# Patient Record
Sex: Female | Born: 1945 | Race: Black or African American | Hispanic: No | Smoking: Never smoker
Health system: Southern US, Community
[De-identification: ages and names within clinical notes are randomized; demographics above are authoritative.]

## PROBLEM LIST (undated history)

## (undated) DIAGNOSIS — T7840XA Allergy, unspecified, initial encounter: Secondary | ICD-10-CM

## (undated) DIAGNOSIS — M1990 Unspecified osteoarthritis, unspecified site: Secondary | ICD-10-CM

## (undated) DIAGNOSIS — Z87442 Personal history of urinary calculi: Secondary | ICD-10-CM

## (undated) DIAGNOSIS — M549 Dorsalgia, unspecified: Secondary | ICD-10-CM

## (undated) DIAGNOSIS — M069 Rheumatoid arthritis, unspecified: Secondary | ICD-10-CM

## (undated) DIAGNOSIS — G8929 Other chronic pain: Secondary | ICD-10-CM

## (undated) DIAGNOSIS — M810 Age-related osteoporosis without current pathological fracture: Secondary | ICD-10-CM

## (undated) HISTORY — PX: OTHER SURGICAL HISTORY: SHX169

## (undated) HISTORY — DX: Age-related osteoporosis without current pathological fracture: M81.0

## (undated) HISTORY — PX: NECK SURGERY: SHX720

## (undated) HISTORY — DX: Rheumatoid arthritis, unspecified: M06.9

## (undated) HISTORY — PX: EPIDURAL BLOCK INJECTION: SHX1516

## (undated) HISTORY — PX: ROTATOR CUFF REPAIR: SHX139

## (undated) HISTORY — DX: Other chronic pain: G89.29

## (undated) HISTORY — DX: Allergy, unspecified, initial encounter: T78.40XA

---
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
## 2006-03-31 ENCOUNTER — Ambulatory Visit: Payer: Self-pay | Admitting: Unknown Physician Specialty

## 2008-06-17 ENCOUNTER — Ambulatory Visit: Payer: Self-pay | Admitting: Unknown Physician Specialty

## 2008-06-24 ENCOUNTER — Ambulatory Visit: Payer: Self-pay | Admitting: Unknown Physician Specialty

## 2010-02-20 ENCOUNTER — Ambulatory Visit: Payer: Self-pay | Admitting: Rheumatology

## 2010-08-03 ENCOUNTER — Ambulatory Visit: Payer: Self-pay | Admitting: Rheumatology

## 2014-07-29 DIAGNOSIS — M810 Age-related osteoporosis without current pathological fracture: Secondary | ICD-10-CM | POA: Insufficient documentation

## 2014-07-29 DIAGNOSIS — M069 Rheumatoid arthritis, unspecified: Secondary | ICD-10-CM | POA: Insufficient documentation

## 2015-02-12 ENCOUNTER — Ambulatory Visit: Payer: Self-pay | Admitting: Podiatry

## 2016-01-07 ENCOUNTER — Ambulatory Visit: Payer: Medicare HMO | Attending: Rheumatology | Admitting: Occupational Therapy

## 2016-01-07 DIAGNOSIS — M25631 Stiffness of right wrist, not elsewhere classified: Secondary | ICD-10-CM | POA: Diagnosis present

## 2016-01-07 DIAGNOSIS — M79641 Pain in right hand: Secondary | ICD-10-CM | POA: Insufficient documentation

## 2016-01-07 DIAGNOSIS — M6281 Muscle weakness (generalized): Secondary | ICD-10-CM | POA: Insufficient documentation

## 2016-01-07 DIAGNOSIS — M25641 Stiffness of right hand, not elsewhere classified: Secondary | ICD-10-CM | POA: Insufficient documentation

## 2016-01-07 NOTE — Patient Instructions (Signed)
Pt was ed on using heat Tendon glides, THumb PA and RA Opposition picking up 1 cm foam block alternate digits RD of digits AROM of all - 2 x day - 8 reps   Pt ed on joint protection principles and AE - info and hand out provided

## 2016-01-07 NOTE — Therapy (Signed)
[facility] [address] [place], [address] Phone: [phone]   Fax:  [phone]  Occupational Therapy Treatment  Patient Details  Name: Casey [name] MRN: [id number] Date of Birth: [birth year] Referring Provider: Gavin Potters  Encounter Date: 01/07/2016      OT End of Session - 01/07/16 1759    Visit Number 1   Number of Visits 4   Date for OT Re-Evaluation 02/04/16   OT Start Time 1046   OT Stop Time 1144   OT Time Calculation (min) 58 min   Activity Tolerance Patient tolerated treatment well   Behavior During Therapy [facility] for tasks assessed/performed      No past medical history on file.  No past surgical history on file.  There were no vitals filed for this visit.  Visit Diagnosis:  Pain of right hand - Plan: Ot plan of care cert/re-cert  Stiffness of hand joint, right - Plan: Ot plan of care cert/re-cert  Muscle weakness - Plan: Ot plan of care cert/re-cert  Stiffness of right wrist joint - Plan: Ot plan of care cert/re-cert      Subjective Assessment - 01/07/16 1417    Subjective  I maybe had it more than 4 months - my husband just past away last month - pain in R wrist and knuckles and pins and needles in fingers tips - hard time opening my fingers, make fist   Patient Stated Goals Decrease pain , increase my motion in R hand             [facility] OT Assessment - 01/07/16 0001    Assessment   Diagnosis RA with R hand/wrist pain , CTS   Referring Provider Gavin Potters   Onset Date 08/29/15   Assessment Pt refer by DR Gavin Potters for OT - pt had R wrist and hand pain - CTS i nR -    Home  Environment   Lives With Alone   Prior Function   Level of Independence Independent   Leisure Pt retired, R hand dominant - husband was in rest home but past away about month ago - pt likes to  play games on phone, some yard work, house work, cooking, read and go to gym 3 x wk    Strength   Right Hand Grip  (lbs) 5   Right Hand Lateral Pinch 9 lbs   Right Hand 3 Point Pinch 6 lbs   Left Hand Grip (lbs) 25   Left Hand Lateral Pinch 12 lbs   Left Hand 3 Point Pinch 11 lbs   Right Hand AROM   R Thumb MCP 0-60 60 Degrees   R Thumb IP 0-80 50 Degrees   R Index  MCP 0-90 80 Degrees  -55   R Index PIP 0-100 80 Degrees   R Long  MCP 0-90 75 Degrees  -45   R Long PIP 0-100 82 Degrees   R Ring  MCP 0-90 70 Degrees  -45   R Ring PIP 0-100 85 Degrees   R Ring DIP 0-70 --   R Little  MCP 0-90 85 Degrees  -45   R Little PIP 0-100 80 Degrees   Left Hand AROM   L Thumb MCP 0-60 60 Degrees   L Thumb IP 0-80 75 Degrees   L Index  MCP 0-90 85 Degrees   L Index PIP 0-100 95 Degrees   L Long  MCP 0-90 85 Degrees   L Long PIP  0-100 95 Degrees   L Ring  MCP 0-90 88 Degrees   L Ring PIP 0-100 90 Degrees   L Little  MCP 0-90 85 Degrees   L Little PIP 0-100 95 Degrees          parafin use on R hand prior to review of HEP - decrease pain and pt show increase digits flexion and extention afterwards   ed on joint protection and AE                   OT Education - 01/07/16 1759    Education provided Yes   Education Details HEP and modifications of tasks   Person(s) Educated Patient   Methods Explanation;Demonstration;Tactile cues;Verbal cues;Handout   Comprehension Verbal cues required;Returned demonstration;Verbalized understanding          OT Short Term Goals - 01/07/16 1804    OT SHORT TERM GOAL #1   Title Pt pain on PRWHE improve with at least 15 points    Baseline Pain on PRWHE 40/50   Time 3   Period Weeks   Status New   OT SHORT TERM GOAL #2   Title Digits AROM in flexion and extention in R hand improve with 5-10 degrees to increase grip and able to put hand in pocket   Baseline See flowsheet   Time 3   Period Weeks   Status New           OT Long Term Goals - 01/07/16 1805    OT LONG TERM GOAL #1   Title Pt to be ind in HEP to increase ROM in R hand , wear  splint if needed to prevent worsening of subluxation of tendons over MC's no   Baseline no  knowledge    Time 4   Period Weeks   Status New   OT LONG TERM GOAL #2   Title Pt verbalize 3 joint protectoin principles and AE for pt to use to decrease pain and iincrease use of hands at home    Baseline very little knowledge    Time 4   Period Weeks   Status New   OT LONG TERM GOAL #3   Title Grip strengtht improve with 3-5 lbs  to increase use to cut food, write, use mouse on computer    Baseline Grip 5 lbs on R - and PRWHE at eval for funciton 19/50   Time 4   Period Weeks   Status New               Plan - 01/07/16 1800    Clinical Impression Statement Pt present with history of RA - R hand and wrist worse than L - pt present with increase pain in R hand more than L ,  decrease wrist ROM , decrease digits flexion and extention - subluxation of tendons over MC's on R and 2nd on L - decrease grip strength - and limited use of hand in ADL's and IADL's - pt do have  CTS on the R  and has brace to wear but not very complaint with it    Pt will benefit from skilled therapeutic intervention in order to improve on the following deficits (Retired) Decreased range of motion;Impaired flexibility;Impaired sensation;Decreased knowledge of use of DME;Impaired UE functional use;Pain;Decreased strength   Rehab Potential Fair   OT Frequency 1x / week   OT Duration 4 weeks   OT Treatment/Interventions Self-care/ADL training;Moist Heat;Parrafin;Therapeutic exercise;Patient/family education;Splinting;Manual Therapy;DME and/or AE instruction;Passive range of motion  Plan  assess if did modifications of task -AE and joint protection - and reviewed HEP   OT Home Exercise Plan see pt instruction    Consulted and Agree with Plan of Care Patient          G-Codes - Jan 21, 2016 1811    Functional Assessment Tool Used PRWHE , ROM , grip strenght , Pain , clincal judgement    Functional Limitation Self care    Self Care Current Status (J1884) At least 40 percent but less than 60 percent impaired, limited or restricted   Self Care Goal Status (Z6606) At least 20 percent but less than 40 percent impaired, limited or restricted      Problem List There are no active problems to display for this patient.   Oletta Cohn OTR/L,CLT Jan 21, 2016, 6:14 PM  [facility] [address], [place], [address] Phone: [phone]   Fax:  [phone]  Name: Casey [name] MRN: [id number] Date of Birth: 1946/07/14

## 2016-01-14 ENCOUNTER — Ambulatory Visit: Payer: Medicare HMO | Admitting: Occupational Therapy

## 2016-01-14 DIAGNOSIS — M79641 Pain in right hand: Secondary | ICD-10-CM

## 2016-01-14 DIAGNOSIS — M25631 Stiffness of right wrist, not elsewhere classified: Secondary | ICD-10-CM

## 2016-01-14 DIAGNOSIS — M25641 Stiffness of right hand, not elsewhere classified: Secondary | ICD-10-CM

## 2016-01-14 DIAGNOSIS — M6281 Muscle weakness (generalized): Secondary | ICD-10-CM

## 2016-01-14 NOTE — Therapy (Signed)
[facility] [address] [place], [address] Phone: [phone]   Fax:  [phone]  Occupational Therapy Treatment  Patient Details  Name: Casey [name] MRN: [id number] Date of Birth: May 17, 1946 Referring Provider: Gavin Potters  Encounter Date: 01/14/2016      OT End of Session - 01/14/16 1315    OT Start Time 1101   OT Stop Time 1143   OT Time Calculation (min) 42 min   Activity Tolerance Patient tolerated treatment well   Behavior During Therapy [facility] for tasks assessed/performed      No past medical history on file.  No past surgical history on file.  There were no vitals filed for this visit.  Visit Diagnosis:  Pain of right hand  Stiffness of hand joint, right  Muscle weakness  Stiffness of right wrist joint      Subjective Assessment - 01/14/16 1118    Subjective  Pain is better -but today about 5/10 - but I can tell my fingers move better - I can straighten it more and bend it better - I did warm water and then exercises - wrist still bother me    Patient Stated Goals Decrease pain , increase my motion in R hand    Currently in Pain? Yes   Pain Score 5    Pain Location Wrist   Pain Orientation Right   Pain Descriptors / Indicators Aching   Pain Onset More than a month ago            [facility] OT Assessment - 01/14/16 0001    Right Hand AROM   R Index  MCP 0-90 80 Degrees  -10   R Index PIP 0-100 90 Degrees   R Long  MCP 0-90 75 Degrees  0   R Long PIP 0-100 90 Degrees   R Ring  MCP 0-90 70 Degrees  0   R Ring PIP 0-100 90 Degrees   R Little  MCP 0-90 85 Degrees  0   R Little PIP 0-100 90 Degrees                  OT Treatments/Exercises (OP) - 01/14/16 0001    Wrist Exercises   Other wrist exercises Med N glide add to HEP - no change in sensation at tips when done    Hand Exercises   Other Hand Exercises Measurements taken - see flowsheet - when coming in -  Tapping of digits extention , RD of digits , tendon glides - oppostion    Other Hand Exercises --   RUE Paraffin   Number Minutes Paraffin 10 Minutes   RUE Paraffin Location Hand   Comments At [facility] to decrease pain and increase ROM prior to ROM    Manual Therapy   Manual therapy comments Soft tissue mobs to volar forearm and wrist - as well as over palm - with tool 2 and 4 Graston tools -                 OT Education - 01/14/16 1122    Education provided Yes   Education Details HEP and jooint protection    Person(s) Educated Patient   Methods Explanation;Demonstration;Tactile cues;Verbal cues   Comprehension Verbal cues required;Returned demonstration;Verbalized understanding          OT Short Term Goals - 01/14/16 1358    OT SHORT TERM GOAL #1   Title Pt pain on PRWHE improve with at least 15  points    Baseline Pain on PRWHE 40/50   Time 2   Period Weeks   Status On-going   OT SHORT TERM GOAL #2   Title Digits AROM in flexion and extention in R hand improve with 5-10 degrees to increase grip and able to put hand in pocket   Baseline See flowsheet   Status Achieved           OT Long Term Goals - 01/14/16 1359    OT LONG TERM GOAL #1   Title Pt to be ind in HEP to increase ROM in R hand , wear splint if needed to prevent worsening of subluxation of tendons over MC's no   Baseline prgressing    Time 3   Period Weeks   Status On-going   OT LONG TERM GOAL #2   Title Pt verbalize 3 joint protectoin principles and AE for pt to use to decrease pain and iincrease use of hands at home    Baseline progressing    Time 3   Period Weeks   Status On-going   OT LONG TERM GOAL #3   Title Grip strengtht improve with 3-5 lbs  to increase use to cut food, write, use mouse on computer    Baseline grip improve to 10 - but not assess if increase use    Time 3   Period Weeks   Status On-going               Plan - 01/14/16 1316    Clinical Impression Statement Pt  made great progress in digits extention coming in this date - and some progress in PIPI's - pt grip improve to 10 from 5 lbs at eval cont to have decrease wrist AROM and pain - but improvement in ROM , grip and pain -    Pt will benefit from skilled therapeutic intervention in order to improve on the following deficits (Retired) Decreased range of motion;Impaired flexibility;Impaired sensation;Decreased knowledge of use of DME;Impaired UE functional use;Pain;Decreased strength   Rehab Potential Fair   OT Frequency 1x / week   OT Duration 4 weeks   OT Treatment/Interventions Self-care/ADL training;Moist Heat;Parrafin;Therapeutic exercise;Patient/family education;Splinting;Manual Therapy;DME and/or AE instruction;Passive range of motion   Plan assess progress and pain - using joint proteciton    OT Home Exercise Plan see pt instruction    Consulted and Agree with Plan of Care Patient        Problem List There are no active problems to display for this patient.   Oletta Cohn OTR/L,CLT  01/14/2016, 2:01 PM  [facility] SPORTS MEDICINE [address], [place], [address] Phone: [phone]   Fax:  [phone]  Name: ALLISYN KUNZ MRN: [id number] Date of Birth: 02/25/1946

## 2016-01-14 NOTE — Patient Instructions (Signed)
Same as before - but add Med N glide  And reinforce implementing joint protection and AE use to decrease degeneration of joints - and further subluxation of MC's

## 2016-01-21 ENCOUNTER — Ambulatory Visit: Payer: Medicare HMO | Admitting: Occupational Therapy

## 2016-01-21 DIAGNOSIS — M6281 Muscle weakness (generalized): Secondary | ICD-10-CM

## 2016-01-21 DIAGNOSIS — M79641 Pain in right hand: Secondary | ICD-10-CM

## 2016-01-21 DIAGNOSIS — M25641 Stiffness of right hand, not elsewhere classified: Secondary | ICD-10-CM

## 2016-01-21 NOTE — Therapy (Signed)
[place] [facility] [address], [place], [address] Phone: [phone]   Fax:  [phone]  Occupational Therapy Treatment  Patient Details  Name: Casey [name] MRN: [id number] Date of Birth: 1946/04/01 Referring Provider: Jefm Bryant  Encounter Date: 01/21/2016      OT End of Session - 01/21/16 1635    Visit Number 3   Number of Visits 3   Date for OT Re-Evaluation 01/21/16   OT Start Time 5929   OT Stop Time 1633   OT Time Calculation (min) 45 min   Activity Tolerance Patient tolerated treatment well   Behavior During Therapy [facility] for tasks assessed/performed      No past medical history on file.  No past surgical history on file.  There were no vitals filed for this visit.  Visit Diagnosis:  Pain of right hand  Stiffness of hand joint, right  Muscle weakness      Subjective Assessment - 01/21/16 1607    Subjective  Fingers are more straight - wrist better today - think it is the warmer weather - still some numbness but lower - I try and use my hands different    Patient Stated Goals Decrease pain , increase my motion in R hand    Currently in Pain? No/denies            [facility] OT Assessment - 01/21/16 0001    Strength   Right Hand Grip (lbs) 15   Left Hand Grip (lbs) 38   Right Hand AROM   R Index  MCP 0-90 90 Degrees   R Index PIP 0-100 95 Degrees   R Long  MCP 0-90 85 Degrees   R Long PIP 0-100 90 Degrees   R Ring  MCP 0-90 90 Degrees   R Ring PIP 0-100 95 Degrees   R Little  MCP 0-90 80 Degrees   R Little PIP 0-100 90 Degrees                  OT Treatments/Exercises (OP) - 01/21/16 0001    ADLs   ADL Comments PRWHE for pain done    Wrist Exercises   Other wrist exercises gentle PROM for wrist extetntion - but not progress    Other wrist exercises MEd N glides - pt was doing last step wrong - reviewed 5 reps    Hand Exercises   Other Hand Exercises Reviewed HEP for  tendon glides , RD of digits - and tapping  - unable to do 5th    Other Hand Exercises see flowsheet  for grip strength    RUE Paraffin   Number Minutes Paraffin 10 Minutes   RUE Paraffin Location Hand   Comments at [facility] to increase motions nad decreasepain    Splinting   Splinting Fitted with wrist splint to use for CT at night time , driving , computer - that is when numbness happend - pt report feels muc hbetter                 OT Education - 01/21/16 1635    Education provided Yes   Education Details HEP   Person(s) Educated Patient   Methods Explanation;Demonstration;Tactile cues;Verbal cues   Comprehension Verbal cues required;Returned demonstration;Verbalized understanding          OT Short Term Goals - 01/21/16 1630    OT SHORT TERM GOAL #1   Title Pt pain on PRWHE improve with at least 15 points  Baseline Pain improve to 23/50; was 40/50 at eval    Status Achieved   OT SHORT TERM GOAL #2   Title Digits AROM in flexion and extention in R hand improve with 5-10 degrees to increase grip and able to put hand in pocket   Status Achieved           OT Long Term Goals - 01/21/16 1632    OT LONG TERM GOAL #1   Title Pt to be ind in HEP to increase ROM in R hand , wear splint if needed to prevent worsening of subluxation of tendons over MC's no   Status Achieved   OT LONG TERM GOAL #2   Title Pt verbalize 3 joint protectoin principles and AE for pt to use to decrease pain and iincrease use of hands at home    Status Achieved   OT LONG TERM GOAL #3   Title Grip strengtht improve with 3-5 lbs  to increase use to cut food, write, use mouse on computer    Status Achieved               Plan - 01/21/16 1848    Clinical Impression Statement Pt made great progress in pain , ROM for R hand digits extention - as well as grip strength in bilateral hands - pt ed on joint protection principles and modifications of taks - pt met all goals and discharge with home  program to prevent further suluxation of digits , maintain and increase ROM    Pt will benefit from skilled therapeutic intervention in order to improve on the following deficits (Retired) Decreased range of motion;Impaired flexibility;Impaired sensation;Decreased knowledge of use of DME;Impaired UE functional use;Pain;Decreased strength   Rehab Potential Fair   OT Treatment/Interventions Self-care/ADL training;Moist Heat;Parrafin;Therapeutic exercise;Patient/family education;Splinting;Manual Therapy;DME and/or AE instruction;Passive range of motion   Plan discharge with Home program    OT Home Exercise Plan see pt instruction    Consulted and Agree with Plan of Care Patient        Problem List There are no active problems to display for this patient.   Rosalyn Gess  OTR/L,CLT   01/21/2016, 6:50 PM  [place] [facility] [address], [place], [address] Phone: [phone]   Fax:  [phone]  Name: Casey [name] MRN: [id number] Date of Birth: [birth year]

## 2016-01-21 NOTE — Patient Instructions (Signed)
Same HEP and cont with joint protection and modifcations -AE   Splint for R wrist - computer , driving , sleeping

## 2016-06-22 ENCOUNTER — Other Ambulatory Visit: Payer: Self-pay | Admitting: Rheumatology

## 2016-06-22 DIAGNOSIS — M545 Low back pain: Secondary | ICD-10-CM

## 2016-07-02 ENCOUNTER — Ambulatory Visit
Admission: RE | Admit: 2016-07-02 | Discharge: 2016-07-02 | Disposition: A | Discharge status: Home / Self Care | Payer: Medicare HMO | Source: Ambulatory Visit | Attending: Rheumatology | Admitting: Rheumatology

## 2016-07-02 ENCOUNTER — Encounter: Payer: Self-pay | Admitting: Radiology

## 2016-07-02 DIAGNOSIS — M47816 Spondylosis without myelopathy or radiculopathy, lumbar region: Secondary | ICD-10-CM | POA: Insufficient documentation

## 2016-07-02 DIAGNOSIS — M545 Low back pain: Secondary | ICD-10-CM | POA: Diagnosis present

## 2016-07-02 DIAGNOSIS — M5126 Other intervertebral disc displacement, lumbar region: Secondary | ICD-10-CM | POA: Diagnosis not present

## 2016-07-02 DIAGNOSIS — M4806 Spinal stenosis, lumbar region: Secondary | ICD-10-CM | POA: Insufficient documentation

## 2017-05-09 DIAGNOSIS — M7062 Trochanteric bursitis, left hip: Secondary | ICD-10-CM | POA: Diagnosis not present

## 2017-05-09 DIAGNOSIS — M25552 Pain in left hip: Secondary | ICD-10-CM | POA: Diagnosis not present

## 2017-05-09 DIAGNOSIS — M25562 Pain in left knee: Secondary | ICD-10-CM | POA: Diagnosis not present

## 2017-06-07 DIAGNOSIS — M940 Chondrocostal junction syndrome [Tietze]: Secondary | ICD-10-CM | POA: Diagnosis not present

## 2017-06-07 DIAGNOSIS — R079 Chest pain, unspecified: Secondary | ICD-10-CM | POA: Diagnosis not present

## 2017-06-26 DIAGNOSIS — Z79899 Other long term (current) drug therapy: Secondary | ICD-10-CM | POA: Diagnosis not present

## 2017-06-26 DIAGNOSIS — M0579 Rheumatoid arthritis with rheumatoid factor of multiple sites without organ or systems involvement: Secondary | ICD-10-CM | POA: Diagnosis not present

## 2017-08-17 DIAGNOSIS — Z79899 Other long term (current) drug therapy: Secondary | ICD-10-CM | POA: Diagnosis not present

## 2017-08-17 DIAGNOSIS — Z8669 Personal history of other diseases of the nervous system and sense organs: Secondary | ICD-10-CM | POA: Diagnosis not present

## 2017-08-17 DIAGNOSIS — Z791 Long term (current) use of non-steroidal anti-inflammatories (NSAID): Secondary | ICD-10-CM | POA: Diagnosis not present

## 2017-08-17 DIAGNOSIS — M0579 Rheumatoid arthritis with rheumatoid factor of multiple sites without organ or systems involvement: Secondary | ICD-10-CM | POA: Diagnosis not present

## 2017-08-17 DIAGNOSIS — Z972 Presence of dental prosthetic device (complete) (partial): Secondary | ICD-10-CM | POA: Diagnosis not present

## 2017-08-17 DIAGNOSIS — Z0000 Encounter for general adult medical examination without abnormal findings: Secondary | ICD-10-CM | POA: Diagnosis not present

## 2017-08-17 DIAGNOSIS — K08109 Complete loss of teeth, unspecified cause, unspecified class: Secondary | ICD-10-CM | POA: Diagnosis not present

## 2017-08-17 DIAGNOSIS — Z7952 Long term (current) use of systemic steroids: Secondary | ICD-10-CM | POA: Diagnosis not present

## 2017-08-17 DIAGNOSIS — Z7983 Long term (current) use of bisphosphonates: Secondary | ICD-10-CM | POA: Diagnosis not present

## 2017-08-17 DIAGNOSIS — M818 Other osteoporosis without current pathological fracture: Secondary | ICD-10-CM | POA: Diagnosis not present

## 2017-08-25 DIAGNOSIS — M533 Sacrococcygeal disorders, not elsewhere classified: Secondary | ICD-10-CM | POA: Diagnosis not present

## 2017-09-05 DIAGNOSIS — K5730 Diverticulosis of large intestine without perforation or abscess without bleeding: Secondary | ICD-10-CM | POA: Diagnosis not present

## 2017-09-05 DIAGNOSIS — Z86010 Personal history of colonic polyps: Secondary | ICD-10-CM | POA: Diagnosis not present

## 2017-09-05 DIAGNOSIS — K5520 Angiodysplasia of colon without hemorrhage: Secondary | ICD-10-CM | POA: Diagnosis not present

## 2017-09-13 DIAGNOSIS — M0579 Rheumatoid arthritis with rheumatoid factor of multiple sites without organ or systems involvement: Secondary | ICD-10-CM | POA: Diagnosis not present

## 2017-09-13 DIAGNOSIS — Z79899 Other long term (current) drug therapy: Secondary | ICD-10-CM | POA: Diagnosis not present

## 2017-09-19 DIAGNOSIS — G8929 Other chronic pain: Secondary | ICD-10-CM | POA: Diagnosis not present

## 2017-09-19 DIAGNOSIS — M545 Low back pain: Secondary | ICD-10-CM | POA: Diagnosis not present

## 2017-09-19 DIAGNOSIS — M0579 Rheumatoid arthritis with rheumatoid factor of multiple sites without organ or systems involvement: Secondary | ICD-10-CM | POA: Diagnosis not present

## 2017-09-19 DIAGNOSIS — M25562 Pain in left knee: Secondary | ICD-10-CM | POA: Diagnosis not present

## 2017-12-21 DIAGNOSIS — M0579 Rheumatoid arthritis with rheumatoid factor of multiple sites without organ or systems involvement: Secondary | ICD-10-CM | POA: Diagnosis not present

## 2018-02-26 DIAGNOSIS — 419620001 Death: Secondary | SNOMED CT | POA: Diagnosis not present

## 2018-02-26 DEATH — deceased

## 2018-03-13 DIAGNOSIS — Z1231 Encounter for screening mammogram for malignant neoplasm of breast: Secondary | ICD-10-CM | POA: Diagnosis not present

## 2018-04-03 DIAGNOSIS — M0579 Rheumatoid arthritis with rheumatoid factor of multiple sites without organ or systems involvement: Secondary | ICD-10-CM | POA: Diagnosis not present

## 2018-04-05 DIAGNOSIS — G8929 Other chronic pain: Secondary | ICD-10-CM | POA: Diagnosis not present

## 2018-04-05 DIAGNOSIS — M25552 Pain in left hip: Secondary | ICD-10-CM | POA: Diagnosis not present

## 2018-04-05 DIAGNOSIS — M25562 Pain in left knee: Secondary | ICD-10-CM | POA: Diagnosis not present

## 2018-04-05 DIAGNOSIS — M0579 Rheumatoid arthritis with rheumatoid factor of multiple sites without organ or systems involvement: Secondary | ICD-10-CM | POA: Diagnosis not present

## 2018-04-05 DIAGNOSIS — Z79899 Other long term (current) drug therapy: Secondary | ICD-10-CM | POA: Diagnosis not present

## 2018-05-23 DIAGNOSIS — K644 Residual hemorrhoidal skin tags: Secondary | ICD-10-CM | POA: Diagnosis not present

## 2018-05-23 DIAGNOSIS — M069 Rheumatoid arthritis, unspecified: Secondary | ICD-10-CM | POA: Diagnosis not present

## 2018-05-24 DIAGNOSIS — Z79899 Other long term (current) drug therapy: Secondary | ICD-10-CM | POA: Diagnosis not present

## 2018-05-24 DIAGNOSIS — Z791 Long term (current) use of non-steroidal anti-inflammatories (NSAID): Secondary | ICD-10-CM | POA: Diagnosis not present

## 2018-05-24 DIAGNOSIS — Z7952 Long term (current) use of systemic steroids: Secondary | ICD-10-CM | POA: Diagnosis not present

## 2018-05-24 DIAGNOSIS — G8929 Other chronic pain: Secondary | ICD-10-CM | POA: Diagnosis not present

## 2018-05-24 DIAGNOSIS — R69 Illness, unspecified: Secondary | ICD-10-CM | POA: Diagnosis not present

## 2018-05-24 DIAGNOSIS — Z809 Family history of malignant neoplasm, unspecified: Secondary | ICD-10-CM | POA: Diagnosis not present

## 2018-05-24 DIAGNOSIS — M069 Rheumatoid arthritis, unspecified: Secondary | ICD-10-CM | POA: Diagnosis not present

## 2018-07-03 DIAGNOSIS — H903 Sensorineural hearing loss, bilateral: Secondary | ICD-10-CM | POA: Diagnosis not present

## 2018-07-03 DIAGNOSIS — H6122 Impacted cerumen, left ear: Secondary | ICD-10-CM | POA: Diagnosis not present

## 2018-07-15 DIAGNOSIS — M549 Dorsalgia, unspecified: Secondary | ICD-10-CM | POA: Diagnosis not present

## 2018-07-15 DIAGNOSIS — S52515A Nondisplaced fracture of left radial styloid process, initial encounter for closed fracture: Secondary | ICD-10-CM | POA: Diagnosis not present

## 2018-07-15 DIAGNOSIS — S3991XA Unspecified injury of abdomen, initial encounter: Secondary | ICD-10-CM | POA: Diagnosis not present

## 2018-07-15 DIAGNOSIS — R918 Other nonspecific abnormal finding of lung field: Secondary | ICD-10-CM | POA: Diagnosis not present

## 2018-07-15 DIAGNOSIS — R079 Chest pain, unspecified: Secondary | ICD-10-CM | POA: Diagnosis not present

## 2018-07-15 DIAGNOSIS — M25532 Pain in left wrist: Secondary | ICD-10-CM | POA: Diagnosis not present

## 2018-07-15 DIAGNOSIS — S63071A Subluxation of distal end of right ulna, initial encounter: Secondary | ICD-10-CM | POA: Diagnosis not present

## 2018-07-15 DIAGNOSIS — S299XXA Unspecified injury of thorax, initial encounter: Secondary | ICD-10-CM | POA: Diagnosis not present

## 2018-07-15 DIAGNOSIS — S52511D Displaced fracture of right radial styloid process, subsequent encounter for closed fracture with routine healing: Secondary | ICD-10-CM | POA: Diagnosis not present

## 2018-07-15 DIAGNOSIS — S79912A Unspecified injury of left hip, initial encounter: Secondary | ICD-10-CM | POA: Diagnosis not present

## 2018-07-15 DIAGNOSIS — M25542 Pain in joints of left hand: Secondary | ICD-10-CM | POA: Diagnosis not present

## 2018-07-15 DIAGNOSIS — S022XXA Fracture of nasal bones, initial encounter for closed fracture: Secondary | ICD-10-CM | POA: Diagnosis not present

## 2018-07-15 DIAGNOSIS — Y33XXXA Other specified events, undetermined intent, initial encounter: Secondary | ICD-10-CM | POA: Diagnosis not present

## 2018-07-15 DIAGNOSIS — S3993XA Unspecified injury of pelvis, initial encounter: Secondary | ICD-10-CM | POA: Diagnosis not present

## 2018-07-15 DIAGNOSIS — M25531 Pain in right wrist: Secondary | ICD-10-CM | POA: Diagnosis not present

## 2018-07-15 DIAGNOSIS — S0081XA Abrasion of other part of head, initial encounter: Secondary | ICD-10-CM | POA: Diagnosis not present

## 2018-07-15 DIAGNOSIS — M542 Cervicalgia: Secondary | ICD-10-CM | POA: Diagnosis not present

## 2018-07-15 DIAGNOSIS — M7989 Other specified soft tissue disorders: Secondary | ICD-10-CM | POA: Diagnosis not present

## 2018-07-15 DIAGNOSIS — S52514A Nondisplaced fracture of right radial styloid process, initial encounter for closed fracture: Secondary | ICD-10-CM | POA: Diagnosis not present

## 2018-07-15 DIAGNOSIS — S0990XA Unspecified injury of head, initial encounter: Secondary | ICD-10-CM | POA: Diagnosis not present

## 2018-07-15 DIAGNOSIS — M546 Pain in thoracic spine: Secondary | ICD-10-CM | POA: Diagnosis not present

## 2018-07-15 DIAGNOSIS — J9811 Atelectasis: Secondary | ICD-10-CM | POA: Diagnosis not present

## 2018-07-16 DIAGNOSIS — T1490XA Injury, unspecified, initial encounter: Secondary | ICD-10-CM | POA: Diagnosis not present

## 2018-07-16 DIAGNOSIS — S52514A Nondisplaced fracture of right radial styloid process, initial encounter for closed fracture: Secondary | ICD-10-CM | POA: Diagnosis not present

## 2018-07-16 DIAGNOSIS — S299XXA Unspecified injury of thorax, initial encounter: Secondary | ICD-10-CM | POA: Diagnosis not present

## 2018-07-16 DIAGNOSIS — E119 Type 2 diabetes mellitus without complications: Secondary | ICD-10-CM | POA: Diagnosis not present

## 2018-07-16 DIAGNOSIS — M769 Unspecified enthesopathy, lower limb, excluding foot: Secondary | ICD-10-CM | POA: Diagnosis not present

## 2018-07-16 DIAGNOSIS — M25531 Pain in right wrist: Secondary | ICD-10-CM | POA: Diagnosis not present

## 2018-07-16 DIAGNOSIS — S022XXA Fracture of nasal bones, initial encounter for closed fracture: Secondary | ICD-10-CM | POA: Diagnosis not present

## 2018-07-16 DIAGNOSIS — M542 Cervicalgia: Secondary | ICD-10-CM | POA: Diagnosis not present

## 2018-07-16 DIAGNOSIS — M25542 Pain in joints of left hand: Secondary | ICD-10-CM | POA: Diagnosis not present

## 2018-07-16 DIAGNOSIS — R918 Other nonspecific abnormal finding of lung field: Secondary | ICD-10-CM | POA: Diagnosis not present

## 2018-07-16 DIAGNOSIS — S069X1A Unspecified intracranial injury with loss of consciousness of 30 minutes or less, initial encounter: Secondary | ICD-10-CM | POA: Diagnosis not present

## 2018-07-16 DIAGNOSIS — S63071A Subluxation of distal end of right ulna, initial encounter: Secondary | ICD-10-CM | POA: Diagnosis not present

## 2018-07-16 DIAGNOSIS — S3993XA Unspecified injury of pelvis, initial encounter: Secondary | ICD-10-CM | POA: Diagnosis not present

## 2018-07-16 DIAGNOSIS — M810 Age-related osteoporosis without current pathological fracture: Secondary | ICD-10-CM | POA: Diagnosis not present

## 2018-07-16 DIAGNOSIS — M0579 Rheumatoid arthritis with rheumatoid factor of multiple sites without organ or systems involvement: Secondary | ICD-10-CM | POA: Diagnosis not present

## 2018-07-16 DIAGNOSIS — S0081XA Abrasion of other part of head, initial encounter: Secondary | ICD-10-CM | POA: Diagnosis not present

## 2018-07-16 DIAGNOSIS — M069 Rheumatoid arthritis, unspecified: Secondary | ICD-10-CM | POA: Diagnosis not present

## 2018-07-16 DIAGNOSIS — J9811 Atelectasis: Secondary | ICD-10-CM | POA: Diagnosis not present

## 2018-07-16 DIAGNOSIS — Y33XXXA Other specified events, undetermined intent, initial encounter: Secondary | ICD-10-CM | POA: Diagnosis not present

## 2018-07-16 DIAGNOSIS — M7120 Synovial cyst of popliteal space [Baker], unspecified knee: Secondary | ICD-10-CM | POA: Diagnosis not present

## 2018-07-16 DIAGNOSIS — M25532 Pain in left wrist: Secondary | ICD-10-CM | POA: Diagnosis not present

## 2018-07-16 DIAGNOSIS — S3992XA Unspecified injury of lower back, initial encounter: Secondary | ICD-10-CM | POA: Diagnosis not present

## 2018-07-16 DIAGNOSIS — S79912A Unspecified injury of left hip, initial encounter: Secondary | ICD-10-CM | POA: Diagnosis not present

## 2018-07-16 DIAGNOSIS — S52515A Nondisplaced fracture of left radial styloid process, initial encounter for closed fracture: Secondary | ICD-10-CM | POA: Diagnosis not present

## 2018-07-16 DIAGNOSIS — M546 Pain in thoracic spine: Secondary | ICD-10-CM | POA: Diagnosis not present

## 2018-07-16 DIAGNOSIS — M7989 Other specified soft tissue disorders: Secondary | ICD-10-CM | POA: Diagnosis not present

## 2018-07-16 DIAGNOSIS — S52511D Displaced fracture of right radial styloid process, subsequent encounter for closed fracture with routine healing: Secondary | ICD-10-CM | POA: Diagnosis not present

## 2018-07-16 DIAGNOSIS — S0990XA Unspecified injury of head, initial encounter: Secondary | ICD-10-CM | POA: Diagnosis not present

## 2018-07-16 DIAGNOSIS — R079 Chest pain, unspecified: Secondary | ICD-10-CM | POA: Diagnosis not present

## 2018-07-16 DIAGNOSIS — S3991XA Unspecified injury of abdomen, initial encounter: Secondary | ICD-10-CM | POA: Diagnosis not present

## 2018-07-17 DIAGNOSIS — T1490XA Injury, unspecified, initial encounter: Secondary | ICD-10-CM | POA: Diagnosis not present

## 2018-07-17 DIAGNOSIS — M0579 Rheumatoid arthritis with rheumatoid factor of multiple sites without organ or systems involvement: Secondary | ICD-10-CM | POA: Diagnosis not present

## 2018-07-18 DIAGNOSIS — M0579 Rheumatoid arthritis with rheumatoid factor of multiple sites without organ or systems involvement: Secondary | ICD-10-CM | POA: Diagnosis not present

## 2018-07-18 DIAGNOSIS — T1490XA Injury, unspecified, initial encounter: Secondary | ICD-10-CM | POA: Diagnosis not present

## 2018-07-24 DIAGNOSIS — M069 Rheumatoid arthritis, unspecified: Secondary | ICD-10-CM | POA: Diagnosis not present

## 2018-07-24 DIAGNOSIS — Z888 Allergy status to other drugs, medicaments and biological substances status: Secondary | ICD-10-CM | POA: Diagnosis not present

## 2018-07-24 DIAGNOSIS — M25571 Pain in right ankle and joints of right foot: Secondary | ICD-10-CM | POA: Diagnosis not present

## 2018-07-24 DIAGNOSIS — Z79899 Other long term (current) drug therapy: Secondary | ICD-10-CM | POA: Diagnosis not present

## 2018-07-24 DIAGNOSIS — K644 Residual hemorrhoidal skin tags: Secondary | ICD-10-CM | POA: Diagnosis not present

## 2018-07-24 DIAGNOSIS — M7989 Other specified soft tissue disorders: Secondary | ICD-10-CM | POA: Diagnosis not present

## 2018-07-24 DIAGNOSIS — H25813 Combined forms of age-related cataract, bilateral: Secondary | ICD-10-CM | POA: Diagnosis not present

## 2018-07-24 DIAGNOSIS — S8011XA Contusion of right lower leg, initial encounter: Secondary | ICD-10-CM | POA: Diagnosis not present

## 2018-07-24 DIAGNOSIS — M1990 Unspecified osteoarthritis, unspecified site: Secondary | ICD-10-CM | POA: Diagnosis not present

## 2018-07-24 DIAGNOSIS — S8011XD Contusion of right lower leg, subsequent encounter: Secondary | ICD-10-CM | POA: Diagnosis not present

## 2018-07-25 DIAGNOSIS — M7989 Other specified soft tissue disorders: Secondary | ICD-10-CM | POA: Diagnosis not present

## 2018-07-25 DIAGNOSIS — R58 Hemorrhage, not elsewhere classified: Secondary | ICD-10-CM | POA: Diagnosis not present

## 2018-07-25 DIAGNOSIS — Z79899 Other long term (current) drug therapy: Secondary | ICD-10-CM | POA: Diagnosis not present

## 2018-07-25 DIAGNOSIS — M069 Rheumatoid arthritis, unspecified: Secondary | ICD-10-CM | POA: Diagnosis not present

## 2018-07-25 DIAGNOSIS — M79661 Pain in right lower leg: Secondary | ICD-10-CM | POA: Diagnosis not present

## 2018-07-25 DIAGNOSIS — M7981 Nontraumatic hematoma of soft tissue: Secondary | ICD-10-CM | POA: Diagnosis not present

## 2018-08-01 DIAGNOSIS — M25531 Pain in right wrist: Secondary | ICD-10-CM | POA: Diagnosis not present

## 2018-08-01 DIAGNOSIS — S52514A Nondisplaced fracture of right radial styloid process, initial encounter for closed fracture: Secondary | ICD-10-CM | POA: Diagnosis not present

## 2018-08-06 DIAGNOSIS — S52514A Nondisplaced fracture of right radial styloid process, initial encounter for closed fracture: Secondary | ICD-10-CM | POA: Insufficient documentation

## 2018-08-06 HISTORY — DX: Nondisplaced fracture of right radial styloid process, initial encounter for closed fracture: S52.514A

## 2018-08-09 DIAGNOSIS — S52514A Nondisplaced fracture of right radial styloid process, initial encounter for closed fracture: Secondary | ICD-10-CM | POA: Diagnosis not present

## 2018-08-24 DIAGNOSIS — H25811 Combined forms of age-related cataract, right eye: Secondary | ICD-10-CM | POA: Diagnosis not present

## 2018-08-28 DIAGNOSIS — S52514A Nondisplaced fracture of right radial styloid process, initial encounter for closed fracture: Secondary | ICD-10-CM | POA: Diagnosis not present

## 2018-10-02 DIAGNOSIS — Z79899 Other long term (current) drug therapy: Secondary | ICD-10-CM | POA: Diagnosis not present

## 2018-10-02 DIAGNOSIS — M542 Cervicalgia: Secondary | ICD-10-CM | POA: Diagnosis not present

## 2018-10-02 DIAGNOSIS — M0579 Rheumatoid arthritis with rheumatoid factor of multiple sites without organ or systems involvement: Secondary | ICD-10-CM | POA: Diagnosis not present

## 2018-10-04 DIAGNOSIS — M542 Cervicalgia: Secondary | ICD-10-CM | POA: Diagnosis not present

## 2018-10-04 DIAGNOSIS — Z79899 Other long term (current) drug therapy: Secondary | ICD-10-CM | POA: Diagnosis not present

## 2018-10-04 DIAGNOSIS — M0579 Rheumatoid arthritis with rheumatoid factor of multiple sites without organ or systems involvement: Secondary | ICD-10-CM | POA: Diagnosis not present

## 2018-10-04 DIAGNOSIS — M50321 Other cervical disc degeneration at C4-C5 level: Secondary | ICD-10-CM | POA: Diagnosis not present

## 2018-10-04 DIAGNOSIS — G8929 Other chronic pain: Secondary | ICD-10-CM | POA: Diagnosis not present

## 2018-10-04 DIAGNOSIS — M25562 Pain in left knee: Secondary | ICD-10-CM | POA: Diagnosis not present

## 2018-12-27 DIAGNOSIS — B300 Keratoconjunctivitis due to adenovirus: Secondary | ICD-10-CM | POA: Diagnosis not present

## 2019-01-02 DIAGNOSIS — H6122 Impacted cerumen, left ear: Secondary | ICD-10-CM | POA: Diagnosis not present

## 2019-01-02 DIAGNOSIS — H9190 Unspecified hearing loss, unspecified ear: Secondary | ICD-10-CM | POA: Diagnosis not present

## 2019-01-07 DIAGNOSIS — Z79899 Other long term (current) drug therapy: Secondary | ICD-10-CM | POA: Diagnosis not present

## 2019-01-07 DIAGNOSIS — M0579 Rheumatoid arthritis with rheumatoid factor of multiple sites without organ or systems involvement: Secondary | ICD-10-CM | POA: Diagnosis not present

## 2019-01-28 DIAGNOSIS — H10023 Other mucopurulent conjunctivitis, bilateral: Secondary | ICD-10-CM | POA: Diagnosis not present

## 2019-01-30 DIAGNOSIS — J019 Acute sinusitis, unspecified: Secondary | ICD-10-CM | POA: Diagnosis not present

## 2019-03-28 DIAGNOSIS — M0579 Rheumatoid arthritis with rheumatoid factor of multiple sites without organ or systems involvement: Secondary | ICD-10-CM | POA: Diagnosis not present

## 2019-03-28 DIAGNOSIS — Z79899 Other long term (current) drug therapy: Secondary | ICD-10-CM | POA: Diagnosis not present

## 2019-04-04 DIAGNOSIS — M0579 Rheumatoid arthritis with rheumatoid factor of multiple sites without organ or systems involvement: Secondary | ICD-10-CM | POA: Diagnosis not present

## 2019-04-18 DIAGNOSIS — R201 Hypoesthesia of skin: Secondary | ICD-10-CM | POA: Diagnosis not present

## 2019-04-18 DIAGNOSIS — R200 Anesthesia of skin: Secondary | ICD-10-CM | POA: Diagnosis not present

## 2019-04-18 DIAGNOSIS — R600 Localized edema: Secondary | ICD-10-CM | POA: Diagnosis not present

## 2019-04-24 DIAGNOSIS — R600 Localized edema: Secondary | ICD-10-CM | POA: Diagnosis not present

## 2019-04-25 DIAGNOSIS — M0579 Rheumatoid arthritis with rheumatoid factor of multiple sites without organ or systems involvement: Secondary | ICD-10-CM | POA: Diagnosis not present

## 2019-04-25 DIAGNOSIS — M7062 Trochanteric bursitis, left hip: Secondary | ICD-10-CM | POA: Diagnosis not present

## 2019-04-25 DIAGNOSIS — M25552 Pain in left hip: Secondary | ICD-10-CM | POA: Diagnosis not present

## 2019-04-25 DIAGNOSIS — Z79899 Other long term (current) drug therapy: Secondary | ICD-10-CM | POA: Diagnosis not present

## 2019-04-25 DIAGNOSIS — G8929 Other chronic pain: Secondary | ICD-10-CM | POA: Diagnosis not present

## 2019-04-30 DIAGNOSIS — M7062 Trochanteric bursitis, left hip: Secondary | ICD-10-CM | POA: Diagnosis not present

## 2019-04-30 DIAGNOSIS — M545 Low back pain: Secondary | ICD-10-CM | POA: Diagnosis not present

## 2019-05-03 DIAGNOSIS — M419 Scoliosis, unspecified: Secondary | ICD-10-CM | POA: Diagnosis not present

## 2019-05-03 DIAGNOSIS — Z79899 Other long term (current) drug therapy: Secondary | ICD-10-CM | POA: Diagnosis not present

## 2019-05-03 DIAGNOSIS — M5126 Other intervertebral disc displacement, lumbar region: Secondary | ICD-10-CM | POA: Diagnosis not present

## 2019-05-03 DIAGNOSIS — M069 Rheumatoid arthritis, unspecified: Secondary | ICD-10-CM | POA: Diagnosis not present

## 2019-05-03 DIAGNOSIS — M7060 Trochanteric bursitis, unspecified hip: Secondary | ICD-10-CM | POA: Diagnosis not present

## 2019-05-03 DIAGNOSIS — M545 Low back pain: Secondary | ICD-10-CM | POA: Diagnosis not present

## 2019-05-03 DIAGNOSIS — M5416 Radiculopathy, lumbar region: Secondary | ICD-10-CM | POA: Diagnosis not present

## 2019-05-03 DIAGNOSIS — I10 Essential (primary) hypertension: Secondary | ICD-10-CM | POA: Diagnosis not present

## 2019-05-03 DIAGNOSIS — R200 Anesthesia of skin: Secondary | ICD-10-CM | POA: Diagnosis not present

## 2019-05-03 DIAGNOSIS — M48061 Spinal stenosis, lumbar region without neurogenic claudication: Secondary | ICD-10-CM | POA: Diagnosis not present

## 2019-05-03 DIAGNOSIS — Z888 Allergy status to other drugs, medicaments and biological substances status: Secondary | ICD-10-CM | POA: Diagnosis not present

## 2019-05-03 DIAGNOSIS — R000 Tachycardia, unspecified: Secondary | ICD-10-CM | POA: Diagnosis not present

## 2019-05-04 DIAGNOSIS — M419 Scoliosis, unspecified: Secondary | ICD-10-CM | POA: Diagnosis not present

## 2019-05-04 DIAGNOSIS — M5126 Other intervertebral disc displacement, lumbar region: Secondary | ICD-10-CM | POA: Diagnosis not present

## 2019-05-04 DIAGNOSIS — M069 Rheumatoid arthritis, unspecified: Secondary | ICD-10-CM | POA: Diagnosis not present

## 2019-05-04 DIAGNOSIS — M5416 Radiculopathy, lumbar region: Secondary | ICD-10-CM | POA: Diagnosis not present

## 2019-05-04 DIAGNOSIS — M1990 Unspecified osteoarthritis, unspecified site: Secondary | ICD-10-CM | POA: Diagnosis not present

## 2019-05-04 DIAGNOSIS — R102 Pelvic and perineal pain: Secondary | ICD-10-CM | POA: Diagnosis not present

## 2019-05-04 DIAGNOSIS — M48061 Spinal stenosis, lumbar region without neurogenic claudication: Secondary | ICD-10-CM | POA: Diagnosis not present

## 2019-05-04 DIAGNOSIS — Z9289 Personal history of other medical treatment: Secondary | ICD-10-CM | POA: Diagnosis not present

## 2019-05-04 DIAGNOSIS — M545 Low back pain: Secondary | ICD-10-CM | POA: Diagnosis not present

## 2019-05-04 DIAGNOSIS — M7060 Trochanteric bursitis, unspecified hip: Secondary | ICD-10-CM | POA: Diagnosis not present

## 2019-05-09 DIAGNOSIS — M6281 Muscle weakness (generalized): Secondary | ICD-10-CM | POA: Diagnosis not present

## 2019-05-09 DIAGNOSIS — G8929 Other chronic pain: Secondary | ICD-10-CM | POA: Diagnosis not present

## 2019-05-09 DIAGNOSIS — M545 Low back pain: Secondary | ICD-10-CM | POA: Diagnosis not present

## 2019-05-14 DIAGNOSIS — M545 Low back pain: Secondary | ICD-10-CM | POA: Diagnosis not present

## 2019-05-14 DIAGNOSIS — G8929 Other chronic pain: Secondary | ICD-10-CM | POA: Diagnosis not present

## 2019-05-14 DIAGNOSIS — M6281 Muscle weakness (generalized): Secondary | ICD-10-CM | POA: Diagnosis not present

## 2019-05-20 DIAGNOSIS — Z79899 Other long term (current) drug therapy: Secondary | ICD-10-CM | POA: Diagnosis not present

## 2019-05-20 DIAGNOSIS — M1990 Unspecified osteoarthritis, unspecified site: Secondary | ICD-10-CM | POA: Diagnosis not present

## 2019-05-20 DIAGNOSIS — Z7983 Long term (current) use of bisphosphonates: Secondary | ICD-10-CM | POA: Diagnosis not present

## 2019-05-20 DIAGNOSIS — G8929 Other chronic pain: Secondary | ICD-10-CM | POA: Diagnosis not present

## 2019-05-20 DIAGNOSIS — M5126 Other intervertebral disc displacement, lumbar region: Secondary | ICD-10-CM | POA: Diagnosis not present

## 2019-05-20 DIAGNOSIS — M7989 Other specified soft tissue disorders: Secondary | ICD-10-CM | POA: Diagnosis not present

## 2019-05-20 DIAGNOSIS — M48061 Spinal stenosis, lumbar region without neurogenic claudication: Secondary | ICD-10-CM | POA: Diagnosis not present

## 2019-05-20 DIAGNOSIS — M545 Low back pain: Secondary | ICD-10-CM | POA: Diagnosis not present

## 2019-05-20 DIAGNOSIS — M069 Rheumatoid arthritis, unspecified: Secondary | ICD-10-CM | POA: Diagnosis not present

## 2019-05-22 DIAGNOSIS — M5136 Other intervertebral disc degeneration, lumbar region: Secondary | ICD-10-CM | POA: Diagnosis not present

## 2019-05-22 DIAGNOSIS — M5416 Radiculopathy, lumbar region: Secondary | ICD-10-CM | POA: Diagnosis not present

## 2019-05-22 DIAGNOSIS — M48062 Spinal stenosis, lumbar region with neurogenic claudication: Secondary | ICD-10-CM | POA: Diagnosis not present

## 2019-06-10 DIAGNOSIS — Z79899 Other long term (current) drug therapy: Secondary | ICD-10-CM | POA: Diagnosis not present

## 2019-06-10 DIAGNOSIS — Z791 Long term (current) use of non-steroidal anti-inflammatories (NSAID): Secondary | ICD-10-CM | POA: Diagnosis not present

## 2019-06-10 DIAGNOSIS — G8929 Other chronic pain: Secondary | ICD-10-CM | POA: Diagnosis not present

## 2019-06-10 DIAGNOSIS — Z809 Family history of malignant neoplasm, unspecified: Secondary | ICD-10-CM | POA: Diagnosis not present

## 2019-06-10 DIAGNOSIS — M0550 Rheumatoid polyneuropathy with rheumatoid arthritis of unspecified site: Secondary | ICD-10-CM | POA: Diagnosis not present

## 2019-06-10 DIAGNOSIS — Z7952 Long term (current) use of systemic steroids: Secondary | ICD-10-CM | POA: Diagnosis not present

## 2019-06-10 DIAGNOSIS — M1990 Unspecified osteoarthritis, unspecified site: Secondary | ICD-10-CM | POA: Diagnosis not present

## 2019-06-14 DIAGNOSIS — M5416 Radiculopathy, lumbar region: Secondary | ICD-10-CM | POA: Diagnosis not present

## 2019-06-14 DIAGNOSIS — M48062 Spinal stenosis, lumbar region with neurogenic claudication: Secondary | ICD-10-CM | POA: Diagnosis not present

## 2019-06-14 DIAGNOSIS — M5136 Other intervertebral disc degeneration, lumbar region: Secondary | ICD-10-CM | POA: Diagnosis not present

## 2019-07-05 DIAGNOSIS — M1712 Unilateral primary osteoarthritis, left knee: Secondary | ICD-10-CM | POA: Diagnosis not present

## 2019-07-05 DIAGNOSIS — M0579 Rheumatoid arthritis with rheumatoid factor of multiple sites without organ or systems involvement: Secondary | ICD-10-CM | POA: Diagnosis not present

## 2019-07-05 DIAGNOSIS — M25562 Pain in left knee: Secondary | ICD-10-CM | POA: Diagnosis not present

## 2019-07-05 DIAGNOSIS — M7732 Calcaneal spur, left foot: Secondary | ICD-10-CM | POA: Diagnosis not present

## 2019-07-24 ENCOUNTER — Emergency Department
Admission: EM | Admit: 2019-07-24 | Discharge: 2019-07-24 | Disposition: A | Discharge status: Home / Self Care | Payer: Medicare HMO | Attending: Emergency Medicine | Admitting: Emergency Medicine

## 2019-07-24 ENCOUNTER — Other Ambulatory Visit: Payer: Self-pay

## 2019-07-24 DIAGNOSIS — M5137 Other intervertebral disc degeneration, lumbosacral region: Secondary | ICD-10-CM | POA: Insufficient documentation

## 2019-07-24 DIAGNOSIS — M5441 Lumbago with sciatica, right side: Secondary | ICD-10-CM | POA: Insufficient documentation

## 2019-07-24 DIAGNOSIS — M5187 Other intervertebral disc disorders, lumbosacral region: Secondary | ICD-10-CM | POA: Diagnosis not present

## 2019-07-24 DIAGNOSIS — M5442 Lumbago with sciatica, left side: Secondary | ICD-10-CM | POA: Insufficient documentation

## 2019-07-24 DIAGNOSIS — M5489 Other dorsalgia: Secondary | ICD-10-CM | POA: Diagnosis not present

## 2019-07-24 DIAGNOSIS — G8929 Other chronic pain: Secondary | ICD-10-CM | POA: Diagnosis not present

## 2019-07-24 DIAGNOSIS — R52 Pain, unspecified: Secondary | ICD-10-CM | POA: Diagnosis not present

## 2019-07-24 DIAGNOSIS — M545 Low back pain: Secondary | ICD-10-CM | POA: Diagnosis not present

## 2019-07-24 MED ORDER — LIDOCAINE 5 % EX PTCH: 1.0000 | MEDICATED_PATCH | CUTANEOUS | 0 refills | Status: DC

## 2019-07-24 MED ORDER — KETOROLAC TROMETHAMINE 30 MG/ML IJ SOLN
30.0000 mg | Freq: Once | INTRAMUSCULAR | Status: AC
  Administered 2019-07-24: 30 mg via INTRAMUSCULAR
  Filled 2019-07-24: qty 1

## 2019-07-24 MED ORDER — ORPHENADRINE CITRATE 30 MG/ML IJ SOLN
60.0000 mg | INTRAMUSCULAR | Status: AC
Start: 2019-07-24 — End: 2019-07-24
  Administered 2019-07-24: 17:00:00 60 mg via INTRAMUSCULAR
  Filled 2019-07-24: qty 2

## 2019-07-24 NOTE — Discharge Instructions (Signed)
Your exam and previous XRs and MRIs reveal degenerative disc disease and spondylolisthesis. This causes the type of pain you are experiencing down the buttocks and legs. You should consider taking the previously prescribed meds, as directed. Follow-up with Dr. Sharlet Salina for ongoing management.

## 2019-07-24 NOTE — ED Notes (Addendum)
See triage note   Presents with lower back pain which moves into both buttocks   States hx of same   Describes pain as "spasm" like

## 2019-07-24 NOTE — ED Triage Notes (Signed)
Pt comes via EMS from home with c/o back pain. Pt states it hurts to sit and she feels better when she can lay flat or stand.  Pt states it hurts all over. Pt states this has been going on for about 4 months. Pt ambulatory to triage with steady gait.  Pt denies any recent injury or fall.

## 2019-07-26 ENCOUNTER — Emergency Department
Admission: EM | Admit: 2019-07-26 | Discharge: 2019-07-26 | Disposition: A | Discharge status: Home / Self Care | Payer: Medicare HMO | Attending: Emergency Medicine | Admitting: Emergency Medicine

## 2019-07-26 ENCOUNTER — Encounter: Payer: Self-pay | Admitting: Emergency Medicine

## 2019-07-26 ENCOUNTER — Other Ambulatory Visit: Payer: Self-pay

## 2019-07-26 DIAGNOSIS — R69 Illness, unspecified: Secondary | ICD-10-CM | POA: Diagnosis not present

## 2019-07-26 DIAGNOSIS — M545 Low back pain: Secondary | ICD-10-CM | POA: Insufficient documentation

## 2019-07-26 DIAGNOSIS — G8929 Other chronic pain: Secondary | ICD-10-CM | POA: Insufficient documentation

## 2019-07-26 DIAGNOSIS — R52 Pain, unspecified: Secondary | ICD-10-CM | POA: Diagnosis not present

## 2019-07-26 DIAGNOSIS — M5489 Other dorsalgia: Secondary | ICD-10-CM | POA: Diagnosis not present

## 2019-07-26 DIAGNOSIS — I10 Essential (primary) hypertension: Secondary | ICD-10-CM | POA: Diagnosis not present

## 2019-07-26 MED ORDER — LIDOCAINE 5 % EX PTCH
1.0000 | MEDICATED_PATCH | CUTANEOUS | Status: DC
  Administered 2019-07-26: 06:00:00 1 via TRANSDERMAL
  Filled 2019-07-26: qty 1

## 2019-07-26 MED ORDER — LIDOCAINE 5 % EX PTCH: 1.0000 | MEDICATED_PATCH | CUTANEOUS | 0 refills | Status: AC

## 2019-07-26 NOTE — Discharge Instructions (Signed)

## 2019-07-26 NOTE — ED Provider Notes (Signed)
[facility] Emergency Department Provider Note ____________________________________________  Time seen: 1625  I have reviewed the triage vital signs and the nursing notes.  HISTORY  Chief Complaint  Back Pain  HPI Casey [name] is a 73 y.o. female presents to the ED via EMS from home, for complaints of acute on chronic low back pain.  Patient describes pain to the lumbar sacral region, that is worse with attempts to lie flat or stand.  She has been under the care of [facility] rheumatology as well as pain management for her degenerative disc disease and rheumatoid arthritis.  Patient has been on medications including tramadol, hydrocodone, tizanidine, and prednisone.  She presents with these medications in a bag, and reports that she discontinued these medications and lieu of naturopathic medicines, due to concerns over possible dependency.  She denies any recent injury, trauma, or fall patient also denies any bladder or bowel incontinence, foot drop, or saddle anesthesia.  She presents to the ED for request for management of her increased acute pain.  Past Medical History:  Diagnosis Date  . Arthritis   . Back pain     There are no active problems to display for this patient.   Past Surgical History:  Procedure Laterality Date  . EPIDURAL BLOCK INJECTION    . NECK SURGERY      Prior to Admission medications   Medication Sig Start Date End Date Taking? Authorizing Provider  lidocaine (LIDODERM) 5 % Place 1 patch onto the skin daily for 10 days. Remove & Discard patch after 12 hours of wear 07/26/19 08/05/19  Loleta Rose, MD    Allergies Patient has no known allergies.  No family history on file.  Social History Social History   Tobacco Use  . Smoking status: Never Smoker  . Smokeless tobacco: Never Used  Substance Use Topics  . Alcohol use: Never    Frequency: Never  . Drug use: Never    Review of Systems  Constitutional: Negative for fever. Eyes:  Negative for visual changes. ENT: Negative for sore throat. Cardiovascular: Negative for chest pain. Respiratory: Negative for shortness of breath. Gastrointestinal: Negative for abdominal pain, vomiting and diarrhea. Genitourinary: Negative for dysuria. Musculoskeletal: Positive for back pain. Skin: Negative for rash. Neurological: Negative for headaches, focal weakness or numbness. ____________________________________________  PHYSICAL EXAM:  VITAL SIGNS: ED Triage Vitals  Enc Vitals Group     BP 07/24/19 1428 (!) 157/72     Pulse Rate 07/24/19 1428 100     Resp 07/24/19 1428 19     Temp 07/24/19 1428 98.8 F (37.1 C)     Temp src --      SpO2 07/24/19 1428 97 %     Weight 07/24/19 1425 150 lb (68 kg)     Height 07/24/19 1425 5\' 3"  (1.6 m)     Head Circumference --      Peak Flow --      Pain Score 07/24/19 1425 9     Pain Loc --      Pain Edu? --      Excl. in GC? --     Constitutional: Alert and oriented. Well appearing and in no distress.  Patient is standing and pacing the room, in obvious discomfort Head: Normocephalic and atraumatic. Cardiovascular: Normal rate, regular rhythm. Normal distal pulses. Respiratory: Normal respiratory effort. No wheezes/rales/rhonchi. Gastrointestinal: Soft and nontender. No distention. Musculoskeletal: Normal spinal alignment without midline tenderness, spasm, deformity, or step-off.  Patient is tender to palpation of  the lumbar sacral junction.  Nontender with normal range of motion in all extremities.  Neurologic:  Normal gait without ataxia. Normal speech and language. No gross focal neurologic deficits are appreciated. Skin:  Skin is warm, dry and intact. No rash noted. Psychiatric: Mood and affect are normal. Patient exhibits appropriate insight and judgment. ____________________________   RADIOLOGY  Chart Review ____________________________________________  PROCEDURES  Procedures Toradol 30 mg IM Norflex 60 mg  IM ____________________________________________  INITIAL IMPRESSION / ASSESSMENT AND PLAN / ED COURSE  SHAKOYA GILMORE was evaluated in Emergency Department on 07/26/2019 for the symptoms described in the history of present illness. She was evaluated in the context of the global COVID-19 pandemic, which necessitated consideration that the patient might be at risk for infection with the SARS-CoV-2 virus that causes COVID-19. Institutional protocols and algorithms that pertain to the evaluation of patients at risk for COVID-19 are in a state of rapid change based on information released by regulatory bodies including the CDC and federal and state organizations. These policies and algorithms were followed during the patient's care in the ED.  Patient with ED evaluation of acute on chronic low back pain.  I reviewed the patient's chart including her previous images which confirm a diagnosis of DDD of the lumbar sacral region as well as some spondylolisthesis.  I also discussed in some detail in length with the patient about the medications available to her and the usefulness of the medications for her condition.  Giving consideration to her concern for dependence on these medications I advised her to discuss with Dr. Sharlet Salina or Dr. Jefm Bryant, her concerns, and consider other interventions.  I also discussed with the patient is likely not a surgical candidate, and her continued use of pain modalities including epidural steroid injections may be her best option.  She verbalized understanding and thank me for the time and consideration given to her today.  She reports improvement of her symptoms at the time of discharge.  She is encouraged to return to the ED as needed. ____________________________________________  FINAL CLINICAL IMPRESSION(S) / ED DIAGNOSES  Final diagnoses:  DDD (degenerative disc disease), lumbosacral  Chronic midline low back pain with bilateral sciatica      Carmie End, Dannielle Karvonen,  PA-C 07/26/19 2345    Harvest Dark, MD 07/27/19 1436

## 2019-07-26 NOTE — ED Provider Notes (Signed)
[facility] Emergency Department Provider Note  ____________________________________________   First MD Initiated Contact with Patient 07/26/19 [phone]     (approximate)  I have reviewed the triage vital signs and the nursing notes.   HISTORY  Chief Complaint Back Pain    HPI Casey [name] is a 73 y.o. female with history of degenerative disc disease, arthritis, and chronic back pain.  She presents by EMS for acute on chronic severe low back pain down at the base of her lower back.  She says it feels like her usual back pain except that it is worse.  She normally takes hydrocodone when the pain is severe and said that it got worse tonight around 2 AM and was unable to go back to sleep.  She was given a total of fentanyl 150 mcg IV by EMS which made her pain go away completely.  She was saying that she was ready to go home before I was able to get in the room to see her.  She has had no loss of urinary control and no urinary retention.  Pain is worse with ambulation and nothing in particular makes it better except for the fentanyl she got by EMS.  She has been seen in the past in the emergency department for this issue as well as by a rheumatologist and other providers.  She was seen 2 days ago in flex and was given a Lidoderm prescription which she says she was unable to fill.  She was encouraged to go see Dr. Sharlet Salina with physiatry for further evaluation and treatment but she has not yet had the opportunity to do so.         Past Medical History:  Diagnosis Date   Arthritis    Back pain     There are no active problems to display for this patient.   Past Surgical History:  Procedure Laterality Date   EPIDURAL BLOCK INJECTION     NECK SURGERY      Prior to Admission medications   Medication Sig Start Date End Date Taking? Authorizing Provider  lidocaine (LIDODERM) 5 % Place 1 patch onto the skin daily for 10 days. Remove & Discard patch after 12  hours of wear 07/26/19 08/05/19  Hinda Kehr, MD    Allergies Patient has no known allergies.  No family history on file.  Social History Social History   Tobacco Use   Smoking status: Never Smoker   Smokeless tobacco: Never Used  Substance Use Topics   Alcohol use: Never    Frequency: Never   Drug use: Never    Review of Systems Constitutional: No fever/chills Cardiovascular: Denies chest pain. Respiratory: Denies shortness of breath. Gastrointestinal: No abdominal pain.   Genitourinary: Negative for dysuria.  No history of urinary incontinence nor urinary retention. Musculoskeletal: Acute on chronic low back pain. Integumentary: Negative for rash. Neurological: Negative for headaches, focal weakness or numbness.   ____________________________________________   PHYSICAL EXAM:  VITAL SIGNS: ED Triage Vitals  Enc Vitals Group     BP 07/26/19 0329 (!) 174/84     Pulse Rate 07/26/19 0329 90     Resp 07/26/19 0329 18     Temp 07/26/19 0329 97.9 F (36.6 C)     Temp Source 07/26/19 0329 Oral     SpO2 07/26/19 0329 99 %     Weight 07/26/19 0332 68 kg (150 lb)     Height 07/26/19 0332 1.6 m (5\' 3" )  Head Circumference --      Peak Flow --      Pain Score 07/26/19 0332 0     Pain Loc --      Pain Edu? --      Excl. in GC? --     Constitutional: Alert and oriented.  Appears uncomfortable but is not in acute distress at this time. Head: Atraumatic. Cardiovascular: Normal rate, regular rhythm. Good peripheral circulation. Respiratory: Normal respiratory effort.  No retractions. Musculoskeletal: No gross abnormalities identified in the lumbar spine.  No tenderness to palpation of the lumbar spine or of the paraspinal muscles. Neurologic:  Normal speech and language. No gross focal neurologic deficits are appreciated.  Skin:  Skin is warm, dry and intact. Psychiatric: Mood and affect are normal. Speech and behavior are  normal.  ____________________________________________   LABS (all labs ordered are listed, but only abnormal results are displayed)  Labs Reviewed - No data to display ____________________________________________  EKG  No indication for EKG ____________________________________________  RADIOLOGY I, Loleta Rose, personally viewed and evaluated these images (plain radiographs) as part of my medical decision making, as well as reviewing the written report by the radiologist.  ED MD interpretation: No indication for emergent imaging  Official radiology report(s): No results found.  ____________________________________________   PROCEDURES   Procedure(s) performed (including Critical Care):  Procedures   ____________________________________________   INITIAL IMPRESSION / MDM / ASSESSMENT AND PLAN / ED COURSE  As part of my medical decision making, I reviewed the following data within the electronic MEDICAL RECORD NUMBER Nursing notes reviewed and incorporated, Old chart reviewed, Notes from prior ED visits and [place] Controlled Substance Database   Patient has acute on chronic back pain which was relieved with fentanyl.  I looked back through the medical records including care everywhere and she has had numerous visits to multiple providers and hospitals for similar issues.  She has degenerative disc disease and chronic back pain that appears to have flared up tonight without any evidence of new traumatic injury.  She has no emergent neurological signs or symptoms.  No indication for further imaging tonight.  I explained that I cannot prescribe narcotics but I encouraged her to take her regular medications.  I observed her walking without any difficulties and she is neurologically intact on exam.  She is comfortable with the plan for follow-up.  I gave her a new prescription for Lidoderm patch since she says she was unable to fill the first 1 because it was prescribed by midlevel  provider.  I again encouraged her to go see Dr. Yves Dill and I gave my usual and customary return precautions.          ____________________________________________  FINAL CLINICAL IMPRESSION(S) / ED DIAGNOSES  Final diagnoses:  Chronic midline low back pain without sciatica     MEDICATIONS GIVEN DURING THIS VISIT:  Medications  lidocaine (LIDODERM) 5 % 1 patch (1 patch Transdermal Patch Applied 07/26/19 0626)     ED Discharge Orders         Ordered    lidocaine (LIDODERM) 5 %  Every 24 hours     07/26/19 0622          *Please note:  Casey [name] was evaluated in Emergency Department on 07/26/2019 for the symptoms described in the history of present illness. She was evaluated in the context of the global COVID-19 pandemic, which necessitated consideration that the patient might be at risk for infection with the SARS-CoV-2 virus that causes  COVID-19. Institutional protocols and algorithms that pertain to the evaluation of patients at risk for COVID-19 are in a state of rapid change based on information released by regulatory bodies including the CDC and federal and state organizations. These policies and algorithms were followed during the patient's care in the ED.  Some ED evaluations and interventions may be delayed as a result of limited staffing during the pandemic.*  Note:  This document was prepared using Dragon voice recognition software and may include unintentional dictation errors.   Loleta RoseForbach, Yahsir Wickens, MD 07/26/19 [id number]

## 2019-07-26 NOTE — ED Notes (Signed)
Pt given ice pack at this time.

## 2019-07-26 NOTE — ED Triage Notes (Addendum)
Pt presents to ED via EMS from home with c/o lower back pain.  Pt has degenerative disc disease and typically takes hydrocodone when her pain is severe. Pain worsened tonight around 2am and pt was unable to sleep. Pt given 150 mcg of fentanyl by EMS with good relief. Pt currently denies pain.

## 2019-07-26 NOTE — ED Notes (Signed)
Pt taken to the restroom. Tolerated well. Standby assistance needed.

## 2019-08-13 ENCOUNTER — Other Ambulatory Visit: Payer: Self-pay

## 2019-08-13 ENCOUNTER — Emergency Department: Payer: Medicare HMO

## 2019-08-13 ENCOUNTER — Encounter: Payer: Self-pay | Admitting: Emergency Medicine

## 2019-08-13 ENCOUNTER — Emergency Department
Admission: EM | Admit: 2019-08-13 | Discharge: 2019-08-13 | Disposition: A | Discharge status: Home / Self Care | Payer: Medicare HMO | Attending: Emergency Medicine | Admitting: Emergency Medicine

## 2019-08-13 DIAGNOSIS — M25561 Pain in right knee: Secondary | ICD-10-CM | POA: Insufficient documentation

## 2019-08-13 DIAGNOSIS — M7121 Synovial cyst of popliteal space [Baker], right knee: Secondary | ICD-10-CM | POA: Insufficient documentation

## 2019-08-13 DIAGNOSIS — M7989 Other specified soft tissue disorders: Secondary | ICD-10-CM | POA: Diagnosis not present

## 2019-08-13 MED ORDER — OXYCODONE-ACETAMINOPHEN 5-325 MG PO TABS
1.0000 | ORAL_TABLET | Freq: Once | ORAL | Status: AC
  Administered 2019-08-13: 1 via ORAL
  Filled 2019-08-13: qty 1

## 2019-08-13 MED ORDER — TRAMADOL HCL 50 MG PO TABS: 50.0000 mg | ORAL_TABLET | Freq: Four times a day (QID) | ORAL | 0 refills | Status: AC | PRN

## 2019-08-13 NOTE — ED Provider Notes (Signed)
[facility] Emergency Department Provider Note  ____________________________________________  Time seen: Approximately 8:26 PM  I have reviewed the triage vital signs and the nursing notes.   HISTORY  Chief Complaint Knee Pain    HPI Casey [name] is a 73 y.o. female presents to the emergency department with 7 out of 10 right knee pain. Patient has a history of right knee pain and receives cortisone injections periodically and would like an additional cortisone injection tonight. She denies falls or mechanisms of trauma.  She denies a history of smoking.  No prolonged immobilization or history of recent surgery.  She denies pleuritic chest pain or shortness of breath.  No other alleviating measures have been attempted.       Past Medical History:  Diagnosis Date  . Arthritis   . Back pain     There are no active problems to display for this patient.   Past Surgical History:  Procedure Laterality Date  . EPIDURAL BLOCK INJECTION    . NECK SURGERY      Prior to Admission medications   Medication Sig Start Date End Date Taking? Authorizing Provider  traMADol (ULTRAM) 50 MG tablet Take 1 tablet (50 mg total) by mouth every 6 (six) hours as needed for up to 3 days. 08/13/19 08/16/19  Orvil Feil, PA-C    Allergies Patient has no known allergies.  No family history on file.  Social History Social History   Tobacco Use  . Smoking status: Never Smoker  . Smokeless tobacco: Never Used  Substance Use Topics  . Alcohol use: Never    Frequency: Never  . Drug use: Never     Review of Systems  Constitutional: No fever/chills Eyes: No visual changes. No discharge ENT: No upper respiratory complaints. Cardiovascular: no chest pain. Respiratory: no cough. No SOB. Gastrointestinal: No abdominal pain.  No nausea, no vomiting.  No diarrhea.  No constipation. Musculoskeletal: Patient has right knee pain. Skin: Negative for rash, abrasions,  lacerations, ecchymosis. Neurological: Negative for headaches, focal weakness or numbness.  ____________________________________________   PHYSICAL EXAM:  VITAL SIGNS: ED Triage Vitals  Enc Vitals Group     BP 08/13/19 1952 (!) 176/79     Pulse Rate 08/13/19 1952 99     Resp 08/13/19 1952 18     Temp 08/13/19 1952 98.1 F (36.7 C)     Temp Source 08/13/19 1952 Oral     SpO2 08/13/19 1952 97 %     Weight 08/13/19 1953 140 lb (63.5 kg)     Height 08/13/19 1953 5\' 3"  (1.6 m)     Head Circumference --      Peak Flow --      Pain Score 08/13/19 1953 9     Pain Loc --      Pain Edu? --      Excl. in GC? --      Constitutional: Alert and oriented. Well appearing and in no acute distress. Eyes: Conjunctivae are normal. PERRL. EOMI. Head: Atraumatic. Cardiovascular: Normal rate, regular rhythm. Normal S1 and S2.  Good peripheral circulation. Respiratory: Normal respiratory effort without tachypnea or retractions. Lungs CTAB. Good air entry to the bases with no decreased or absent breath sounds. Gastrointestinal: Bowel sounds 4 quadrants. Soft and nontender to palpation. No guarding or rigidity. No palpable masses. No distention. No CVA tenderness. Musculoskeletal: Patient performs full range of motion at the right knee.  Suspect mild erythema of the right knee is secondary to use of  knee immobilizer.  Palpable dorsalis pedis pulse bilaterally and symmetrically. Neurologic:  Normal speech and language. No gross focal neurologic deficits are appreciated.  Skin:  Skin is warm, dry and intact. No rash noted. Psychiatric: Mood and affect are normal. Speech and behavior are normal. Patient exhibits appropriate insight and judgement.   ____________________________________________   LABS (all labs ordered are listed, but only abnormal results are displayed)  Labs Reviewed - No data to  display ____________________________________________  EKG   ____________________________________________  RADIOLOGY I personally viewed and evaluated these images as part of my medical decision making, as well as reviewing the written report by the radiologist.    US Venous Img Lower Unilateral Right  Result Date: 08/13/2019 CLINICAL DATA:  Right lower leg swelling for 1 day. No known injury. EXAM: RIGHT LOWER EXTREMITY VENOUS DOPPLER ULTRASOUND TECHNIQUE: Gray-scale sonography with graded compression, as well as color Doppler and duplex ultrasound were performed to evaluate the lower extremity deep venous systems from the level of the common femoral vein and including the common femoral, femoral, profunda femoral, popliteal and calf veins including the posterior tibial, peroneal and gastrocnemius veins when visible. The superficial great saphenous vein was also interrogated. Spectral Doppler was utilized to evaluate flow at rest and with distal augmentation maneuvers in the common femoral, femoral and popliteal veins. COMPARISON:  None. FINDINGS: Contralateral Common Femoral Vein: Respiratory phasicity is normal and symmetric with the symptomatic side. No evidence of thrombus. Normal compressibility. Common Femoral Vein: No evidence of thrombus. Normal compressibility, respiratory phasicity and response to augmentation. Saphenofemoral Junction: No evidence of thrombus. Normal compressibility and flow on color Doppler imaging. Profunda Femoral Vein: No evidence of thrombus. Normal compressibility and flow on color Doppler imaging. Femoral Vein: No evidence of thrombus. Normal compressibility, respiratory phasicity and response to augmentation. Popliteal Vein: No evidence of thrombus. Normal compressibility, respiratory phasicity and response to augmentation. Calf Veins: No evidence of thrombus. Normal compressibility and flow on color Doppler imaging. Superficial Great Saphenous Vein: No evidence of  thrombus. Normal compressibility. Venous Reflux:  None. Other Findings: Small Baker's cysts measuring 4.2 x 0.8 x 1.7 cm noted. IMPRESSION: No evidence of deep venous thrombosis. Small Baker's cyst. Electronically Signed   By: Drusilla Kanner M.D.   On: 08/13/2019 21:34    ____________________________________________    PROCEDURES  Procedure(s) performed:    Procedures    Medications  oxyCODONE-acetaminophen (PERCOCET/ROXICET) 5-325 MG per tablet 1 tablet (1 tablet Oral Given 08/13/19 2032)     ____________________________________________   INITIAL IMPRESSION / ASSESSMENT AND PLAN / ED COURSE  Pertinent labs & imaging results that were available during my care of the patient were reviewed by me and considered in my medical decision making (see chart for details).  Review of the [place] CSRS was performed in accordance of the [facility] prior to dispensing any controlled drugs.           Assessment and Plan:  Right knee pain 73 year old female presents to the emergency department with acute right knee pain that has been going on for the past 4 to 5 days.  Patient was able to perform full range of motion at the right knee.  Patient had some mild erythema overlying the right knee which I suspected was secondary to knee immobilizer use.  Venous ultrasound conducted in the emergency department revealed no evidence of thromboembolism.  Ultrasound did reveal findings consistent with a small Baker's cyst.  Patient was given a Percocet in the emergency department and she was discharged  with a short course of tramadol.  She was advised to follow-up with orthopedics as needed.  All patient questions were answered.    ____________________________________________  FINAL CLINICAL IMPRESSION(S) / ED DIAGNOSES  Final diagnoses:  Acute pain of right knee      NEW MEDICATIONS STARTED DURING THIS VISIT:  ED Discharge Orders         Ordered    traMADol (ULTRAM) 50 MG tablet  Every 6 hours  PRN     08/13/19 2153              This chart was dictated using voice recognition software/Dragon. Despite best efforts to proofread, errors can occur which can change the meaning. Any change was purely unintentional.    Karren Cobble 08/13/19 2202    Carrie Mew, MD 08/13/19 2350

## 2019-08-13 NOTE — ED Notes (Signed)
Pt assisted to bed by this RN and ultrasound tech. Pt unable to place any weight onto right leg. Pt requesting not to straighten leg completely.

## 2019-08-13 NOTE — ED Triage Notes (Signed)
Patient ambulatory to triage with steady gait, without difficulty or distress noted; pt reports rt knee pain this afternoon with no known injury; st "I just need an injection"

## 2019-08-15 DIAGNOSIS — M1711 Unilateral primary osteoarthritis, right knee: Secondary | ICD-10-CM | POA: Diagnosis not present

## 2019-08-15 DIAGNOSIS — M7121 Synovial cyst of popliteal space [Baker], right knee: Secondary | ICD-10-CM | POA: Diagnosis not present

## 2019-08-15 DIAGNOSIS — M25561 Pain in right knee: Secondary | ICD-10-CM | POA: Diagnosis not present

## 2019-08-20 DIAGNOSIS — M48062 Spinal stenosis, lumbar region with neurogenic claudication: Secondary | ICD-10-CM | POA: Diagnosis not present

## 2019-08-20 DIAGNOSIS — M5416 Radiculopathy, lumbar region: Secondary | ICD-10-CM | POA: Diagnosis not present

## 2019-08-20 DIAGNOSIS — M5136 Other intervertebral disc degeneration, lumbar region: Secondary | ICD-10-CM | POA: Diagnosis not present

## 2019-08-29 DIAGNOSIS — M4310 Spondylolisthesis, site unspecified: Secondary | ICD-10-CM | POA: Diagnosis not present

## 2019-08-29 DIAGNOSIS — M48062 Spinal stenosis, lumbar region with neurogenic claudication: Secondary | ICD-10-CM | POA: Diagnosis not present

## 2019-08-29 DIAGNOSIS — M810 Age-related osteoporosis without current pathological fracture: Secondary | ICD-10-CM | POA: Diagnosis not present

## 2019-08-29 DIAGNOSIS — M5416 Radiculopathy, lumbar region: Secondary | ICD-10-CM | POA: Diagnosis not present

## 2019-09-02 DIAGNOSIS — M810 Age-related osteoporosis without current pathological fracture: Secondary | ICD-10-CM | POA: Diagnosis not present

## 2019-09-04 DIAGNOSIS — R799 Abnormal finding of blood chemistry, unspecified: Secondary | ICD-10-CM | POA: Diagnosis not present

## 2019-09-04 DIAGNOSIS — Z131 Encounter for screening for diabetes mellitus: Secondary | ICD-10-CM | POA: Diagnosis not present

## 2019-09-10 DIAGNOSIS — G8929 Other chronic pain: Secondary | ICD-10-CM | POA: Diagnosis not present

## 2019-09-10 DIAGNOSIS — M0579 Rheumatoid arthritis with rheumatoid factor of multiple sites without organ or systems involvement: Secondary | ICD-10-CM | POA: Diagnosis not present

## 2019-09-10 DIAGNOSIS — Z79899 Other long term (current) drug therapy: Secondary | ICD-10-CM | POA: Diagnosis not present

## 2019-09-10 DIAGNOSIS — M810 Age-related osteoporosis without current pathological fracture: Secondary | ICD-10-CM | POA: Diagnosis not present

## 2019-09-10 DIAGNOSIS — M25562 Pain in left knee: Secondary | ICD-10-CM | POA: Diagnosis not present

## 2019-10-30 ENCOUNTER — Encounter: Payer: Self-pay | Admitting: Student in an Organized Health Care Education/Training Program

## 2019-10-30 NOTE — Progress Notes (Signed)
Pleasant patient, good historian states she has spinal stenosis which causes her pain in her buttocks on both sides and sometimes radiates down her legs.  States Dr Jefm Bryant has prescribed Raloxifene and she is waiting for medicine d/t cost.  Patient is otherwise healthy.

## 2019-10-30 NOTE — Progress Notes (Signed)
Patient's Name: Casey [name]  MRN: [id number]  Referring Provider: Venetia NightYarbrough, Chester, MD  DOB: 11/17/1946  PCP: Etheleen NicksMacDonald, Keri, NP  DOS: 10/31/2019  Note by: Edward JollyBilal Koua Deeg, MD  Service setting: Ambulatory outpatient  Specialty: Interventional Pain Management  Location: [facility] Management Virtual Visit  Visit type: Initial Patient Evaluation  Patient type: New Patient   Pain Management Virtual Encounter Note - Virtual Visit via Video Conference Telehealth (real-time audio visits between healthcare provider and patient).   Patient's Phone No.:  [phone] (home); [phone] (mobile); (Preferred) [phone] [email or web address]  [id number], [place] - [address] [place] [address] Phone: [phone] Fax: [phone]    Pre-screening note:  Our staff contacted Ms. Shadrick and offered her an "in person", "face-to-face" appointment versus a telephone encounter. She indicated preferring the telephone encounter, at this time.  Primary Reason(s) for Visit: Tele-Encounter for initial evaluation of one or more chronic problems (new to examiner) potentially causing chronic pain, and posing a threat to normal musculoskeletal function. (Level of risk: High) CC: Hip Pain (bilateral buttocks)  I contacted Casey [name] on 10/31/2019 via video conference.      I clearly identified myself as Edward JollyBilal Robt Okuda, MD. I verified that I was speaking with the correct person using two identifiers (Name: Casey [name], and date of birth: 03/01/1946).  Advanced Informed Consent I sought verbal advanced consent from Casey Concharolyn [name] Hector for virtual visit interactions. I informed [name] of possible security and privacy concerns, risks, and limitations associated with providing "not-in-person" medical evaluation and management services. I also informed [name] of the availability of "in-person" appointments. Finally, I informed her that there would be a charge for the virtual  visit and that she could be  personally, fully or partially, financially responsible for it. [name] expressed understanding and agreed to proceed.   HPI  [name] is a 73 y.o. year old, female patient, contacted today for an initial evaluation of her chronic pain. She has Rheumatoid arthritis, unspecified (HCC); Osteoporosis; Nondisplaced fracture of right radial styloid process, initial encounter for closed fracture; Chronic radicular lumbar pain; Lumbar radiculopathy; Lumbar facet arthropathy; Lumbar spondylosis; Spinal stenosis, lumbar region, with neurogenic claudication; Lumbar degenerative disc disease; Bilateral primary osteoarthritis of knee; and Sacroiliac joint pain on their problem list.  Pain Assessment: Location: Left, Right Buttocks Radiating: into both legs Onset: More than a month ago Duration: Chronic pain Quality: Constant, Discomfort, Spasm Severity: 7 /10 (subjective, self-reported pain score)  Effect on ADL: has trouble getting comfortable when sitting or lying down. Timing: Constant Modifying factors: standing, stretching used to help, medications  Onset and Duration: Gradual and Present longer than 3 months Cause of pain: Unknown Severity: Getting worse, NAS-11 at its worse: 10/10, NAS-11 at its best: 6/10, NAS-11 now: 7/10 and NAS-11 on the average: 7/10 Timing: Not influenced by the time of the day Aggravating Factors: Prolonged sitting, Walking and lying down Alleviating Factors: Stretching, Medications and Standing Associated Problems: Spasms and Pain that wakes patient up Quality of Pain: Annoying, Constant and Uncomfortable Previous Examinations or Tests: Bone scan and Neurological evaluation Previous Treatments: Epidural steroid injections and Stretching exercises  Patient is a pleasant 73 year old female who presents with a chief complaint of low back and bilateral buttock pain.  She describes his pain is very sharp.  This is very distressing for her.  No  inciting or traumatic event.  This is been present for many years.  She has done physical  therapy this past summer.  She does have difficulty ambulating at times given her buttock pain.  She does engage in stretching exercises to help alleviate her pain.  Patient also has a history of rheumatoid arthritis, osteoporosis.  Patient is on methotrexate for RA.  She has been evaluated by orthopedics as well as neurosurgery.  She did have a bilateral L5-S1 transforaminal epidural steroid injection done this past summer which she states provided mild to moderate benefit.  [facility] Dr. Leanora IvanoffBo Mako did not recommend spine surgery given her rheumatoid arthritis and osteoporosis.  Patient is not on chronic opioid therapy. Historic Controlled Substance Pharmacotherapy Review  PMP and historical list of controlled substances:  08/13/2019  1   08/13/2019  Tramadol Hcl 50 MG Tablet  12.00  3 Eula FriedJa Woo   [id number]   [place] ([id number])   0  20.00 MME  Medicare   [place]   Historical Monitoring: The patient  reports no history of drug use. List of all UDS Test(s): No results found for: MDMA, COCAINSCRNUR, PCPSCRNUR, PCPQUANT, CANNABQUANT, THCU, ETH List of other Serum/Urine Drug Screening Test(s):  No results found for: AMPHSCRSER, BARBSCRSER, BENZOSCRSER, COCAINSCRSER, COCAINSCRNUR, PCPSCRSER, PCPQUANT, THCSCRSER, THCU, CANNABQUANT, OPIATESCRSER, OXYSCRSER, PROPOXSCRSER, ETH Historical Background Evaluation: [place] PMP: PDMP not reviewed this encounter. Six (6) year initial data search conducted.             Risk Assessment Profile: Aberrant behavior: None observed or detected today Risk factors for fatal opioid overdose: None identified today Fatal overdose hazard ratio (HR): Calculation deferred Non-fatal overdose hazard ratio (HR): Calculation deferred Risk of opioid abuse or dependence: 0.7-3.0% with doses ? 36 MME/day and 6.1-26% with doses ? 120 MME/day. Substance use disorder (SUD) risk level:  See below Personal History of Substance Abuse (SUD-Substance use disorder):  Alcohol: Negative  Illegal Drugs: Negative  Rx Drugs: Negative  ORT Risk Level calculation: Low Risk Opioid Risk Tool - 10/30/19 0943      Family History of Substance Abuse   Alcohol  Negative    Illegal Drugs  Negative    Rx Drugs  Negative      Personal History of Substance Abuse   Alcohol  Negative    Illegal Drugs  Negative    Rx Drugs  Negative      Age   Age between 7916-45 years   No      Psychological Disease   Psychological Disease  Negative    Depression  Negative      Total Score   Opioid Risk Tool Scoring  0    Opioid Risk Interpretation  Low Risk      ORT Scoring interpretation table:  Score <3 = Low Risk for SUD  Score between 4-7 = Moderate Risk for SUD  Score >8 = High Risk for Opioid Abuse   Pharmacologic Plan: As per protocol, I have not taken over any controlled substance management, pending the results of ordered tests and/or consults.            Initial impression: Pending review of available data and ordered tests.  Meds   Current Outpatient Medications:  .  Adalimumab 40 MG/0.8ML PNKT, Inject 0.8 mLs into the skin every 14 (fourteen) days., Disp: , Rfl:  .  alendronate (FOSAMAX) 70 MG tablet, Take 70 mg by mouth once a week., Disp: , Rfl:  .  etodolac (LODINE) 400 MG tablet, Take 400 mg by mouth 2 (two) times daily as needed., Disp: ,  Rfl:  .  gabapentin (NEURONTIN) 300 MG capsule, Take 1 capsule (300 mg total) by mouth 3 (three) times daily., Disp: 90 capsule, Rfl: 1 .  predniSONE (DELTASONE) 5 MG tablet, Take 2.5 mg by mouth daily., Disp: , Rfl:  .  tiZANidine (ZANAFLEX) 4 MG tablet, Take 4 mg by mouth 3 (three) times daily as needed. Taking 1 at night, Disp: , Rfl:  .  traMADol (ULTRAM) 50 MG tablet, Take 50 mg by mouth 2 (two) times daily as needed. States she is not taking, Disp: , Rfl:   ROS  Cardiovascular: No reported cardiovascular signs or symptoms such as High  blood pressure, coronary artery disease, abnormal heart rate or rhythm, heart attack, blood thinner therapy or heart weakness and/or failure Pulmonary or Respiratory: No reported pulmonary signs or symptoms such as wheezing and difficulty taking a deep full breath (Asthma), difficulty blowing air out (Emphysema), coughing up mucus (Bronchitis), persistent dry cough, or temporary stoppage of breathing during sleep Neurological: No reported neurological signs or symptoms such as seizures, abnormal skin sensations, urinary and/or fecal incontinence, being born with an abnormal open spine and/or a tethered spinal cord Review of Past Neurological Studies: No results found for this or any previous visit. Psychological-Psychiatric: No reported psychological or psychiatric signs or symptoms such as difficulty sleeping, anxiety, depression, delusions or hallucinations (schizophrenial), mood swings (bipolar disorders) or suicidal ideations or attempts Gastrointestinal: No reported gastrointestinal signs or symptoms such as vomiting or evacuating blood, reflux, heartburn, alternating episodes of diarrhea and constipation, inflamed or scarred liver, or pancreas or irrregular and/or infrequent bowel movements Genitourinary: No reported renal or genitourinary signs or symptoms such as difficulty voiding or producing urine, peeing blood, non-functioning kidney, kidney stones, difficulty emptying the bladder, difficulty controlling the flow of urine, or chronic kidney disease Hematological: No reported hematological signs or symptoms such as prolonged bleeding, low or poor functioning platelets, bruising or bleeding easily, hereditary bleeding problems, low energy levels due to low hemoglobin or being anemic Endocrine: No reported endocrine signs or symptoms such as high or low blood sugar, rapid heart rate due to high thyroid levels, obesity or weight gain due to slow thyroid or thyroid disease Rheumatologic: Joint aches  and or swelling due to excess weight (Osteoarthritis) Musculoskeletal: Negative for myasthenia gravis, muscular dystrophy, multiple sclerosis or malignant hyperthermia Work History: Retired  Allergies  Ms. Shippy has No Known Allergies.    Imaging Review    Lumbosacral Imaging: Lumbar MR wo contrast:  Results for orders placed during the hospital encounter of 07/02/16  MR Lumbar Spine Wo Contrast   Narrative CLINICAL DATA:  Low back pain radiating into the left buttock to the knee. Symptoms for 3-4 years but worsening. Now with occasional right hip pain. No known injury.  EXAM: MRI LUMBAR SPINE WITHOUT CONTRAST  TECHNIQUE: Multiplanar, multisequence MR imaging of the lumbar spine was performed. No intravenous contrast was administered.  COMPARISON:  MRI lumbar spine 02/20/2010.  FINDINGS: Segmentation:  Unremarkable.  Alignment: 0.4 cm anterolisthesis L4 on L5 due to facet degenerative disease is new since the prior MRI.  Vertebrae: Height is maintained. Degenerative endplate signal change is noted at L2-3. No worrisome marrow lesion.  Conus medullaris: Extends to the L1 level and appears normal.  Paraspinal and other soft tissues: Unremarkable.  Disc levels:  T11-12 and T12-L1 are imaged in the sagittal plane only. The central canal and foramina are widely patent at each level with minimal disc bulging noted.  L1-2: Negative.  L2-3: Shallow  disc bulge with a superimposed right lateral recess protrusion extending into the right foramen are identified. Narrowing in the right lateral recess is present with encroachment on the descending right L3 root. There is mild to moderate right foraminal narrowing. The left foramen is open. The appearance is unchanged.  L3-4: There is a shallow disc bulge somewhat more prominent to the left. There is some narrowing in the left lateral recess. The foramina are open. The appearance is unchanged.  L4-5: There has been  progression of disease at this level. Left worse than right facet arthropathy is seen and there is a disc bulge. Ligamentum flavum thickening is also identified. Moderately severe central canal stenosis is seen with left worse than right lateral recess narrowing. The foramina are open.  L5-S1: Ligamentum flavum thickening and a shallow broad-based central protrusion without central canal stenosis. The foramina are open.  IMPRESSION: Since the prior examination spondylosis at L4-5 has markedly worsened. Increased facet degenerative change resulting in 0.4 cm anterolisthesis. Disc and ligamentum flavum thickening cause in moderately severe central canal and left worse than right lateral recess narrowing at this level.  No change in right lateral recess narrowing at L2-3 due to a small protrusion.  No change in mild left lateral recess narrowing L3-4 due to a disc bulge to the left.   Electronically Signed   By: Drusilla Kanner [name].D.   On: 07/02/2016 15:37    Advanced multilevel lumbar spondyloarthropathy, detailed above.  At L2-L3, a right foraminal disc protrusion and facet arthrosis contribute to moderate/severe canal stenosis and deformation of the right thecal sac, severe right and moderate left neural foraminal narrowing at this level as well as severe impingement on the descending right L3 nerve root.  At L4-L5, grade 1 anterolisthesis contributes to severe spinal canal stenosis, moderate left and mild right neural foraminal narrowing.  Result Narrative  EXAM: Magnetic resonance imaging, spinal canal and contents, lumbar, without contrast material. DATE: 05/04/2019 9:34 AM ACCESSION: [id number] UN DICTATED: 05/04/2019 9:35 AM INTERPRETATION LOCATION: [facility]  CLINICAL INDICATION: 73 years old Female with evaluate cauda equina ; Back pain, cauda equina syndrome suspected   COMPARISON: CT abdomen pelvis dated 12/26/2000.  TECHNIQUE: Multiplanar MRI was performed through  the lumbar spine without intravenous contrast.  FINDINGS:  For the purposes of this dictation, the lowest well formed intervertebral disc space is assumed to be the L5-S1 level, and there are presumed to be five lumbar-type vertebral bodies.  Minimal levocurvature of the lumbar spine and straightening of the normal lumbar lordosis. Vertebral body heights are overall maintained. No acute fracture line. The signal intensity throughout the bone marrow is within normal limits. Grade 1 anterolisthesis of L4 on L5 and L5 on S1. Diffuse disc desiccation is present, greatest at L2-L3 and L4-L5. The conus is noted to end at a normal level and the visualized spinal cord signal is unremarkable.  L1-L2: Diffuse posterior disc bulge and facet arthrosis. No significant spinal canal stenosis. Mild neural foraminal narrowing bilaterally.  L2-L3: Posterior disc bulge with superimposed right foraminal disc protrusion and ligamentum flavum thickening with facet arthropathy contributes to moderate spinal canal stenosis with marked deformation of the thecal sac on the right. There is severe right and moderate left neural foraminal narrowing, with severe impingement on the descending right L3 nerve root.  L3-L4: Previous posterior disc bulge with ligamentum flavum thickening and facet arthrosis contributing to mild spinal canal stenosis and moderate left greater than right neural foraminal narrowing.  L4-L5: Grade 1 anterolisthesis  of L4 on L5. Diffuse posterior disc bulge with ligamentum flavum thickening and advanced facet arthrosis contributes to severe spinal canal stenosis, moderate left and mild right neural foraminal narrowing.  L5-S1: There is posterior disc bulge and ligamentum flavum thickening and facet arthropathy contributes to mild spinal canal stenosis and mild bilateral neural foraminal narrowing.  The paraspinal tissues are within normal limits.  Other Result Information  Interface, Rad Results In -  05/04/2019 10:35 AM EDT EXAM: Magnetic resonance imaging, spinal canal and contents, lumbar, without contrast material. DATE: 05/04/2019 9:34 AM ACCESSION: [id number] UN DICTATED: 05/04/2019 9:35 AM INTERPRETATION LOCATION: [facility]  CLINICAL INDICATION: 73 years old Female with evaluate cauda equina ; Back pain, cauda equina syndrome suspected    COMPARISON: CT abdomen pelvis dated 12/26/2000.  TECHNIQUE: Multiplanar MRI was performed through the lumbar spine without intravenous contrast.  FINDINGS:  For the purposes of this dictation, the lowest well formed intervertebral disc space is assumed to be the L5-S1 level, and there are presumed to be five lumbar-type vertebral bodies.  Minimal levocurvature of the lumbar spine and straightening of the normal lumbar lordosis. Vertebral body heights are overall maintained. No acute fracture line. The signal intensity throughout the bone marrow is within normal limits. Grade 1 anterolisthesis of L4 on L5 and L5 on S1. Diffuse disc desiccation is present, greatest at L2-L3 and L4-L5. The conus is noted to end at a normal level and the visualized spinal cord signal is unremarkable.  L1-L2: Diffuse posterior disc bulge and facet arthrosis. No significant spinal canal stenosis. Mild neural foraminal narrowing bilaterally.  L2-L3: Posterior disc bulge with superimposed right foraminal disc protrusion and ligamentum flavum thickening with facet arthropathy contributes to moderate spinal canal stenosis with marked deformation of the thecal sac on the right. There is severe right and moderate left neural foraminal narrowing, with severe impingement on the descending right L3 nerve root.  L3-L4: Previous posterior disc bulge with ligamentum flavum thickening and facet arthrosis contributing to mild spinal canal stenosis and moderate left greater than right neural foraminal narrowing.  L4-L5: Grade 1 anterolisthesis of L4 on L5. Diffuse posterior disc bulge  with ligamentum flavum thickening and advanced facet arthrosis contributes to severe spinal canal stenosis, moderate left and mild right neural foraminal narrowing.  L5-S1: There is posterior disc bulge and ligamentum flavum thickening and facet arthropathy contributes to mild spinal canal stenosis and mild bilateral neural foraminal narrowing.  The paraspinal tissues are within normal limits.  IMPRESSION:  Advanced multilevel lumbar spondyloarthropathy, detailed above.  At L2-L3, a right foraminal disc protrusion and facet arthrosis contribute to moderate/severe canal stenosis and deformation of the right thecal sac, severe right and moderate left neural foraminal narrowing at this level as well as severe impingement on the descending right L3 nerve root.  At L4-L5, grade 1 anterolisthesis contributes to severe spinal canal stenosis, moderate left and mild right neural foraminal narrowing    Complexity Note: Imaging results reviewed. Results shared with Ms. Siefker, using Layman's terms.                         PFSH  Drug: Ms. Gebhardt  reports no history of drug use. Alcohol:  reports no history of alcohol use. Tobacco:  reports that she has never smoked. She has never used smokeless tobacco. Medical:  has a past medical history of Arthritis and Back pain. Family: family history is not on file.  Past Surgical History:  Procedure Laterality Date  .  EPIDURAL BLOCK INJECTION    . NECK SURGERY     Active Ambulatory Problems    Diagnosis Date Noted  . Rheumatoid arthritis, unspecified (HCC) 07/29/2014  . Osteoporosis 07/29/2014  . Nondisplaced fracture of right radial styloid process, initial encounter for closed fracture 08/06/2018  . Chronic radicular lumbar pain 10/31/2019  . Lumbar radiculopathy 10/31/2019  . Lumbar facet arthropathy 10/31/2019  . Lumbar spondylosis 10/31/2019  . Spinal stenosis, lumbar region, with neurogenic claudication 10/31/2019  . Lumbar degenerative disc  disease 10/31/2019  . Bilateral primary osteoarthritis of knee 10/31/2019  . Sacroiliac joint pain 10/31/2019   Resolved Ambulatory Problems    Diagnosis Date Noted  . No Resolved Ambulatory Problems   Past Medical History:  Diagnosis Date  . Arthritis   . Back pain    Assessment  Primary Diagnosis & Pertinent Problem List: The primary encounter diagnosis was Chronic radicular lumbar pain. Diagnoses of Lumbar radiculopathy, Lumbar facet arthropathy, Lumbar spondylosis, Spinal stenosis, lumbar region, with neurogenic claudication, Lumbar degenerative disc disease, Bilateral primary osteoarthritis of knee, Sacroiliac joint pain, Rheumatoid arthritis involving multiple sites, unspecified whether rheumatoid factor present (HCC), and Osteoporosis, unspecified osteoporosis type, unspecified pathological fracture presence were also pertinent to this visit.  Visit Diagnosis (New problems to examiner): 1. Chronic radicular lumbar pain   2. Lumbar radiculopathy   3. Lumbar facet arthropathy   4. Lumbar spondylosis   5. Spinal stenosis, lumbar region, with neurogenic claudication   6. Lumbar degenerative disc disease   7. Bilateral primary osteoarthritis of knee   8. Sacroiliac joint pain   9. Rheumatoid arthritis involving multiple sites, unspecified whether rheumatoid factor present (HCC)   10. Osteoporosis, unspecified osteoporosis type, unspecified pathological fracture presence    Plan of Care (Initial workup plan)  Note: Ms. Geigle was reminded that as per protocol, today's visit has been an evaluation only. We have not taken over the patient's controlled substance management.  General Recommendations: The pain condition that the patient suffers from is best treated with a multidisciplinary approach that involves an increase in physical activity to prevent de-conditioning and worsening of the pain cycle, as well as psychological counseling (formal and/or informal) to address the co-morbid  psychological affects of pain. Treatment will often involve judicious use of pain medications and interventional procedures to decrease the pain, allowing the patient to participate in the physical activity that will ultimately produce long-lasting pain reductions. The goal of the multidisciplinary approach is to return the patient to a higher level of overall function and to restore their ability to perform activities of daily living.  In regards to treatment options, I recommend we optimize the patient's membrane stabilizer, gabapentin.  She is currently taking a dose 300 mg nightly without any side effects.  Discussed increasing to 300 mg 3 times daily in a stepwise fashion.  I instructed her to increase to 300 mg twice daily for 2 to 3 weeks and then 3 times daily thereafter.  Future considerations could include Lyrica and/or Cymbalta for her neuropathic pain.  Patient is instructed to continue tizanidine as prescribed.  It does provide her with pain relief in regards to her lumbar paraspinal spasms.  I commended her on continuing home stretching and exercises which are helpful in maintaining range of motion.  In regards to injection therapies, given the patient's lumbar MRI above which shows right L2-L3 foraminal disc protrusion and severe facet arthrosis contributing to moderate to severe canal stenosis and deformation of the right thecal sac, severe right and  moderate left neuroforaminal narrowing causing severe impingement of the descending right L3 nerve root, we discussed a interlaminar epidural steroid injection at this area.  This could be a future plan as the patient does have osteoporosis and I would like to avoid neuraxial steroid therapy as this could further decrease her bone density.  Patient is lumbar MRI also shows advanced lumbar facet arthropathy at L2-L3, L3-L4, L4-L5, L5-S1.  Interventional option for this could include diagnostic lumbar facet medial branch nerve block with local  anesthetic only followed by radiofrequency ablation.  This is a nonsteroid-based spinal intervention that can be considered down the line.  In regards to opioid therapy, would like to avoid at this moment.  We will try and optimize nonopioid analgesics and interventional therapies before considering any opioid medications.  Opiate medications are being considered, patient will need baseline urine toxicology screen.  No psych assessment needed as patient is low risk.  Pharmacotherapy (current): Medications ordered:  Meds ordered this encounter  Medications  . gabapentin (NEURONTIN) 300 MG capsule    Sig: Take 1 capsule (300 mg total) by mouth 3 (three) times daily.    Dispense:  90 capsule    Refill:  1   Medications administered during this visit: Wynelle Cleveland. Tsang had no medications administered during this visit.    Pharmacological management options:  Opioid Analgesics: The patient was informed that there is no guarantee that she would be a candidate for opioid analgesics. The decision will be made following CDC guidelines. This decision will be based on the results of diagnostic studies, as well as Ms. Klarich's risk profile.   Membrane stabilizer: Increase gabapentin as above.  Future considerations include Cymbalta, Lyrica.  Muscle relaxant: Continue tizanidine as prescribed.  NSAID: To be determined at a later time  Other analgesic(s): To be determined at a later time   Interventional management options: Ms. Lippold was informed that there is no guarantee that she would be a candidate for interventional therapies. The decision will be based on the results of diagnostic studies, as well as Ms. Kizer's risk profile.  Procedure(s) under consideration:  L2-L3 lumbar epidural steroid injection Diagnostic lumbar facet medial branch nerve blocks at L3, L4, L5, S1 with local anesthetic only Possible lumbar radiofrequency ablation/neurotomy.   Provider-requested follow-up: Return in about 5  weeks (around 12/05/2019) for Medication Management, virtual.  No future appointments.  Total duration of non-face-to-face encounter: 45 minutes.  Primary Care Physician: Etheleen Nicks, NP Location: [facility] Note by: Edward Jolly, MD Date: 10/31/2019; Time: 9:37 AM  Note: This dictation was prepared with Dragon dictation. Any transcriptional errors that may result from this process are unintentional.

## 2019-10-31 ENCOUNTER — Encounter: Payer: Self-pay | Admitting: Student in an Organized Health Care Education/Training Program

## 2019-10-31 ENCOUNTER — Ambulatory Visit
Payer: Medicare HMO | Attending: Student in an Organized Health Care Education/Training Program | Admitting: Student in an Organized Health Care Education/Training Program

## 2019-10-31 ENCOUNTER — Other Ambulatory Visit: Payer: Self-pay

## 2019-10-31 DIAGNOSIS — M810 Age-related osteoporosis without current pathological fracture: Secondary | ICD-10-CM | POA: Diagnosis not present

## 2019-10-31 DIAGNOSIS — M47816 Spondylosis without myelopathy or radiculopathy, lumbar region: Secondary | ICD-10-CM | POA: Diagnosis not present

## 2019-10-31 DIAGNOSIS — M170 Bilateral primary osteoarthritis of knee: Secondary | ICD-10-CM | POA: Insufficient documentation

## 2019-10-31 DIAGNOSIS — M5136 Other intervertebral disc degeneration, lumbar region: Secondary | ICD-10-CM | POA: Insufficient documentation

## 2019-10-31 DIAGNOSIS — M069 Rheumatoid arthritis, unspecified: Secondary | ICD-10-CM | POA: Diagnosis not present

## 2019-10-31 DIAGNOSIS — M5416 Radiculopathy, lumbar region: Secondary | ICD-10-CM

## 2019-10-31 DIAGNOSIS — M48062 Spinal stenosis, lumbar region with neurogenic claudication: Secondary | ICD-10-CM | POA: Diagnosis not present

## 2019-10-31 DIAGNOSIS — M51369 Other intervertebral disc degeneration, lumbar region without mention of lumbar back pain or lower extremity pain: Secondary | ICD-10-CM | POA: Insufficient documentation

## 2019-10-31 DIAGNOSIS — G8929 Other chronic pain: Secondary | ICD-10-CM | POA: Diagnosis not present

## 2019-10-31 DIAGNOSIS — M533 Sacrococcygeal disorders, not elsewhere classified: Secondary | ICD-10-CM | POA: Diagnosis not present

## 2019-10-31 HISTORY — DX: Spondylosis without myelopathy or radiculopathy, lumbar region: M47.816

## 2019-10-31 MED ORDER — GABAPENTIN 300 MG PO CAPS: 300.0000 mg | ORAL_CAPSULE | Freq: Three times a day (TID) | ORAL | 1 refills | Status: DC

## 2019-11-14 DIAGNOSIS — M0579 Rheumatoid arthritis with rheumatoid factor of multiple sites without organ or systems involvement: Secondary | ICD-10-CM | POA: Diagnosis not present

## 2019-11-14 DIAGNOSIS — Z79899 Other long term (current) drug therapy: Secondary | ICD-10-CM | POA: Diagnosis not present

## 2019-11-14 DIAGNOSIS — M79641 Pain in right hand: Secondary | ICD-10-CM | POA: Diagnosis not present

## 2019-11-14 DIAGNOSIS — M79642 Pain in left hand: Secondary | ICD-10-CM | POA: Diagnosis not present

## 2019-11-14 DIAGNOSIS — M810 Age-related osteoporosis without current pathological fracture: Secondary | ICD-10-CM | POA: Diagnosis not present

## 2019-11-28 ENCOUNTER — Other Ambulatory Visit: Payer: Self-pay

## 2019-11-28 ENCOUNTER — Encounter: Payer: Self-pay | Admitting: Occupational Therapy

## 2019-11-28 ENCOUNTER — Ambulatory Visit: Payer: Medicare HMO | Attending: Rheumatology | Admitting: Occupational Therapy

## 2019-11-28 DIAGNOSIS — M79642 Pain in left hand: Secondary | ICD-10-CM

## 2019-11-28 DIAGNOSIS — M25531 Pain in right wrist: Secondary | ICD-10-CM | POA: Insufficient documentation

## 2019-11-28 DIAGNOSIS — M79641 Pain in right hand: Secondary | ICD-10-CM | POA: Diagnosis not present

## 2019-11-28 DIAGNOSIS — R208 Other disturbances of skin sensation: Secondary | ICD-10-CM

## 2019-11-28 DIAGNOSIS — R209 Unspecified disturbances of skin sensation: Secondary | ICD-10-CM | POA: Diagnosis not present
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
## 2019-11-28 NOTE — Patient Instructions (Signed)
Contrast at home 2-3 x day  Wrist splint on most all the time - off for ADL's and HEP Fitted with anti ulnar deviation splint for R hand - wear 30 min 3 x day if no issues , increase 1 hour and then 2 hrs over the 1-2 wks   Tendon glides AROM - with focus on extention too Opposition to all digits - making oval - except R 5th unable  Tapping of digits for extention  10 reps  Pain free

## 2019-12-02 ENCOUNTER — Encounter: Payer: Self-pay | Admitting: Student in an Organized Health Care Education/Training Program

## 2019-12-02 ENCOUNTER — Telehealth: Payer: Self-pay

## 2019-12-03 ENCOUNTER — Encounter: Payer: Self-pay | Admitting: Student in an Organized Health Care Education/Training Program

## 2019-12-03 ENCOUNTER — Ambulatory Visit (HOSPITAL_BASED_OUTPATIENT_CLINIC_OR_DEPARTMENT_OTHER): Payer: Medicare HMO | Admitting: Student in an Organized Health Care Education/Training Program

## 2019-12-03 ENCOUNTER — Other Ambulatory Visit: Payer: Self-pay | Admitting: Physician Assistant

## 2019-12-03 ENCOUNTER — Ambulatory Visit
Payer: Medicare HMO | Attending: Student in an Organized Health Care Education/Training Program | Admitting: Student in an Organized Health Care Education/Training Program

## 2019-12-03 ENCOUNTER — Telehealth: Payer: Self-pay | Admitting: *Deleted

## 2019-12-03 ENCOUNTER — Other Ambulatory Visit: Payer: Self-pay

## 2019-12-03 DIAGNOSIS — H9202 Otalgia, left ear: Secondary | ICD-10-CM | POA: Diagnosis not present

## 2019-12-03 DIAGNOSIS — M810 Age-related osteoporosis without current pathological fracture: Secondary | ICD-10-CM | POA: Diagnosis not present

## 2019-12-03 DIAGNOSIS — M5136 Other intervertebral disc degeneration, lumbar region: Secondary | ICD-10-CM

## 2019-12-03 DIAGNOSIS — G8929 Other chronic pain: Secondary | ICD-10-CM | POA: Diagnosis not present

## 2019-12-03 DIAGNOSIS — D849 Immunodeficiency, unspecified: Secondary | ICD-10-CM | POA: Diagnosis not present

## 2019-12-03 DIAGNOSIS — M5116 Intervertebral disc disorders with radiculopathy, lumbar region: Secondary | ICD-10-CM | POA: Diagnosis not present

## 2019-12-03 DIAGNOSIS — M533 Sacrococcygeal disorders, not elsewhere classified: Secondary | ICD-10-CM | POA: Diagnosis not present

## 2019-12-03 DIAGNOSIS — M48062 Spinal stenosis, lumbar region with neurogenic claudication: Secondary | ICD-10-CM

## 2019-12-03 DIAGNOSIS — M47816 Spondylosis without myelopathy or radiculopathy, lumbar region: Secondary | ICD-10-CM

## 2019-12-03 DIAGNOSIS — M069 Rheumatoid arthritis, unspecified: Secondary | ICD-10-CM

## 2019-12-03 DIAGNOSIS — M5416 Radiculopathy, lumbar region: Secondary | ICD-10-CM

## 2019-12-03 DIAGNOSIS — H6123 Impacted cerumen, bilateral: Secondary | ICD-10-CM | POA: Diagnosis not present

## 2019-12-03 DIAGNOSIS — M1990 Unspecified osteoarthritis, unspecified site: Secondary | ICD-10-CM | POA: Diagnosis not present

## 2019-12-03 DIAGNOSIS — M4726 Other spondylosis with radiculopathy, lumbar region: Secondary | ICD-10-CM

## 2019-12-03 DIAGNOSIS — M170 Bilateral primary osteoarthritis of knee: Secondary | ICD-10-CM

## 2019-12-03 DIAGNOSIS — M62838 Other muscle spasm: Secondary | ICD-10-CM | POA: Diagnosis not present

## 2019-12-03 DIAGNOSIS — K219 Gastro-esophageal reflux disease without esophagitis: Secondary | ICD-10-CM | POA: Diagnosis not present

## 2019-12-03 DIAGNOSIS — M51369 Other intervertebral disc degeneration, lumbar region without mention of lumbar back pain or lower extremity pain: Secondary | ICD-10-CM

## 2019-12-03 DIAGNOSIS — H903 Sensorineural hearing loss, bilateral: Secondary | ICD-10-CM | POA: Diagnosis not present

## 2019-12-03 MED ORDER — GABAPENTIN 300 MG PO CAPS: 300.0000 mg | ORAL_CAPSULE | Freq: Three times a day (TID) | ORAL | 2 refills | Status: DC

## 2019-12-03 NOTE — Progress Notes (Signed)
Virtual Encounter - Pain Management PROVIDER NOTE: Information contained herein reflects review and annotations entered in association with encounter. Interpretation of such information and data should be left to medically-trained personnel. Information provided to patient can be located elsewhere in the medical record under "Patient Instructions". Document created using STT-dictation technology, any transcriptional errors that may result from process are unintentional.    Contact & Pharmacy Preferred: [phone] Home: [phone] (home) Mobile: [phone] (mobile) E-mail: [email or web address]  [id number] [place], [place] - [address] [place] [address] Phone: [phone] Fax: [phone]   Pre-screening  Casey [name] offered "in-person" vs "virtual" encounter. She indicated preferring virtual for this encounter.   Reason COVID-19*  Social distancing based on CDC and AMA recommendations.   I contacted Casey [name] on 12/03/2019 via video conference.      I clearly identified myself as Edward Jolly, MD. I verified that I was speaking with the correct person using two identifiers (Name: Casey [name], and date of birth: 06-26-1946).  Consent I sought verbal advanced consent from Casey [name] for virtual visit interactions. I informed Casey [name] of possible security and privacy concerns, risks, and limitations associated with providing "not-in-person" medical evaluation and management services. I also informed Casey [name] of the availability of "in-person" appointments. Finally, I informed her that there would be a charge for the virtual visit and that she could be  personally, fully or partially, financially responsible for it. Casey [name] expressed understanding and agreed to proceed.   Historic Elements   Casey [name] is a 74 y.o. year old, female patient evaluated today after her last encounter by our practice on 12/02/2019. Casey [name]  has a past  medical history of Arthritis and Back pain. She also  has a past surgical history that includes Epidural block injection and Neck surgery. Casey [name] has a current medication list which includes the following prescription(s): adalimumab, alendronate, etodolac, gabapentin, lidocaine, prednisone, tizanidine, and tramadol. She  reports that she has never smoked. She has never used smokeless tobacco. She reports that she does not drink alcohol or use drugs. Casey [name] is allergic to leflunomide and methotrexate.   HPI  Today, she is being contacted for medication management.  Patient's initial visit with me was on 10/31/2019.  At that time we increased her gabapentin from 300 mg nightly to 300 mg 3 times daily which she is currently taking.  She is endorsing improved analgesic benefits with this medication although not to a significant degree.  She denies any side effects such as sedation, drowsiness, cognitive distortion.  We also discussed interventional therapies which we had also done at her first visit but at this point patient wants to hold off.  This is reasonable.  We will refill gabapentin as below for 2 months.   Imaging  US Venous Img Lower Unilateral Right CLINICAL DATA:  Right lower leg swelling for 1 day. No known injury.  EXAM: RIGHT LOWER EXTREMITY VENOUS DOPPLER ULTRASOUND  TECHNIQUE: Gray-scale sonography with graded compression, as well as color Doppler and duplex ultrasound were performed to evaluate the lower extremity deep venous systems from the level of the common femoral vein and including the common femoral, femoral, profunda femoral, popliteal and calf veins including the posterior tibial, peroneal and gastrocnemius veins when visible. The superficial great saphenous vein was also interrogated. Spectral Doppler was utilized to evaluate flow at rest and with distal augmentation maneuvers in the common femoral, femoral and popliteal veins.  COMPARISON:   None.  FINDINGS: Contralateral Common Femoral Vein: Respiratory phasicity is normal and symmetric with the symptomatic side. No evidence of thrombus. Normal compressibility.  Common Femoral Vein: No evidence of thrombus. Normal compressibility, respiratory phasicity and response to augmentation.  Saphenofemoral Junction: No evidence of thrombus. Normal compressibility and flow on color Doppler imaging.  Profunda Femoral Vein: No evidence of thrombus. Normal compressibility and flow on color Doppler imaging.  Femoral Vein: No evidence of thrombus. Normal compressibility, respiratory phasicity and response to augmentation.  Popliteal Vein: No evidence of thrombus. Normal compressibility, respiratory phasicity and response to augmentation.  Calf Veins: No evidence of thrombus. Normal compressibility and flow on color Doppler imaging.  Superficial Great Saphenous Vein: No evidence of thrombus. Normal compressibility.  Venous Reflux:  None.  Other Findings: Small Baker's cysts measuring 4.2 x 0.8 x 1.7 cm noted.  IMPRESSION: No evidence of deep venous thrombosis.  Small Baker's cyst.  Electronically Signed   By: Inge Rise M.D.   On: 08/13/2019 21:34   Assessment  The primary encounter diagnosis was Chronic radicular lumbar pain. Diagnoses of Lumbar radiculopathy, Lumbar facet arthropathy, Spinal stenosis, lumbar region, with neurogenic claudication, Lumbar degenerative disc disease, Lumbar spondylosis, Bilateral primary osteoarthritis of knee, Sacroiliac joint pain, Rheumatoid arthritis involving multiple sites, unspecified whether rheumatoid factor present ([place]), and Osteoporosis, unspecified osteoporosis type, unspecified pathological fracture presence were also pertinent to this visit.  Plan of Care   I am having Casey Hope. [name] maintain her Adalimumab, alendronate, etodolac, predniSONE, tiZANidine, traMADol, lidocaine, and gabapentin.  Pharmacotherapy  (Medications Ordered): Meds ordered this encounter  Medications  . gabapentin (NEURONTIN) 300 MG capsule    Sig: Take 1 capsule (300 mg total) by mouth 3 (three) times daily.    Dispense:  90 capsule    Refill:  2   In regards to injection therapies, given the patient's lumbar MRI above which shows right L2-L3 foraminal disc protrusion and severe facet arthrosis contributing to moderate to severe canal stenosis and deformation of the right thecal sac, severe right and moderate left neuroforaminal narrowing causing severe impingement of the descending right L3 nerve root, we discussed a interlaminar epidural steroid injection at this area.  This could be a future plan as the patient does have osteoporosis and I would like to avoid neuraxial steroid therapy as this could further decrease her bone density.  Patient is lumbar MRI also shows advanced lumbar facet arthropathy at L2-L3, L3-L4, L4-L5, L5-S1.  Interventional option for this could include diagnostic lumbar facet medial branch nerve block with local anesthetic only followed by radiofrequency ablation.  This is a nonsteroid-based spinal intervention that can be considered down the line.  In regards to opioid therapy, would like to avoid at this moment.  We will try and optimize nonopioid analgesics and interventional therapies before considering any opioid medications.  Opiate medications are being considered, patient will need baseline urine toxicology screen.  No psych assessment needed as patient is low risk.   Follow-up plan:   Return in about 3 months (around 03/02/2020) for Medication Management.     Recent Visits Date Type Provider Dept  10/31/19 Office Visit Gillis Santa, MD [facility]  Showing recent visits within past 90 days and meeting all other requirements   Today's Visits Date Type Provider Dept  12/03/19 Office Visit Gillis Santa, MD [facility]  Showing today's visits and meeting all other  requirements   Future Appointments No visits were found meeting these conditions.  Showing future appointments within next 90 days and meeting all other requirements   I discussed the assessment and treatment plan with the patient. The patient was provided an opportunity to ask questions and all were answered. The patient agreed with the plan and demonstrated an understanding of the instructions.  Patient advised to call back or seek an in-person evaluation if the symptoms or condition worsens.  Total duration of non-face-to-face encounter: .  Note by: Edward Jolly, MD Date: 12/03/2019; Time: 8:46 AM

## 2019-12-04 DIAGNOSIS — R200 Anesthesia of skin: Secondary | ICD-10-CM | POA: Diagnosis not present

## 2019-12-09 ENCOUNTER — Ambulatory Visit: Payer: Medicare HMO | Admitting: Occupational Therapy

## 2019-12-10 ENCOUNTER — Ambulatory Visit: Admission: RE | Admit: 2019-12-10 | Payer: Medicare HMO | Source: Ambulatory Visit

## 2019-12-17 ENCOUNTER — Ambulatory Visit
Admission: RE | Admit: 2019-12-17 | Discharge: 2019-12-17 | Disposition: A | Discharge status: Home / Self Care | Payer: Medicare HMO | Source: Ambulatory Visit | Attending: Physician Assistant | Admitting: Physician Assistant

## 2019-12-17 ENCOUNTER — Other Ambulatory Visit: Payer: Self-pay

## 2019-12-17 DIAGNOSIS — H9202 Otalgia, left ear: Secondary | ICD-10-CM | POA: Diagnosis present

## 2019-12-17 DIAGNOSIS — M542 Cervicalgia: Secondary | ICD-10-CM | POA: Diagnosis not present

## 2019-12-17 MED ORDER — IOHEXOL 300 MG/ML  SOLN
75.0000 mL | Freq: Once | INTRAMUSCULAR | Status: AC | PRN
  Administered 2019-12-17: 75 mL via INTRAVENOUS

## 2019-12-19 ENCOUNTER — Other Ambulatory Visit: Payer: Self-pay

## 2019-12-19 ENCOUNTER — Ambulatory Visit: Payer: Medicare HMO | Attending: Rheumatology | Admitting: Occupational Therapy

## 2019-12-19 DIAGNOSIS — M79642 Pain in left hand: Secondary | ICD-10-CM | POA: Diagnosis not present

## 2019-12-19 DIAGNOSIS — R209 Unspecified disturbances of skin sensation: Secondary | ICD-10-CM | POA: Diagnosis not present

## 2019-12-19 DIAGNOSIS — M25531 Pain in right wrist: Secondary | ICD-10-CM | POA: Diagnosis not present

## 2019-12-19 DIAGNOSIS — M79641 Pain in right hand: Secondary | ICD-10-CM | POA: Insufficient documentation

## 2019-12-19 DIAGNOSIS — R208 Other disturbances of skin sensation: Secondary | ICD-10-CM

## 2019-12-19 NOTE — Therapy (Signed)
[facility] [address] [place], [address] Phone: [phone]   Fax:  [phone]  Occupational Therapy Treatment  Patient Details  Name: Casey [name] MRN: [id number] Date of Birth: 08-07-1946 Referring Provider (OT): Jefm Bryant   Encounter Date: 12/19/2019  OT End of Session - 12/19/19 1038    Visit Number  2    Number of Visits  4    Date for OT Re-Evaluation  01/02/20    OT Start Time  1017    OT Stop Time  1058    OT Time Calculation (min)  41 min    Activity Tolerance  Patient tolerated treatment well    Behavior During Therapy  [facility] for tasks assessed/performed       Past Medical History:  Diagnosis Date  . Arthritis   . Asthma   . Back pain     Past Surgical History:  Procedure Laterality Date  . EPIDURAL BLOCK INJECTION    . NECK SURGERY      There were no vitals filed for this visit.  Subjective Assessment - 12/19/19 1035    Subjective   Seen DR Melrose Nakayama 2 wks ago for nerve conduction - sound likes pinch nerve but did hear back from Dr Jefm Bryant - numbness still there, pain in wrist little better - but my wrist brace looks bad - need new on maybe    Pertinent History  Pt was seen by this OT in 2017 for R wrist pain , stiffness and decrease ROM /strength in R hand , and CTS when on computer and driving - pt now for bilateral hand pain - but R wrist and hand worse - pt had shot couple of weeks ago in R wrist , and wrist splints - schedule for nerve conduction test for next week    Patient Stated Goals  Decrease pain and numbness  , increase my motion in R hand/wrist- and prevent L hand getting like R    Currently in Pain?  Yes    Pain Location  Wrist    Pain Orientation  Right    Pain Descriptors / Indicators  Aching    Pain Type  Chronic pain    Pain Onset  More than a month ago    Pain Frequency  Constant         [facility] OT Assessment - 12/19/19 0001      Strength   Right Hand Grip  (lbs)  35    Right Hand Lateral Pinch  12.5 lbs    Right Hand 3 Point Pinch  10 lbs    Left Hand Grip (lbs)  50    Left Hand Lateral Pinch  17 lbs    Left Hand 3 Point Pinch  13 lbs      Right Hand AROM   R Index  MCP 0-90  85 Degrees    R Index PIP 0-100  100 Degrees    R Long  MCP 0-90  80 Degrees    R Long PIP 0-100  95 Degrees    R Ring  MCP 0-90  85 Degrees    R Ring PIP 0-100  100 Degrees    R Little  MCP 0-90  90 Degrees    R Little PIP 0-100  95 Degrees      Left Hand AROM   L Index  MCP 0-90  85 Degrees    L Index PIP 0-100  100 Degrees  L Long  MCP 0-90  85 Degrees    L Long PIP 0-100  100 Degrees    L Ring  MCP 0-90  80 Degrees    L Ring PIP 0-100  100 Degrees    L Little  MCP 0-90  90 Degrees    L Little PIP 0-100  95 Degrees        measurements taken - good progress in AROM and strength - see flow sheet  Pt brings her prefab wrist splints in - not fitting well and broken - fitted with new bilateral wrist extention splint   pt demo understanding and ed on donning, doffing and wearing  Review also anti ulnar deviation splint donning - pt had question         OT Treatments/Exercises (OP) - 12/19/19 0001      RUE Contrast Bath   Time  9 minutes    Comments  prior soft tissue and ROM       LUE Contrast Bath   Time  9 minutes    Comments  prior to ROM and soft tissue       soft tissue mobs done - CT spreads and MC spreads    Tendon glides AROM - with focus on extention too Can wear anti ulnar deviation splint on R hand with tendon glides   need min A for tendon glides on R  Opposition to all digits - making oval - except R 5th unable  Tapping of digits for extention  10 reps  Pain free      OT Education - 12/19/19 1037    Education provided  Yes    Education Details  HEP review and splint wearing    Person(s) Educated  Patient    Methods  Explanation;Demonstration;Tactile cues;Verbal cues;Handout    Comprehension  Verbal cues  required;Returned demonstration;Verbalized understanding       OT Short Term Goals - 11/28/19 1638      OT SHORT TERM GOAL #1   Title  Pt pain on PRWHE improve with at least 10  points    Baseline  Pain at eval 22/50    Time  3    Period  Weeks    Status  New    Target Date  12/19/19        OT Long Term Goals - 11/28/19 1639      OT LONG TERM GOAL #1   Title  Pt to be ind in HEP to increase ROM in hands , wear splints if needed to prevent worsening of CTS /subluxation of tendons over MC's    Baseline  did not follow thru with HEP from 2017 - numbness increase    Time  5    Period  Weeks    Status  New    Target Date  01/02/20      OT LONG TERM GOAL #2   Title  Pt verbalize 3 joint protectoin principles and AE for pt to use to decrease pain and increase ease of use of hands at home    Baseline  need education again - hard time with cutting food, writing , squeeze washcloth , tie shoes, lift pots    Time  5    Period  Weeks    Status  New    Target Date  01/02/20            Plan - 12/19/19 1039    Clinical Impression Statement  Pt return this date , 3  wks since eval - pt has diagnosis of RA and then R wrist pain - ? CTS bilateral - pt show this date increase digits flexion , increase grip and prehension - except 3 point grip on R because of subluxation of R hand extensors at MC's - pt to cont with HEP , splint wearing -also has anti ulnar deviation splint for R hand - pt do admit working more hrs on computer at home for [employer] now that COVID is because there is not much to do - could be why her symptoms got worse the last year or 2    OT Occupational Profile and History  Problem Focused Assessment - Including review of records relating to presenting problem    Occupational performance deficits (Please refer to evaluation for details):  ADL's;IADL's;Play;Leisure    Body Structure / Function / Physical Skills  ADL;Flexibility;ROM;UE functional  use;Strength;Pain;IADL;Sensation;Decreased knowledge of use of DME    Rehab Potential  Fair    Clinical Decision Making  Limited treatment options, no task modification necessary    Comorbidities Affecting Occupational Performance:  May have comorbidities impacting occupational performance    Modification or Assistance to Complete Evaluation   No modification of tasks or assist necessary to complete eval    OT Frequency  Biweekly    OT Duration  2 weeks    OT Treatment/Interventions  Self-care/ADL training;Therapeutic exercise;Splinting;Patient/family education;Contrast Bath;Paraffin;DME and/or AE instruction;Manual Therapy    Plan  progress with pain - and splint wearing    OT Home Exercise Plan  see pt instruction     Consulted and Agree with Plan of Care  Patient       Patient will benefit from skilled therapeutic intervention in order to improve the following deficits and impairments:   Body Structure / Function / Physical Skills: ADL, Flexibility, ROM, UE functional use, Strength, Pain, IADL, Sensation, Decreased knowledge of use of DME       Visit Diagnosis: Pain in right wrist  Pain in left hand  Pain in right hand  Other disturbances of skin sensation    Problem List Patient Active Problem List   Diagnosis Date Noted  . Chronic radicular lumbar pain 10/31/2019  . Lumbar radiculopathy 10/31/2019  . Lumbar facet arthropathy 10/31/2019  . Lumbar spondylosis 10/31/2019  . Spinal stenosis, lumbar region, with neurogenic claudication 10/31/2019  . Lumbar degenerative disc disease 10/31/2019  . Bilateral primary osteoarthritis of knee 10/31/2019  . Sacroiliac joint pain 10/31/2019  . Nondisplaced fracture of right radial styloid process, initial encounter for closed fracture 08/06/2018  . Rheumatoid arthritis, unspecified ([facility]) 07/29/2014  . Osteoporosis 07/29/2014    Oletta Cohn OTR/L,CLT 12/19/2019, 11:02 AM  [facility]  [facility] SPORTS [facility] [address] [place], [address] Phone: [phone]   Fax:  [phone]  Name: Casey [name] MRN: [id number] Date of Birth: 16-Apr-1946

## 2019-12-19 NOTE — Patient Instructions (Signed)
Same HEP  

## 2019-12-20 DIAGNOSIS — J328 Other chronic sinusitis: Secondary | ICD-10-CM | POA: Diagnosis not present

## 2019-12-20 DIAGNOSIS — H9202 Otalgia, left ear: Secondary | ICD-10-CM | POA: Diagnosis not present

## 2019-12-25 DIAGNOSIS — H60549 Acute eczematoid otitis externa, unspecified ear: Secondary | ICD-10-CM | POA: Diagnosis not present

## 2019-12-25 DIAGNOSIS — J328 Other chronic sinusitis: Secondary | ICD-10-CM | POA: Diagnosis not present

## 2020-01-09 ENCOUNTER — Encounter: Payer: Medicare HMO | Admitting: Occupational Therapy

## 2020-01-15 ENCOUNTER — Ambulatory Visit: Payer: Medicare HMO | Attending: Rheumatology | Admitting: Occupational Therapy

## 2020-01-29 ENCOUNTER — Other Ambulatory Visit: Payer: Self-pay

## 2020-01-29 ENCOUNTER — Ambulatory Visit: Payer: Medicare HMO | Attending: Rheumatology | Admitting: Occupational Therapy

## 2020-01-29 DIAGNOSIS — M79642 Pain in left hand: Secondary | ICD-10-CM | POA: Diagnosis not present

## 2020-01-29 DIAGNOSIS — R209 Unspecified disturbances of skin sensation: Secondary | ICD-10-CM | POA: Diagnosis not present

## 2020-01-29 DIAGNOSIS — M25531 Pain in right wrist: Secondary | ICD-10-CM | POA: Insufficient documentation

## 2020-01-29 DIAGNOSIS — R208 Other disturbances of skin sensation: Secondary | ICD-10-CM

## 2020-01-29 DIAGNOSIS — M79641 Pain in right hand: Secondary | ICD-10-CM | POA: Insufficient documentation

## 2020-01-29 NOTE — Patient Instructions (Signed)
See note

## 2020-01-29 NOTE — Therapy (Signed)
[facility] [address] [place], [address] Phone: [phone]   Fax:  [phone]  Occupational Therapy Treatment/discharge  Patient Details  Name: Casey [name] MRN: [id number] Date of Birth: 1946-10-27 Referring Provider (OT): Gavin Potters   Encounter Date: 01/29/2020  OT End of Session - 01/29/20 1613    Visit Number  3    Number of Visits  3    Date for OT Re-Evaluation  01/29/20    OT Start Time  1431    OT Stop Time  1515    OT Time Calculation (min)  44 min    Activity Tolerance  Patient tolerated treatment well    Behavior During Therapy  [facility] for tasks assessed/performed       Past Medical History:  Diagnosis Date  . Arthritis   . Asthma   . Back pain     Past Surgical History:  Procedure Laterality Date  . EPIDURAL BLOCK INJECTION    . NECK SURGERY      There were no vitals filed for this visit.  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 - hands doing okay - except the numbness and some pain at the wrist but doing okay - wearing wrist splint sleeping and during day    Pertinent History  Pt was seen by this OT in 2017 for R wrist pain , stiffness and decrease ROM /strength in R hand , and CTS when on computer and driving - pt now for bilateral hand pain - but R wrist and hand worse - pt had shot couple of weeks ago in R wrist , and wrist splints - schedule for nerve conduction test for next week    Patient Stated Goals  Decrease pain and numbness  , increase my motion in R hand/wrist- and prevent L hand getting like R    Currently in Pain?  Yes    Pain Score  3     Pain Location  Wrist    Pain Orientation  Left;Right    Pain Descriptors / Indicators  Aching    Pain Type  Chronic pain    Pain Onset  More than a month ago    Pain Frequency  Intermittent         [facility] OT Assessment -  01/29/20 0001      Strength   Right Hand Grip (lbs)  40    Right Hand Lateral Pinch  16 lbs    Right Hand 3 Point Pinch  10 lbs    Left Hand Grip (lbs)  50    Left Hand Lateral Pinch  19 lbs    Left Hand 3 Point Pinch  13 lbs      Right Hand AROM   R Index  MCP 0-90  90 Degrees    R Index PIP 0-100  100 Degrees    R Long  MCP 0-90  85 Degrees    R Long PIP 0-100  95 Degrees    R Ring  MCP 0-90  85 Degrees    R Ring PIP 0-100  100 Degrees    R Little  MCP 0-90  90 Degrees    R Little PIP 0-100  95 Degrees      Left Hand AROM   L Index  MCP 0-90  90 Degrees  L Index PIP 0-100  95 Degrees    L Long  MCP 0-90  90 Degrees    L Long PIP 0-100  100 Degrees    L Ring  MCP 0-90  90 Degrees    L Ring PIP 0-100  100 Degrees    L Little  MCP 0-90  90 Degrees    L Little PIP 0-100  100 Degrees     WRIST R ext 38, Flexion 42, L ext and flexion 50 degrees  Measurements taken - see flow sheet- again showed  progress in grip and prehension - since [facility] great progress  And AROM in digist flexion WNL - but cont to show ulnar dev with subluxation of 5th on R hand  Review again donning and wearing of anti ulnar deviation splint on R hand  And bilateral wrist splints - but only wear night  Time and activities that cause pain during day - not all the time          OT Treatments/Exercises (OP) - 01/29/20 0001      Moist Heat Therapy   Number Minutes Moist Heat  6 Minutes    Moist Heat Location  Wrist;Hand   prior to review of HEP       Tendon glides AROM - with focus on extention too RD of digits -AROM on table 10 reps pain free   need min A for tendon glides on R  Opposition to all digits - making oval - except R 5th unable  Add this date AROM for wrist flexion ,ext , RD, UD - after some heat - 10 reps  limited in ROM        OT Education - 01/29/20 1612    Education provided  Yes    Education Details  HEP review and splint wearing    Person(s) Educated  Patient     Methods  Explanation;Demonstration;Tactile cues;Verbal cues;Handout    Comprehension  Verbal cues required;Returned demonstration;Verbalized understanding       OT Short Term Goals - 01/29/20 1617      OT SHORT TERM GOAL #1   Title  Pt pain on PRWHE improve with at least 10  points    Baseline  Pain at eval 22/50 - numbness in hands  more than pain - pain in wrists 3/10 but not consistant    Status  Achieved      OT SHORT TERM GOAL #2   Title  Digits AROM in flexion and extention in R hand improve with 5-10 degrees to increase grip and able to put hand in pocket    Baseline  See flowsheet    Status  Achieved        OT Long Term Goals - 01/29/20 1618      OT LONG TERM GOAL #1   Title  Pt to be ind in HEP to increase ROM in hands , wear splints if needed to prevent worsening of CTS /subluxation of tendons over MC's    Baseline  pt Ind in HEP - AROM for digits flexion increase and grip /prehension strength- splint for subluxation of ext tendons over MC on R hand , wrist splints    Status  Achieved      OT LONG TERM GOAL #2   Title  Pt verbalize 3 joint protectoin principles and AE for pt to use to decrease pain and increase ease of use of hands at home    Baseline  Pt report using larger joint -and  not holdings things to tight - use splint to decrease pain - use anti ulnar deviation splint on computer    Status  Achieved            Plan - 01/29/20 1613    Clinical Impression Statement  Pt is was seen since Dec 31st , 2020 for eval and 2 visits- diagnosis of RA and wrist pain - pt showed progress in digits  flexion in bilateral hands , increase grip and prehension strength - but pt do show some subluxation in R hand over MC's - pt fitted with anti ulanr deviation splint to wear during day some ,and night time wrist splints - and then during day with activities that cause wrist pain - pt do show R wrist AROM worse than L - add AROM for wrist to HEP - pt doing very well with HEP -  and can cont with HEP and have appt with Dr Jefm Bryant in 2 wks for follow up on results for nerve conduction test - ? cervical    OT Occupational Profile and History  Problem Focused Assessment - Including review of records relating to presenting problem    Occupational performance deficits (Please refer to evaluation for details):  ADL's;IADL's;Play;Leisure    Body Structure / Function / Physical Skills  ADL;Flexibility;ROM;UE functional use;Strength;Pain;IADL;Sensation;Decreased knowledge of use of DME    OT Treatment/Interventions  Self-care/ADL training;Therapeutic exercise;Splinting;Patient/family education;Contrast Bath;Paraffin;DME and/or AE instruction;Manual Therapy    Plan  discharge with homeprogram and splints    Consulted and Agree with Plan of Care  Patient       Patient will benefit from skilled therapeutic intervention in order to improve the following deficits and impairments:   Body Structure / Function / Physical Skills: ADL, Flexibility, ROM, UE functional use, Strength, Pain, IADL, Sensation, Decreased knowledge of use of DME       Visit Diagnosis: Pain in right wrist - Plan: Ot plan of care cert/re-cert  Pain in left hand - Plan: Ot plan of care cert/re-cert  Pain in right hand - Plan: Ot plan of care cert/re-cert  Other disturbances of skin sensation - Plan: Ot plan of care cert/re-cert    Problem List Patient Active Problem List   Diagnosis Date Noted  . Chronic radicular lumbar pain 10/31/2019  . Lumbar radiculopathy 10/31/2019  . Lumbar facet arthropathy 10/31/2019  . Lumbar spondylosis 10/31/2019  . Spinal stenosis, lumbar region, with neurogenic claudication 10/31/2019  . Lumbar degenerative disc disease 10/31/2019  . Bilateral primary osteoarthritis of knee 10/31/2019  . Sacroiliac joint pain 10/31/2019  . Nondisplaced fracture of right radial styloid process, initial encounter for closed fracture 08/06/2018  . Rheumatoid arthritis, unspecified ([place])  07/29/2014  . Osteoporosis 07/29/2014    Rosalyn Gess OTR/L,CLT 01/29/2020, 4:23 PM  [facility] [address], [place], [address] Phone: [phone]   Fax:  [phone]  Name: Casey [name] MRN: [id number] Date of Birth: 1946-10-25

## 2020-02-05 DIAGNOSIS — Z79899 Other long term (current) drug therapy: Secondary | ICD-10-CM | POA: Diagnosis not present

## 2020-02-05 DIAGNOSIS — M0579 Rheumatoid arthritis with rheumatoid factor of multiple sites without organ or systems involvement: Secondary | ICD-10-CM | POA: Diagnosis not present

## 2020-02-14 DIAGNOSIS — M0579 Rheumatoid arthritis with rheumatoid factor of multiple sites without organ or systems involvement: Secondary | ICD-10-CM | POA: Diagnosis not present

## 2020-02-14 DIAGNOSIS — Z79899 Other long term (current) drug therapy: Secondary | ICD-10-CM | POA: Diagnosis not present

## 2020-02-14 DIAGNOSIS — M810 Age-related osteoporosis without current pathological fracture: Secondary | ICD-10-CM | POA: Diagnosis not present

## 2020-02-14 DIAGNOSIS — G5601 Carpal tunnel syndrome, right upper limb: Secondary | ICD-10-CM | POA: Diagnosis not present

## 2020-02-14 DIAGNOSIS — G5602 Carpal tunnel syndrome, left upper limb: Secondary | ICD-10-CM | POA: Diagnosis not present

## 2020-02-26 ENCOUNTER — Encounter: Payer: Self-pay | Admitting: Student in an Organized Health Care Education/Training Program

## 2020-02-26 ENCOUNTER — Ambulatory Visit
Payer: Medicare HMO | Attending: Student in an Organized Health Care Education/Training Program | Admitting: Student in an Organized Health Care Education/Training Program

## 2020-02-26 ENCOUNTER — Other Ambulatory Visit: Payer: Self-pay

## 2020-02-26 VITALS — BP 137/83 | HR 91 | Temp 98.6°F | Resp 16 | Ht 64.0 in | Wt 155.0 lb

## 2020-02-26 DIAGNOSIS — M47816 Spondylosis without myelopathy or radiculopathy, lumbar region: Secondary | ICD-10-CM | POA: Diagnosis not present

## 2020-02-26 DIAGNOSIS — G8929 Other chronic pain: Secondary | ICD-10-CM | POA: Diagnosis not present

## 2020-02-26 DIAGNOSIS — M170 Bilateral primary osteoarthritis of knee: Secondary | ICD-10-CM

## 2020-02-26 DIAGNOSIS — M5416 Radiculopathy, lumbar region: Secondary | ICD-10-CM

## 2020-02-26 DIAGNOSIS — M48062 Spinal stenosis, lumbar region with neurogenic claudication: Secondary | ICD-10-CM | POA: Diagnosis not present

## 2020-02-26 DIAGNOSIS — M533 Sacrococcygeal disorders, not elsewhere classified: Secondary | ICD-10-CM

## 2020-02-26 MED ORDER — GABAPENTIN 300 MG PO CAPS: 300.0000 mg | ORAL_CAPSULE | Freq: Three times a day (TID) | ORAL | 5 refills | Status: DC

## 2020-02-26 NOTE — Progress Notes (Signed)
PROVIDER NOTE: Information contained herein reflects review and annotations entered in association with encounter. Interpretation of such information and data should be left to medically-trained personnel. Information provided to patient can be located elsewhere in the medical record under "Patient Instructions". Document created using STT-dictation technology, any transcriptional errors that may result from process are unintentional.    Patient: Casey [name]  Service Category: E/M  Provider: Gillis Santa, MD  DOB: May 02, 1946  DOS: 02/26/2020  Referring Provider: Verita Lamb, NP  MRN: [id number]  Setting: Ambulatory outpatient  PCP: Verita Lamb, NP  Type: Established Patient  Specialty: Interventional Pain Management    Location: Office  Delivery: Face-to-face     Primary Reason(s) for Visit: Encounter for prescription drug management. (Level of risk: moderate)  CC: Pain (bilateral buttock)  HPI  [name] is a 74 y.o. year old, female patient, who comes today for a medication management evaluation. She has Rheumatoid arthritis, unspecified ([place]); Osteoporosis; Nondisplaced fracture of right radial styloid process, initial encounter for closed fracture; Chronic radicular lumbar pain; Lumbar radiculopathy; Lumbar facet arthropathy; Lumbar spondylosis; Spinal stenosis, lumbar region, with neurogenic claudication; Lumbar degenerative disc disease; Bilateral primary osteoarthritis of knee; and Sacroiliac joint pain on their problem list. Her primarily concern today is the Pain (bilateral buttock)  Pain Assessment: Location: Right, Left Buttocks Radiating: legs at times Onset: More than a month ago Duration: Chronic pain Quality: Burning, Constant, Aching, Stabbing Severity: 9 /10 (subjective, self-reported pain score)  Note: Clear symptom exaggeration. Reported level of pain is not compatible with clinical observations.                         When using our objective Pain Scale, levels  between 6 and 10/10 are said to belong in an emergency room, as it progressively worsens from a 6/10, described as severely limiting, requiring emergency care not usually available at an outpatient pain management facility. At a 6/10 level, communication becomes difficult and requires great effort. Assistance to reach the emergency department may be required. Facial flushing and profuse sweating along with potentially dangerous increases in heart rate and blood pressure will be evident. Effect on ADL:   Timing: Constant Modifying factors: gabapentin, Bayer back and pain BP: 137/83  HR: 91  [name] was last scheduled for an appointment on 76/03/2020 for medication management. During today's appointment we reviewed [name]'s chronic pain status, as well as her outpatient medication regimen.   Patient continues to endorse low back pain that radiates into her right hip, right buttock and right lateral thigh.  She is on gabapentin 300 mg 3 times a day.  She also utilizes tizanidine as needed.  She does find benefit with gabapentin to a certain extent without any significant side effects such as sedation or lower extremity swelling.  I did offer the patient a reduced steroid dose right lumbar epidural steroid injection at L2-L3 given her disc herniation at that level that could be impacting the right L3 nerve root which could be contributing to her pain symptoms.  Patient is not interested in an injection at this moment.  She would like to avoid opioid medications as well which I agree with.  She states that overall her pain is well managed although she does have some days that are more difficult than others.  Laboratory Chemistry Profile   Renal No results found for: BUN, CREATININE, LABCREA, BCR, GFR, GFRAA, GFRNONAA, SPECGRAV, PHUR, PROTEINUR   Electrolytes No results found for:  NA, K, CL, CALCIUM, MG, PHOS   Hepatic No results found for: AST, ALT, ALBUMIN, ALKPHOS, AMYLASE, LIPASE, AMMONIA    ID No results found for: LYMEIGGIGMAB, HIV, SARSCOV2NAA, STAPHAUREUS, MRSAPCR, HCVAB, PREGTESTUR, RMSFIGG, QFVRPH1IGG, QFVRPH2IGG, LYMEIGGIGMAB   Bone No results found for: VD25OH, HB716RC7ELF, YB0175ZW2, HE5277OE4, 25OHVITD1, 25OHVITD2, 25OHVITD3, TESTOFREE, TESTOSTERONE   Endocrine No results found for: GLUCOSE, GLUCOSEU, HGBA1C, TSH, FREET4, TESTOFREE, TESTOSTERONE, SHBG, ESTRADIOL, ESTRADIOLPCT, ESTRADIOLFRE, LABPREG, ACTH, CRTSLPL, UCORFRPERLTR, UCORFRPERDAY, CORTISOLBASE, LABPREG   Neuropathy No results found for: VITAMINB12, FOLATE, HGBA1C, HIV   CNS No results found for: COLORCSF, APPEARCSF, RBCCOUNTCSF, WBCCSF, POLYSCSF, LYMPHSCSF, EOSCSF, PROTEINCSF, GLUCCSF, JCVIRUS, CSFOLI, IGGCSF, LABACHR, ACETBL, LABACHR, ACETBL   Inflammation (CRP: Acute  ESR: Chronic) No results found for: CRP, ESRSEDRATE, LATICACIDVEN   Rheumatology No results found for: RF, ANA, LABURIC, URICUR, LYMEIGGIGMAB, LYMEABIGMQN, HLAB27   Coagulation No results found for: INR, LABPROT, APTT, PLT, DDIMER, LABHEMA, VITAMINK1, AT3   Cardiovascular No results found for: BNP, CKTOTAL, CKMB, TROPONINI, HGB, HCT, LABVMA, EPIRU, EPINEPH24HUR, NOREPRU, NOREPI24HUR, DOPARU, DOPAM24HRUR   Screening No results found for: SARSCOV2NAA, COVIDSOURCE, STAPHAUREUS, MRSAPCR, HCVAB, HIV, PREGTESTUR   Cancer No results found for: CEA, CA125, LABCA2   Allergens No results found for: ALMOND, APPLE, ASPARAGUS, AVOCADO, BANANA, BARLEY, BASIL, BAYLEAF, GREENBEAN, LIMABEAN, WHITEBEAN, BEEFIGE, REDBEET, BLUEBERRY, BROCCOLI, CABBAGE, MELON, CARROT, CASEIN, CASHEWNUT, CAULIFLOWER, CELERY     Note: Lab results reviewed.   Recent Diagnostic Imaging Results  CT SOFT TISSUE NECK W CONTRAST CLINICAL DATA:  Intermittent pain behind left ear  EXAM: CT NECK WITH CONTRAST  TECHNIQUE: Multidetector CT imaging of the neck was performed using the standard protocol following the bolus administration of intravenous contrast.  CONTRAST:   44m OMNIPAQUE IOHEXOL 300 MG/ML  SOLN  COMPARISON:  None.  FINDINGS: Pharynx and larynx: Unremarkable.  Salivary glands: Atrophic or absent left submandibular gland. Otherwise unremarkable.  Thyroid: Unremarkable.  Lymph nodes: There are no pathologically enlarged lymph nodes.  Vascular: Major neck vessels are patent.  Limited intracranial: No abnormal enhancement.  Visualized orbits: Unremarkable.  Mastoids and visualized paranasal sinuses: Circumferential left maxillary sinus mucosal thickening. Mild ethmoid mucosal thickening. Mastoid air cells are clear.  Skeleton: Multilevel degenerative changes of the cervical spine. There is severe canal stenosis at C4-C5. Multilevel neural foraminal stenosis is present.  Upper chest: Pleuroparenchymal scarring at the lung apices.  Other: No evidence of substantial degenerative change at the left temporomandibular joint. Left middle ear and external auditory canal appear unremarkable.  IMPRESSION: No neck mass or adenopathy.  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  Electronically Signed   By: PMacy MisM.D.   On: 12/17/2019 09:36  Complexity Note: Imaging results reviewed. Results shared with Ms. Maillet, using Layman's terms.                               Meds   Current Outpatient Medications:  .  Adalimumab 40 MG/0.8ML PNKT, Inject 0.8 mLs into the skin every 14 (fourteen) days., Disp: , Rfl:  .  alendronate (FOSAMAX) 70 MG tablet, Take 70 mg by mouth once a week., Disp: , Rfl:  .  Aspirin-Caffeine (BAYER BACK & BODY) 500-32.5 MG TABS, Take 1 tablet by mouth 2 (two) times daily., Disp: , Rfl:  .  etodolac (LODINE) 400 MG tablet, Take 400 mg by mouth 2 (two) times daily as needed., Disp: , Rfl:  .  gabapentin (NEURONTIN) 300 MG capsule, Take 1 capsule (300 mg total) by  mouth 3 (three) times daily., Disp: 90 capsule, Rfl: 5 .  lidocaine (LIDODERM) 5 %, Place onto the skin., Disp: , Rfl:  .  predniSONE (DELTASONE) 5 MG  tablet, Take 2.5 mg by mouth daily., Disp: , Rfl:  .  tiZANidine (ZANAFLEX) 4 MG tablet, Take 4 mg by mouth 3 (three) times daily as needed. Taking 1 at night, Disp: , Rfl:  .  traMADol (ULTRAM) 50 MG tablet, Take 50 mg by mouth 2 (two) times daily as needed. States she is not taking, Disp: , Rfl:   ROS  Constitutional: Denies any fever or chills Gastrointestinal: No reported hemesis, hematochezia, vomiting, or acute GI distress Musculoskeletal: Denies any acute onset joint swelling, redness, loss of ROM, or weakness Neurological: No reported episodes of acute onset apraxia, aphasia, dysarthria, agnosia, amnesia, paralysis, loss of coordination, or loss of consciousness  Allergies  Ms. Pulcini is allergic to leflunomide and methotrexate.  PFSH  Drug: Ms. Sciara  reports no history of drug use. Alcohol:  reports no history of alcohol use. Tobacco:  reports that she has never smoked. She has never used smokeless tobacco. Medical:  has a past medical history of Arthritis, Asthma, and Back pain. Surgical: Ms. Gentzler  has a past surgical history that includes Epidural block injection and Neck surgery. Family: family history is not on file.  Constitutional Exam  General appearance: Well nourished, well developed, and well hydrated. In no apparent acute distress Vitals:   02/26/20 0839  BP: 137/83  Pulse: 91  Resp: 16  Temp: 98.6 F (37 C)  TempSrc: Oral  SpO2: 99%  Weight: 155 lb (70.3 kg)  Height: 5' 4"  (1.626 m)   BMI Assessment: Estimated body mass index is 26.61 kg/m as calculated from the following:   Height as of this encounter: 5' 4"  (1.626 m).   Weight as of this encounter: 155 lb (70.3 kg).  BMI interpretation table: BMI level Category Range association with higher incidence of chronic pain  <18 kg/m2 Underweight   18.5-24.9 kg/m2 Ideal body weight   25-29.9 kg/m2 Overweight Increased incidence by 20%  30-34.9 kg/m2 Obese (Class I) Increased incidence by 68%  35-39.9  kg/m2 Severe obesity (Class II) Increased incidence by 136%  >40 kg/m2 Extreme obesity (Class III) Increased incidence by 254%   Patient's current BMI Ideal Body weight  Body mass index is 26.61 kg/m. Ideal body weight: 54.7 kg (120 lb 9.5 oz) Adjusted ideal body weight: 60.9 kg (134 lb 5.7 oz)   BMI Readings from Last 4 Encounters:  02/26/20 26.61 kg/m  08/13/19 24.80 kg/m  07/26/19 26.57 kg/m  07/24/19 26.57 kg/m   Wt Readings from Last 4 Encounters:  02/26/20 155 lb (70.3 kg)  08/13/19 140 lb (63.5 kg)  07/26/19 150 lb (68 kg)  07/24/19 150 lb (68 kg)    Psych/Mental status: Alert, oriented x 3 (person, place, & time)       Eyes: PERLA Respiratory: No evidence of acute respiratory distress  Cervical Spine Exam  Skin & Axial Inspection: No masses, redness, edema, swelling, or associated skin lesions Alignment: Symmetrical Functional ROM: Unrestricted ROM      Stability: No instability detected Muscle Tone/Strength: Functionally intact. No obvious neuro-muscular anomalies detected. Sensory (Neurological): Unimpaired Palpation: No palpable anomalies              Upper Extremity (UE) Exam    Side: Right upper extremity  Side: Left upper extremity  Skin & Extremity Inspection: Skin color, temperature, and hair growth are  WNL. No peripheral edema or cyanosis. No masses, redness, swelling, asymmetry, or associated skin lesions. No contractures.  Skin & Extremity Inspection: Skin color, temperature, and hair growth are WNL. No peripheral edema or cyanosis. No masses, redness, swelling, asymmetry, or associated skin lesions. No contractures.  Functional ROM: Unrestricted ROM          Functional ROM: Unrestricted ROM          Muscle Tone/Strength: Functionally intact. No obvious neuro-muscular anomalies detected.  Muscle Tone/Strength: Functionally intact. No obvious neuro-muscular anomalies detected.  Sensory (Neurological): Unimpaired          Sensory (Neurological): Unimpaired           Palpation: No palpable anomalies              Palpation: No palpable anomalies              Provocative Test(s):  Phalen's test: deferred Tinel's test: deferred Apley's scratch test (touch opposite shoulder):  Action 1 (Across chest): deferred Action 2 (Overhead): deferred Action 3 (LB reach): deferred   Provocative Test(s):  Phalen's test: deferred Tinel's test: deferred Apley's scratch test (touch opposite shoulder):  Action 1 (Across chest): deferred Action 2 (Overhead): deferred Action 3 (LB reach): deferred    Thoracic Spine Area Exam  Skin & Axial Inspection: No masses, redness, or swelling Alignment: Symmetrical Functional ROM: Unrestricted ROM Stability: No instability detected Muscle Tone/Strength: Functionally intact. No obvious neuro-muscular anomalies detected. Sensory (Neurological): Unimpaired Muscle strength & Tone: No palpable anomalies  Lumbar Exam  Skin & Axial Inspection: No masses, redness, or swelling Alignment: Symmetrical Functional ROM: Decreased ROM affecting both sides Stability: No instability detected Muscle Tone/Strength: Functionally intact. No obvious neuro-muscular anomalies detected. Sensory (Neurological): Dermatomal pain pattern   Gait & Posture Assessment  Ambulation: Unassisted Gait: Relatively normal for age and body habitus Posture: WNL   Lower Extremity Exam    Side: Right lower extremity  Side: Left lower extremity  Stability: No instability observed          Stability: No instability observed          Skin & Extremity Inspection: Skin color, temperature, and hair growth are WNL. No peripheral edema or cyanosis. No masses, redness, swelling, asymmetry, or associated skin lesions. No contractures.  Skin & Extremity Inspection: Skin color, temperature, and hair growth are WNL. No peripheral edema or cyanosis. No masses, redness, swelling, asymmetry, or associated skin lesions. No contractures.  Functional ROM: Unrestricted  ROM                  Functional ROM: Unrestricted ROM                  Muscle Tone/Strength: Functionally intact. No obvious neuro-muscular anomalies detected.  Muscle Tone/Strength: Functionally intact. No obvious neuro-muscular anomalies detected.  Sensory (Neurological): Unimpaired        Sensory (Neurological): Unimpaired        DTR: Patellar: deferred today Achilles: deferred today Plantar: deferred today  DTR: Patellar: deferred today Achilles: deferred today Plantar: deferred today  Palpation: No palpable anomalies  Palpation: No palpable anomalies   Assessment   Status Diagnosis  Controlled Controlled Controlled 1. Chronic radicular lumbar pain   2. Lumbar radiculopathy   3. Lumbar facet arthropathy   4. Spinal stenosis, lumbar region, with neurogenic claudication   5. Bilateral primary osteoarthritis of knee   6. Sacroiliac joint pain      Plan of Care  Pharmacotherapy (Medications Ordered): Meds ordered  this encounter  Medications  . gabapentin (NEURONTIN) 300 MG capsule    Sig: Take 1 capsule (300 mg total) by mouth 3 (three) times daily.    Dispense:  90 capsule    Refill:  5   As needed lumbar epidural steroid injection  Planned follow-up:   Return if symptoms worsen or fail to improve.   Recent Visits Date Type Provider Dept  12/03/19 Office Visit Gillis Santa, MD [facility]  Showing recent visits within past 90 days and meeting all other requirements   Today's Visits Date Type Provider Dept  02/26/20 Office Visit Gillis Santa, MD [facility]  Showing today's visits and meeting all other requirements   Future Appointments No visits were found meeting these conditions.  Showing future appointments within next 90 days and meeting all other requirements   Primary Care Physician: Verita Lamb, NP Location: [facility] Note by: Gillis Santa, MD Date: 02/26/2020; Time: 10:51 AM  Note: This  dictation was prepared with Dragon dictation. Any transcriptional errors that may result from this process are unintentional.

## 2020-02-26 NOTE — Progress Notes (Signed)
Safety precautions to be maintained throughout the outpatient stay will include: orient to surroundings, keep bed in low position, maintain call bell within reach at all times, provide assistance with transfer out of bed and ambulation.  

## 2020-02-27 ENCOUNTER — Encounter: Payer: Medicare HMO | Admitting: Student in an Organized Health Care Education/Training Program

## 2020-03-05 ENCOUNTER — Other Ambulatory Visit: Payer: Self-pay

## 2020-03-09 ENCOUNTER — Encounter: Payer: Self-pay | Admitting: Primary Care

## 2020-03-09 ENCOUNTER — Ambulatory Visit (INDEPENDENT_AMBULATORY_CARE_PROVIDER_SITE_OTHER)
Admission: RE | Admit: 2020-03-09 | Discharge: 2020-03-09 | Disposition: A | Discharge status: Home / Self Care | Payer: Medicare HMO | Source: Ambulatory Visit | Attending: Primary Care | Admitting: Primary Care

## 2020-03-09 ENCOUNTER — Other Ambulatory Visit: Payer: Self-pay

## 2020-03-09 ENCOUNTER — Ambulatory Visit (INDEPENDENT_AMBULATORY_CARE_PROVIDER_SITE_OTHER): Payer: Medicare HMO | Admitting: Primary Care

## 2020-03-09 VITALS — BP 134/82 | HR 88 | Temp 97.0°F | Ht 64.0 in | Wt 152.0 lb

## 2020-03-09 DIAGNOSIS — R918 Other nonspecific abnormal finding of lung field: Secondary | ICD-10-CM | POA: Diagnosis not present

## 2020-03-09 DIAGNOSIS — M47816 Spondylosis without myelopathy or radiculopathy, lumbar region: Secondary | ICD-10-CM

## 2020-03-09 DIAGNOSIS — M25562 Pain in left knee: Secondary | ICD-10-CM

## 2020-03-09 DIAGNOSIS — M810 Age-related osteoporosis without current pathological fracture: Secondary | ICD-10-CM

## 2020-03-09 DIAGNOSIS — M25462 Effusion, left knee: Secondary | ICD-10-CM | POA: Diagnosis not present

## 2020-03-09 DIAGNOSIS — M069 Rheumatoid arthritis, unspecified: Secondary | ICD-10-CM | POA: Diagnosis not present

## 2020-03-09 DIAGNOSIS — M170 Bilateral primary osteoarthritis of knee: Secondary | ICD-10-CM

## 2020-03-09 DIAGNOSIS — J984 Other disorders of lung: Secondary | ICD-10-CM

## 2020-03-09 DIAGNOSIS — M5136 Other intervertebral disc degeneration, lumbar region: Secondary | ICD-10-CM

## 2020-03-09 NOTE — Assessment & Plan Note (Signed)
Last bone density reviewed from October 2020, compliant to alendronate.

## 2020-03-09 NOTE — Assessment & Plan Note (Signed)
Following with rheumatology, continue current regimen. 

## 2020-03-09 NOTE — Patient Instructions (Signed)
Complete xray(s) prior to leaving today. I will notify you of your results once received.  Take your etodolac 400 mg every 6-8 hours as needed for pain and inflammation.  It was a pleasure to meet you today! Please don't hesitate to call or message me with any questions. Welcome to [facility]!

## 2020-03-09 NOTE — Progress Notes (Signed)
Subjective:    Patient ID: Casey [name], female    DOB: Apr 17, 1946, 74 y.o.   MRN: [id number]  HPI  This visit occurred during the SARS-CoV-2 public health emergency.  Safety protocols were in place, including screening questions prior to the visit, additional usage of staff PPE, and extensive cleaning of exam room while observing appropriate contact time as indicated for disinfecting solutions.   [name] is a 74 year old female who presents today to establish care and discuss the problems mentioned below. Will obtain/review records.  1) Rheumatoid Arthritis: Currently managed on Adalimumab bi-weekly injections, prednisone 2.5 mg daily. Following with rheumatology.   2) Chronic Pain/Arhtritis: Chronic lumbar pain with history of spinal stenosis. Chronic knee pain. Currently managed on gabapentin 300 mg TID, tizanidine 4 mg TID PRN, Tramadol 50 mg PRN, etodolac 400 mg PRN. Following with pain management. She may undergo lumbar spinal stenosis surgery.  She did slip and fell last night, hit her left knee and has noticed swelling and pain since. She took a dose of her etodolac last night and has noticed some improvement. She is ambulatory but with pain.  3) Osteoporosis: Currently managed on alendronate 70 mg weekly. Her last bone density scan was in October 2020. She may start Forteo injections daily for the next 6 months, to prepare her for potentially for spinal stenosis surgery.   BP Readings from Last 3 Encounters:  03/09/20 134/82  02/26/20 137/83  08/13/19 (!) 195/83     Review of Systems  Respiratory: Negative for shortness of breath.   Cardiovascular: Negative for chest pain.  Musculoskeletal: Positive for arthralgias and joint swelling.  Neurological: Negative for dizziness.       Past Medical History:  Diagnosis Date  . Chronic back pain   . Osteoporosis   . Rheumatoid arthritis ([place])      Social History   Socioeconomic History  . Marital status: Married   Spouse name: Not on file  . Number of children: Not on file  . Years of education: Not on file  . Highest education level: Not on file  Occupational History  . Not on file  Tobacco Use  . Smoking status: Never Smoker  . Smokeless tobacco: Never Used  Substance and Sexual Activity  . Alcohol use: Never  . Drug use: Never  . Sexual activity: Not on file  Other Topics Concern  . Not on file  Social History Narrative  . Not on file   Social Determinants of Health   Financial Resource Strain:   . Difficulty of Paying Living Expenses:   Food Insecurity:   . Worried About Charity fundraiser in the Last Year:   . Arboriculturist in the Last Year:   Transportation Needs:   . Film/video editor (Medical):   Marland Kitchen Lack of Transportation (Non-Medical):   Physical Activity:   . Days of Exercise per Week:   . Minutes of Exercise per Session:   Stress:   . Feeling of Stress :   Social Connections:   . Frequency of Communication with Friends and Family:   . Frequency of Social Gatherings with Friends and Family:   . Attends Religious Services:   . Active Member of Clubs or Organizations:   . Attends Archivist Meetings:   Marland Kitchen Marital Status:   Intimate Partner Violence:   . Fear of Current or Ex-Partner:   . Emotionally Abused:   Marland Kitchen Physically Abused:   . Sexually Abused:  Past Surgical History:  Procedure Laterality Date  . EPIDURAL BLOCK INJECTION    . NECK SURGERY      Family History  Problem Relation Age of Onset  . Heart attack Mother   . Mental illness Mother   . Bone cancer Brother   . Heart attack Brother     Allergies  Allergen Reactions  . Leflunomide Other (See Comments)    GI upset GI upset   . Methotrexate Other (See Comments)    GI upset GI upset     Current Outpatient Medications on File Prior to Visit  Medication Sig Dispense Refill  . Adalimumab 40 MG/0.8ML PNKT Inject 0.8 mLs into the skin every 14 (fourteen) days.    Marland Kitchen  alendronate (FOSAMAX) 70 MG tablet Take 70 mg by mouth once a week.    . Aspirin-Caffeine (BAYER BACK & BODY) 500-32.5 MG TABS Take 1 tablet by mouth 2 (two) times daily.    Marland Kitchen etodolac (LODINE) 400 MG tablet Take 400 mg by mouth 2 (two) times daily as needed.    . gabapentin (NEURONTIN) 300 MG capsule Take 1 capsule (300 mg total) by mouth 3 (three) times daily. 90 capsule 5  . lidocaine (LIDODERM) 5 % Place onto the skin.    . predniSONE (DELTASONE) 5 MG tablet Take 2.5 mg by mouth daily.    Marland Kitchen tiZANidine (ZANAFLEX) 4 MG tablet Take 4 mg by mouth 3 (three) times daily as needed. Taking 1 at night    . traMADol (ULTRAM) 50 MG tablet Take 50 mg by mouth 2 (two) times daily as needed. States she is not taking     No current facility-administered medications on file prior to visit.    BP 134/82   Pulse 88   Temp (!) 97 F (36.1 C) (Temporal)   Ht 5\' 4"  (1.626 m)   Wt 152 lb (68.9 kg)   SpO2 98%   BMI 26.09 kg/m    Objective:   Physical Exam  Constitutional: She appears well-nourished.  Cardiovascular: Normal rate and regular rhythm.  Respiratory: Effort normal and breath sounds normal.  Musculoskeletal:     Cervical back: Neck supple.     Left knee: Swelling present. No deformity or erythema. Normal range of motion. No tenderness.       Legs:  Skin: Skin is warm and dry.  Psychiatric: She has a normal mood and affect.           Assessment & Plan:

## 2020-03-09 NOTE — Assessment & Plan Note (Signed)
Following with pain management, may undergo surgical intervention within the next 12 months. Continue current regimen. 

## 2020-03-09 NOTE — Assessment & Plan Note (Signed)
Acute on chronic pain to left knee since fall last night. Exam today with mild swelling, ambulatory in clinic today and appears stable.  Given history of osteoporosis coupled with trauma, will check plain films today.  Continue etodolac evert 6-8 hours PRN. Ice, rest, elevation.

## 2020-03-09 NOTE — Assessment & Plan Note (Signed)
Following with pain management, may undergo surgical intervention within the next 12 months. Continue current regimen.

## 2020-03-11 ENCOUNTER — Telehealth: Payer: Self-pay | Admitting: Primary Care

## 2020-03-11 DIAGNOSIS — J984 Other disorders of lung: Secondary | ICD-10-CM

## 2020-03-11 NOTE — Telephone Encounter (Signed)
Spoken to patient and ask if she had any concerns. I have repeated Kate's comments and patient is agreeable to see pulmonology.

## 2020-03-11 NOTE — Telephone Encounter (Signed)
Noted, referral placed.  

## 2020-03-11 NOTE — Telephone Encounter (Signed)
Requesting a call to discuss results.

## 2020-03-13 DIAGNOSIS — M25562 Pain in left knee: Secondary | ICD-10-CM | POA: Diagnosis not present

## 2020-03-18 ENCOUNTER — Telehealth: Payer: Self-pay | Admitting: *Deleted

## 2020-03-18 NOTE — Telephone Encounter (Signed)
Attempted to reach patient, no voicemail.

## 2020-03-19 ENCOUNTER — Encounter: Payer: Self-pay | Admitting: Student in an Organized Health Care Education/Training Program

## 2020-03-19 ENCOUNTER — Other Ambulatory Visit: Payer: Self-pay

## 2020-03-19 ENCOUNTER — Ambulatory Visit
Payer: Medicare HMO | Attending: Student in an Organized Health Care Education/Training Program | Admitting: Student in an Organized Health Care Education/Training Program

## 2020-03-19 DIAGNOSIS — M5416 Radiculopathy, lumbar region: Secondary | ICD-10-CM | POA: Diagnosis not present

## 2020-03-19 DIAGNOSIS — M47816 Spondylosis without myelopathy or radiculopathy, lumbar region: Secondary | ICD-10-CM | POA: Diagnosis not present

## 2020-03-19 DIAGNOSIS — G8929 Other chronic pain: Secondary | ICD-10-CM

## 2020-03-19 DIAGNOSIS — M5136 Other intervertebral disc degeneration, lumbar region: Secondary | ICD-10-CM

## 2020-03-19 DIAGNOSIS — M48062 Spinal stenosis, lumbar region with neurogenic claudication: Secondary | ICD-10-CM

## 2020-03-19 DIAGNOSIS — M51369 Other intervertebral disc degeneration, lumbar region without mention of lumbar back pain or lower extremity pain: Secondary | ICD-10-CM

## 2020-03-19 MED ORDER — PREGABALIN 25 MG PO CAPS: ORAL_CAPSULE | ORAL | 0 refills | Status: DC

## 2020-03-19 NOTE — Progress Notes (Signed)
Patient: Casey [name]  Service Category: E/M  Provider: Gillis Santa, MD  DOB: November 20, 1946  DOS: 03/19/2020  Location: Office  MRN: [id number]  Setting: Ambulatory outpatient  Referring Provider: Pleas Koch, NP  Type: Established Patient  Specialty: Interventional Pain Management  PCP: Pleas Koch, NP  Location: Home  Delivery: TeleHealth     Virtual Encounter - Pain Management PROVIDER NOTE: Information contained herein reflects review and annotations entered in association with encounter. Interpretation of such information and data should be left to medically-trained personnel. Information provided to patient can be located elsewhere in the medical record under "Patient Instructions". Document created using STT-dictation technology, any transcriptional errors that may result from process are unintentional.    Contact & Pharmacy Preferred: [phone] Home: [phone] (home) Mobile: [phone] (mobile) E-mail: [name]  [id number] [place]Phone: [phone]: [phone] [id number] [place], [place]: [phone]: [phone] Pre-screening  Casey [name] offered "in-person" vs "virtual" encounter. She indicated preferring virtual for this encounter.   Reason COVID-19*  Social distancing based on CDC and AMA recommendations.   I contacted Casey Fabiann 03/19/2020 via telephone.      I clearly identified myself as BiGillis SantaMD. I verified that I was speaking with the correct person using two identifiers (Name: Casey KAMAand date of birth: 07/1946-06-26  This visit was completed via telephone due to the restrictions of the COVID-19 pandemic. All issues as above were discussed and addressed but no physical exam was performed. If it was felt that the patient should be evaluated in the office, they  were directed there. The patient verbally consented to this visit. Patient was unable to complete an audio/visual visit due to Technical difficulties and/or Lack of internet. Due to the catastrophic nature of the COVID-19 pandemic, this visit was done through audio contact only.  Location of the patient: home address (see Epic for details)  Location of the provider: office  Consent I sought verbal advanced consent from Casey [name] virtual visit interactions. I informed Casey [name] of possible security and privacy concerns, risks, and limitations associated with providing "not-in-person" medical evaluation and management services. I also informed Casey [name] of the availability of "in-person" appointments. Finally, I informed her that there would be a charge for the virtual visit and that she could be  personally, fully or partially, financially responsible for it. Ms. [name] understanding and agreed to proceed.   Historic Elements   [name] a [age].o. year old, female patient evaluated today after her last contact with our practice on 03/18/2020. Ms. EvTilmonhas a past medical history of Chronic back pain, Osteoporosis, and Rheumatoid arthritis [place] She also  has a past surgical history that includes Epidural block injection and Neck surgery. Ms. [name] a current medication list which includes the following prescription(s): adalimumab, alendronate, etodolac, lidocaine, prednisone, teriparatide (recombinant), tizanidine, bayer back & body, pregabalin, and tramadol. She  reports that she has never smoked. She has never used smokeless tobacco. She reports that she does not drink alcohol or use drugs. [name] allergic to leflunomide and methotrexate.   HPI  Today, she is being contacted for medication management.  Having increased left knee pain with weightbearing.  She status post left knee steroid injection with Dr. KeJefm Bryantn 03/13/2020.  She states that the  gabapentin dose at 300 mg 3 times daily is only providing her with pain relief for approximately 2 hours and she is also noticing blurry vision.  I will have her discontinue due to side effects and will start Lyrica as below.  Have also offered the patient a lumbar epidural steroid injection at L2-L3 with minimal steroid given her daily prednisone use and osteoporosis.  For her left knee pain, we also discussed genicular nerve block and possible radiofrequency ablation without any steroid.  Laboratory Chemistry Profile   Renal No results found for: BUN, CREATININE, LABCREA, BCR, GFR, GFRAA, GFRNONAA, LABVMA, EPIRU, EPINEPH24HUR, NOREPRU, NOREPI24HUR, DOPARU, [id number]   Hepatic No results found for: AST, ALT, ALBUMIN, ALKPHOS, HCVAB, AMYLASE, LIPASE, AMMONIA   Electrolytes No results found for: NA, K, CL, CALCIUM, MG, PHOS   Bone No results found for: VD25OH, [id number], [id number], [id number], 25OHVITD1, 25OHVITD2, 25OHVITD3, TESTOFREE, TESTOSTERONE   Inflammation (CRP: Acute Phase) (ESR: Chronic Phase) No results found for: CRP, ESRSEDRATE, LATICACIDVEN     Note: Above Lab results reviewed.  Imaging  DG Knee Complete 4 Views Left CLINICAL DATA:  [place] [address] last evening and injured left knee.  EXAM: LEFT KNEE - COMPLETE 4+ VIEW  COMPARISON:  None.  FINDINGS: Significant bilateral tricompartmental degenerative changes with joint space narrowing and spurring. No acute fracture is identified. No osteochondral lesion. No chondrocalcinosis. There is a small joint effusion noted.  IMPRESSION: Significant tricompartmental degenerative changes but no acute fracture.  Small joint effusion.  Electronically Signed   By: Marijo Sanes M.D.   On: 03/09/2020 16:55 DG Chest 2 View CLINICAL DATA:  Apical scarring.  EXAM: CHEST - 2 VIEW  COMPARISON:  Neck CT 12/17/2019  FINDINGS: The cardiac silhouette, mediastinal and hilar contours are within normal limits.  Mild biapical pleural  and parenchymal scarring type changes but no worrisome pulmonary lesions. No acute pulmonary findings. No pleural effusion. The bony thorax is intact.  IMPRESSION: Chronic biapical pleural and parenchymal scarring changes but no acute pulmonary findings.  Electronically Signed   By: Marijo Sanes M.D.   On: 03/09/2020 16:53  Assessment  The primary encounter diagnosis was Lumbar radiculopathy. Diagnoses of Chronic radicular lumbar pain, Lumbar facet arthropathy, Spinal stenosis, lumbar region, with neurogenic claudication, and Lumbar degenerative disc disease were also pertinent to this visit.  Plan of Care   Ms. SHEREEN MARTON has a current medication list which includes the following long-term medication(s): adalimumab and pregabalin.  Discontinue gabapentin.  Pharmacotherapy (Medications Ordered): Meds ordered this encounter  Medications  . pregabalin (LYRICA) 25 MG capsule    Sig: Take 1 capsule (25 mg total) by mouth 2 (two) times daily for 30 days, THEN 2 capsules (50 mg total) 2 (two) times daily.    Dispense:  180 capsule    Refill:  0    Follow-up plan:   Return in about 6 weeks (around 04/30/2020) for Medication Management, virtual.      Recent Visits Date Type Provider Dept  02/26/20 Office Visit Gillis Santa, MD [facility]  Showing recent visits within past 90 days and meeting all other requirements   Today's Visits Date Type Provider Dept  03/19/20 Telemedicine Gillis Santa, MD [facility]  Showing today's visits and meeting all other requirements   Future Appointments No visits were found meeting these conditions.  Showing future appointments within next 90 days and meeting all other requirements   I discussed the assessment and treatment plan with the patient.  The patient was provided an opportunity to ask questions and all were answered. The patient agreed with the plan and demonstrated an understanding of the  instructions.  Patient advised to call back or seek an in-person evaluation if the symptoms or condition worsens.  Duration of encounter: 25 minutes.  Note by: Gillis Santa, MD Date: 03/19/2020; Time: 10:25 AM

## 2020-03-24 DIAGNOSIS — R69 Illness, unspecified: Secondary | ICD-10-CM | POA: Diagnosis not present

## 2020-03-24 DIAGNOSIS — Z743 Need for continuous supervision: Secondary | ICD-10-CM | POA: Diagnosis not present

## 2020-03-24 DIAGNOSIS — M5116 Intervertebral disc disorders with radiculopathy, lumbar region: Secondary | ICD-10-CM | POA: Diagnosis not present

## 2020-03-26 ENCOUNTER — Telehealth: Payer: Self-pay | Admitting: Student in an Organized Health Care Education/Training Program

## 2020-03-26 NOTE — Telephone Encounter (Signed)
Patient asking if she should take the Pregabalin that was prescribed on 03-19-20. I advised her to do so.

## 2020-03-26 NOTE — Telephone Encounter (Signed)
Patient lvmail at 12:15 asking to speak with someone about the new medication Dr. Cherylann Ratel has started her on. Said she would appreciate a call asap

## 2020-04-02 ENCOUNTER — Ambulatory Visit
Payer: Medicare HMO | Attending: Student in an Organized Health Care Education/Training Program | Admitting: Student in an Organized Health Care Education/Training Program

## 2020-04-02 ENCOUNTER — Telehealth: Payer: Self-pay

## 2020-04-02 ENCOUNTER — Other Ambulatory Visit: Payer: Self-pay

## 2020-04-02 ENCOUNTER — Encounter: Payer: Self-pay | Admitting: Student in an Organized Health Care Education/Training Program

## 2020-04-02 DIAGNOSIS — G8929 Other chronic pain: Secondary | ICD-10-CM

## 2020-04-02 DIAGNOSIS — M47816 Spondylosis without myelopathy or radiculopathy, lumbar region: Secondary | ICD-10-CM | POA: Diagnosis not present

## 2020-04-02 DIAGNOSIS — M5416 Radiculopathy, lumbar region: Secondary | ICD-10-CM | POA: Diagnosis not present

## 2020-04-02 DIAGNOSIS — M1711 Unilateral primary osteoarthritis, right knee: Secondary | ICD-10-CM

## 2020-04-02 DIAGNOSIS — G894 Chronic pain syndrome: Secondary | ICD-10-CM | POA: Diagnosis not present

## 2020-04-02 DIAGNOSIS — M5136 Other intervertebral disc degeneration, lumbar region: Secondary | ICD-10-CM

## 2020-04-02 DIAGNOSIS — M48062 Spinal stenosis, lumbar region with neurogenic claudication: Secondary | ICD-10-CM | POA: Diagnosis not present

## 2020-04-02 DIAGNOSIS — M170 Bilateral primary osteoarthritis of knee: Secondary | ICD-10-CM

## 2020-04-02 MED ORDER — TIZANIDINE HCL 4 MG PO TABS: 4.0000 mg | ORAL_TABLET | Freq: Three times a day (TID) | ORAL | 2 refills | Status: DC | PRN

## 2020-04-02 MED ORDER — DULOXETINE HCL 20 MG PO CPEP: ORAL_CAPSULE | ORAL | 0 refills | Status: DC

## 2020-04-02 NOTE — Telephone Encounter (Signed)
She says her legs feel like they are crumbling and her hands and feet are swollen. She wants to know if there is anything that can be done.

## 2020-04-02 NOTE — Telephone Encounter (Signed)
Patient is scheduled for 3:15 today 

## 2020-04-02 NOTE — Telephone Encounter (Signed)
Please schedule appt

## 2020-04-02 NOTE — Progress Notes (Signed)
Patient: Casey [name]  Service Category: E/M  Provider: Gillis Santa, MD  DOB: [birth year]  DOS: [name]  Location: Office  MRN: [id number]  Setting: Ambulatory outpatient  Referring Provider: Pleas Koch, NP  Type: Established Patient  Specialty: Interventional Pain Management  PCP: Pleas Koch, NP  Location: Home  Delivery: TeleHealth     Virtual Encounter - Pain Management PROVIDER NOTE: Information contained herein reflects review and annotations entered in association with encounter. Interpretation of such information and data should be left to medically-trained personnel. Information provided to patient can be located elsewhere in the medical record under "Patient Instructions". Document created using STT-dictation technology, any transcriptional errors that may result from process are unintentional.    Contact & Pharmacy Preferred: [phone] Home: [phone] (home) Mobile: [phone] (mobile) E-mail: [email or web address]  [id number] [place]Phone: [phone]: [phone] [id number] [place], [place]: [phone]: [phone] Pre-screening  Casey [name] offered "in-person" vs "virtual" encounter. She indicated preferring virtual for this encounter.   Reason COVID-19*  Social distancing based on CDC and AMA recommendations.   I contacted Casey [name] via televisit.      I clearly identified myself as BiGillis SantaMD. I verified that I was speaking with the correct person using two identifiers (Name: Casey DEADWYLERand date of birth: 07/1946/01/10  Consent I sought verbal advanced consent from Casey [name] virtual visit interactions. I informed Casey [name] of possible security and privacy concerns, risks, and limitations associated with providing "not-in-person" medical evaluation and  management services. I also informed Casey [name] of the availability of "in-person" appointments. Finally, I informed her that there would be a charge for the virtual visit and that she could be  personally, fully or partially, financially responsible for it. Casey [name] understanding and agreed to proceed.   Historic Elements   Casey [name] a 89.o. year old, female patient evaluated today after her last contact with our practice on [name]. Casey [name] a past medical history of Chronic back pain, Osteoporosis, and Rheumatoid arthritis ([place] She also  has a past surgical history that includes Epidural block injection and Neck surgery. Casey [name] a current medication list which includes the following prescription(s): adalimumab, alendronate, etodolac, lidocaine, prednisone, teriparatide (recombinant), bayer back & body, duloxetine, tizanidine, and tramadol. She  reports that she has never smoked. She has never used smokeless tobacco. She reports that she does not drink alcohol or use drugs. Casey [name] allergic to leflunomide and methotrexate.   HPI  Today, she is being contacted for medication management.  Patient follows up today for medication management.  At her previous visit, gabapentin was not effective and she was started on Lyrica 25 mg twice daily.  She states that the Lyrica is causing her to swell.  She endorses swelling in her legs, bilateral feet, buttock region.  I encouraged her to discontinue this medication.  She has not tried Cymbalta in the past.  Risks and benefits of this medication were reviewed and we will trial as below.  She continues tizanidine as prescribed and she states that it does help with her lumbar paraspinal muscle spasms.  We will refill as below.  In regards to her right knee pain status post right knee arthroscopy related to knee osteoarthritis, discussed  diagnostic right knee genicular nerve block.  Risk and benefits reviewed.  As needed order  placed.   Laboratory Chemistry Profile   Renal No results found for: BUN, CREATININE, LABCREA, BCR, GFR, GFRAA, GFRNONAA, LABVMA, EPIRU, EPINEPH24HUR, NOREPRU, NOREPI24HUR, DOPARU, [id number]   Hepatic No results found for: AST, ALT, ALBUMIN, ALKPHOS, HCVAB, AMYLASE, LIPASE, AMMONIA   Electrolytes No results found for: NA, K, CL, CALCIUM, MG, PHOS   Bone No results found for: VD25OH, [id number], [id number], [id number], 25OHVITD1, 25OHVITD2, 25OHVITD3, TESTOFREE, TESTOSTERONE   Inflammation (CRP: Acute Phase) (ESR: Chronic Phase) No results found for: CRP, ESRSEDRATE, LATICACIDVEN     Note: Above Lab results reviewed.  Imaging  DG Knee Complete 4 Views Left CLINICAL DATA:  [place] last evening and injured left knee.  EXAM: LEFT KNEE - COMPLETE 4+ VIEW  COMPARISON:  None.  FINDINGS: Significant bilateral tricompartmental degenerative changes with joint space narrowing and spurring. No acute fracture is identified. No osteochondral lesion. No chondrocalcinosis. There is a small joint effusion noted.  IMPRESSION: Significant tricompartmental degenerative changes but no acute fracture.  Small joint effusion.  Electronically Signed   By: Marijo Sanes M.D.   On: 03/09/2020 16:55 DG Chest 2 View CLINICAL DATA:  Apical scarring.  EXAM: CHEST - 2 VIEW  COMPARISON:  Neck CT 12/17/2019  FINDINGS: The cardiac silhouette, mediastinal and hilar contours are within normal limits.  Mild biapical pleural and parenchymal scarring type changes but no worrisome pulmonary lesions. No acute pulmonary findings. No pleural effusion. The bony thorax is intact.  IMPRESSION: Chronic biapical pleural and parenchymal scarring changes but no acute pulmonary findings.  Electronically Signed   By: Marijo Sanes M.D.   On: 03/09/2020 16:53  Assessment  The primary encounter diagnosis was Lumbar radiculopathy. Diagnoses of Chronic radicular lumbar pain, Lumbar facet arthropathy,  Spinal stenosis, lumbar region, with neurogenic claudication, Lumbar degenerative disc disease, Primary osteoarthritis of right knee, Chronic pain syndrome, and Bilateral primary osteoarthritis of knee were also pertinent to this visit.  Plan of Care   Casey [name] has a current medication list which includes the following long-term medication(s): adalimumab and duloxetine.  Pharmacotherapy (Medications Ordered): Meds ordered this encounter  Medications  . DULoxetine (CYMBALTA) 20 MG capsule    Sig: Take 1 capsule (20 mg total) by mouth daily for 30 days, THEN 2 capsules (40 mg total) daily.    Dispense:  90 capsule    Refill:  0  . tiZANidine (ZANAFLEX) 4 MG tablet    Sig: Take 1 tablet (4 mg total) by mouth 3 (three) times daily as needed. Taking 1 at night    Dispense:  90 tablet    Refill:  2   Orders:  Orders Placed This Encounter  Procedures  . GENICULAR NERVE BLOCK    For knee pain.    Standing Status:   Standing    Number of Occurrences:   1    Standing Expiration Date:   04/02/2021    Scheduling Instructions:     Side(s): Bilateral Knee     Level(s): Superior-Lateral, Superior-Medial, and Inferior-Medial Genicular Nerves     Sedation: With Sedation.     TIMEFRAME: PRN procedure. (Ms. Pung will call when needed.)    Order Specific Question:   Where will this procedure be performed?    Answer:   [facility]   Follow-up plan:   Return in about 8 weeks (around 05/28/2020) for Medication Management, in person.    Recent Visits Date Type Provider  Dept  03/19/20 Telemedicine Casey Santa, MD [facility]  02/26/20 Office Visit Casey Santa, MD [facility]  Showing recent visits within past 90 days and meeting all other requirements   Today's Visits Date Type Provider Dept  04/02/20 Telemedicine Casey Santa, MD [facility]  Showing today's visits and meeting all other requirements   Future Appointments Date Type Provider  Dept  04/30/20 Appointment Casey Santa, MD [facility]  Showing future appointments within next 90 days and meeting all other requirements   I discussed the assessment and treatment plan with the patient. The patient was provided an opportunity to ask questions and all were answered. The patient agreed with the plan and demonstrated an understanding of the instructions.  Patient advised to call back or seek an in-person evaluation if the symptoms or condition worsens.  Duration of encounter: 25 minutes.  Note by: Casey Santa, MD Date: [name]; Time: 3:45 PM

## 2020-04-03 ENCOUNTER — Telehealth: Payer: Self-pay | Admitting: *Deleted

## 2020-04-03 NOTE — Telephone Encounter (Signed)
Message left by patient on yesterday afternoon stating that the medication prescribed for her was too expensive and she would not be able to afford it.  I tried to reach the patient back this morning, no answer and no voicemail.

## 2020-04-04 ENCOUNTER — Emergency Department: Payer: Medicare HMO

## 2020-04-04 ENCOUNTER — Other Ambulatory Visit: Payer: Self-pay

## 2020-04-04 ENCOUNTER — Encounter: Payer: Self-pay | Admitting: Emergency Medicine

## 2020-04-04 ENCOUNTER — Emergency Department
Admission: EM | Admit: 2020-04-04 | Discharge: 2020-04-04 | Disposition: A | Discharge status: Home / Self Care | Payer: Medicare HMO | Attending: Student in an Organized Health Care Education/Training Program | Admitting: Student in an Organized Health Care Education/Training Program

## 2020-04-04 DIAGNOSIS — R2242 Localized swelling, mass and lump, left lower limb: Secondary | ICD-10-CM | POA: Diagnosis present

## 2020-04-04 DIAGNOSIS — M7989 Other specified soft tissue disorders: Secondary | ICD-10-CM | POA: Diagnosis not present

## 2020-04-04 DIAGNOSIS — R600 Localized edema: Secondary | ICD-10-CM | POA: Insufficient documentation

## 2020-04-04 DIAGNOSIS — R609 Edema, unspecified: Secondary | ICD-10-CM

## 2020-04-04 DIAGNOSIS — Z79899 Other long term (current) drug therapy: Secondary | ICD-10-CM | POA: Insufficient documentation

## 2020-04-04 DIAGNOSIS — M79605 Pain in left leg: Secondary | ICD-10-CM | POA: Diagnosis not present

## 2020-04-04 LAB — URINALYSIS, COMPLETE (UACMP) WITH MICROSCOPIC
Bacteria, UA: NONE SEEN
Bilirubin Urine: NEGATIVE
Glucose, UA: NEGATIVE mg/dL
Ketones, ur: NEGATIVE mg/dL
Nitrite: NEGATIVE
Protein, ur: NEGATIVE mg/dL
RBC / HPF: >50 RBC/hpf — ABNORMAL HIGH (ref 0–5)
Specific Gravity, Urine: 1.011 (ref 1.005–1.030)
pH: 6 (ref 5.0–8.0)

## 2020-04-04 LAB — CBC WITH DIFFERENTIAL/PLATELET
Abs Immature Granulocytes: 0.02 10*3/uL (ref 0.00–0.07)
Basophils Absolute: 0 10*3/uL (ref 0.0–0.1)
Basophils Relative: 1 %
Eosinophils Absolute: 0.1 10*3/uL (ref 0.0–0.5)
Eosinophils Relative: 1 %
HCT: 41.9 % (ref 36.0–46.0)
Hemoglobin: 13.6 g/dL (ref 12.0–15.0)
Immature Granulocytes: 0 %
Lymphocytes Relative: 56 %
Lymphs Abs: 3.1 10*3/uL (ref 0.7–4.0)
MCH: 29.8 pg (ref 26.0–34.0)
MCHC: 32.5 g/dL (ref 30.0–36.0)
MCV: 91.9 fL (ref 80.0–100.0)
Monocytes Absolute: 0.4 10*3/uL (ref 0.1–1.0)
Monocytes Relative: 7 %
Neutro Abs: 2 10*3/uL (ref 1.7–7.7)
Neutrophils Relative %: 35 %
Platelets: 270 10*3/uL (ref 150–400)
RBC: 4.56 MIL/uL (ref 3.87–5.11)
RDW: 12 % (ref 11.5–15.5)
WBC: 5.6 10*3/uL (ref 4.0–10.5)
nRBC: 0 % (ref 0.0–0.2)

## 2020-04-04 LAB — COMPREHENSIVE METABOLIC PANEL
ALT: 13 U/L (ref 0–44)
AST: 25 U/L (ref 15–41)
Albumin: 4.4 g/dL (ref 3.5–5.0)
Alkaline Phosphatase: 93 U/L (ref 38–126)
Anion gap: 10 (ref 5–15)
BUN: 15 mg/dL (ref 8–23)
CO2: 26 mmol/L (ref 22–32)
Calcium: 9.7 mg/dL (ref 8.9–10.3)
Chloride: 105 mmol/L (ref 98–111)
Creatinine, Ser: 0.79 mg/dL (ref 0.44–1.00)
GFR calc Af Amer: >60 mL/min (ref >60)
GFR calc non Af Amer: >60 mL/min (ref >60)
Glucose, Bld: 125 mg/dL — ABNORMAL HIGH (ref 70–99)
Potassium: 4 mmol/L (ref 3.5–5.1)
Sodium: 141 mmol/L (ref 135–145)
Total Bilirubin: 1 mg/dL (ref 0.3–1.2)
Total Protein: 7.7 g/dL (ref 6.5–8.1)

## 2020-04-04 MED ORDER — MEDICAL COMPRESSION STOCKINGS MISC: 1.0000 | Freq: Every day | 1 refills | Status: DC

## 2020-04-04 NOTE — ED Triage Notes (Signed)
Pt to ED via POV c/o Left leg swelling. Pt states that this has been going on for the past few days. Pt went to urgent and was told to come to the ED for evaluation. Pt states that she feels like she is swollen all over. Pt denies pain but left leg does appear to be larger than right leg. Pt is in NAD.

## 2020-04-04 NOTE — ED Provider Notes (Signed)
[facility] Emergency Department Provider Note    First MD Initiated Contact with Patient 04/04/20 1619     (approximate)  I have reviewed the triage vital signs and the nursing notes.   HISTORY  Chief Complaint Leg Swelling    HPI Casey [name] is a 74 y.o. female plosive past medical history presents to the ER for evaluation of left leg swelling.  States been ongoing for several days.  Was recently in some prednisone.  Denies any worsening pain numbness or tingling.  Denies any chest pain or shortness of breath.  No nausea or vomiting.  No history of blood clots.    Past Medical History:  Diagnosis Date  . Chronic back pain   . Osteoporosis   . Rheumatoid arthritis (HCC)    Family History  Problem Relation Age of Onset  . Heart attack Mother   . Mental illness Mother   . Bone cancer Brother   . Heart attack Brother    Past Surgical History:  Procedure Laterality Date  . EPIDURAL BLOCK INJECTION    . NECK SURGERY     Patient Active Problem List   Diagnosis Date Noted  . Chronic radicular lumbar pain 10/31/2019  . Lumbar radiculopathy 10/31/2019  . Lumbar facet arthropathy 10/31/2019  . Lumbar spondylosis 10/31/2019  . Spinal stenosis, lumbar region, with neurogenic claudication 10/31/2019  . Lumbar degenerative disc disease 10/31/2019  . Bilateral primary osteoarthritis of knee 10/31/2019  . Sacroiliac joint pain 10/31/2019  . Nondisplaced fracture of right radial styloid process, initial encounter for closed fracture 08/06/2018  . Rheumatoid arthritis, unspecified (HCC) 07/29/2014  . Osteoporosis 07/29/2014      Prior to Admission medications   Medication Sig Start Date End Date Taking? Authorizing Provider  Adalimumab 40 MG/0.8ML PNKT Inject 0.8 mLs into the skin every 14 (fourteen) days. 07/27/18   [provider]  alendronate (FOSAMAX) 70 MG tablet Take 70 mg by mouth once a week. 02/04/14   [provider]    Aspirin-Caffeine (BAYER BACK & BODY) 500-32.5 MG TABS Take 1 tablet by mouth 2 (two) times daily.    [provider]  DULoxetine (CYMBALTA) 20 MG capsule Take 1 capsule (20 mg total) by mouth daily for 30 days, THEN 2 capsules (40 mg total) daily. 04/02/20 06/01/20  Edward Jolly, MD  Elastic Bandages & Supports (MEDICAL COMPRESSION STOCKINGS) MISC 1 each by Does not apply route daily. 04/04/20   Willy Eddy, MD  etodolac (LODINE) 400 MG tablet Take 400 mg by mouth 2 (two) times daily as needed. 01/25/10   [provider]  lidocaine (LIDODERM) 5 % Place onto the skin. 07/26/19   [provider]  predniSONE (DELTASONE) 5 MG tablet Take 2.5 mg by mouth daily. 05/16/19   [provider]  Teriparatide, Recombinant, (FORTEO [place]) Inject 20 mcg into the skin daily.    [provider]  tiZANidine (ZANAFLEX) 4 MG tablet Take 1 tablet (4 mg total) by mouth 3 (three) times daily as needed. Taking 1 at night 04/02/20   Edward Jolly, MD  traMADol (ULTRAM) 50 MG tablet Take 50 mg by mouth 2 (two) times daily as needed. States she is not taking 07/08/19   [provider]    Allergies Leflunomide and Methotrexate    Social History Social History   Tobacco Use  . Smoking status: Never Smoker  . Smokeless tobacco: Never Used  Substance Use Topics  . Alcohol use: Never  . Drug use:  Never    Review of Systems Patient denies headaches, rhinorrhea, blurry vision, numbness, shortness of breath, chest pain, edema, cough, abdominal pain, nausea, vomiting, diarrhea, dysuria, fevers, rashes or hallucinations unless otherwise stated above in HPI. ____________________________________________   PHYSICAL EXAM:  VITAL SIGNS: Vitals:   04/04/20 1504 04/04/20 1508  BP:  (!) 178/104  Pulse: 90   Resp: 16   Temp: 98.4 F (36.9 C)   SpO2: 98%     Constitutional: Alert and oriented.  Eyes: Conjunctivae are normal.  Head: Atraumatic. Nose: No  congestion/rhinnorhea. Mouth/Throat: Mucous membranes are moist.   Neck: No stridor. Painless ROM.  Cardiovascular: Normal rate, regular rhythm. Grossly normal heart sounds.  Good peripheral circulation. Respiratory: Normal respiratory effort.  No retractions. Lungs CTAB. Gastrointestinal: Soft and nontender. No distention. No abdominal bruits. No CVA tenderness. Genitourinary:  Musculoskeletal: No lower extremity tenderness trace bilateral L>R edema.  No joint effusions. Neurologic:  Normal speech and language. No gross focal neurologic deficits are appreciated. No facial droop Skin:  Skin is warm, dry and intact. No rash noted. Psychiatric: Mood and affect are normal. Speech and behavior are normal.  ____________________________________________   LABS (all labs ordered are listed, but only abnormal results are displayed)  Results for orders placed or performed during the hospital encounter of 04/04/20 (from the past 24 hour(s))  CBC with Differential/Platelet     Status: None   Collection Time: 04/04/20  4:50 PM  Result Value Ref Range   WBC 5.6 4.0 - 10.5 K/uL   RBC 4.56 3.87 - 5.11 MIL/uL   Hemoglobin 13.6 12.0 - 15.0 g/dL   HCT 41.9 36.0 - 46.0 %   MCV 91.9 80.0 - 100.0 fL   MCH 29.8 26.0 - 34.0 pg   MCHC 32.5 30.0 - 36.0 g/dL   RDW 12.0 11.5 - 15.5 %   Platelets 270 150 - 400 K/uL   nRBC 0.0 0.0 - 0.2 %   Neutrophils Relative % 35 %   Neutro Abs 2.0 1.7 - 7.7 K/uL   Lymphocytes Relative 56 %   Lymphs Abs 3.1 0.7 - 4.0 K/uL   Monocytes Relative 7 %   Monocytes Absolute 0.4 0.1 - 1.0 K/uL   Eosinophils Relative 1 %   Eosinophils Absolute 0.1 0.0 - 0.5 K/uL   Basophils Relative 1 %   Basophils Absolute 0.0 0.0 - 0.1 K/uL   Immature Granulocytes 0 %   Abs Immature Granulocytes 0.02 0.00 - 0.07 K/uL  Comprehensive metabolic panel     Status: Abnormal   Collection Time: 04/04/20  4:50 PM  Result Value Ref Range   Sodium 141 135 - 145 mmol/L   Potassium 4.0 3.5 - 5.1  mmol/L   Chloride 105 98 - 111 mmol/L   CO2 26 22 - 32 mmol/L   Glucose, Bld 125 (H) 70 - 99 mg/dL   BUN 15 8 - 23 mg/dL   Creatinine, Ser 0.79 0.44 - 1.00 mg/dL   Calcium 9.7 8.9 - 10.3 mg/dL   Total Protein 7.7 6.5 - 8.1 g/dL   Albumin 4.4 3.5 - 5.0 g/dL   AST 25 15 - 41 U/L   ALT 13 0 - 44 U/L   Alkaline Phosphatase 93 38 - 126 U/L   Total Bilirubin 1.0 0.3 - 1.2 mg/dL   GFR calc non Af Amer >60 >60 mL/min   GFR calc Af Amer >60 >60 mL/min   Anion gap 10 5 - 15  Urinalysis, Complete w Microscopic  Status: Abnormal   Collection Time: 04/04/20  4:50 PM  Result Value Ref Range   Color, Urine YELLOW (A) YELLOW   APPearance HAZY (A) CLEAR   Specific Gravity, Urine 1.011 1.005 - 1.030   pH 6.0 5.0 - 8.0   Glucose, UA NEGATIVE NEGATIVE mg/dL   Hgb urine dipstick LARGE (A) NEGATIVE   Bilirubin Urine NEGATIVE NEGATIVE   Ketones, ur NEGATIVE NEGATIVE mg/dL   Protein, ur NEGATIVE NEGATIVE mg/dL   Nitrite NEGATIVE NEGATIVE   Leukocytes,Ua SMALL (A) NEGATIVE   RBC / HPF >50 (H) 0 - 5 RBC/hpf   WBC, UA 6-10 0 - 5 WBC/hpf   Bacteria, UA NONE SEEN NONE SEEN   Squamous Epithelial / LPF 0-5 0 - 5   Mucus PRESENT    ____________________________________________ ______________________________  RADIOLOGY  I personally reviewed all radiographic images ordered to evaluate for the above acute complaints and reviewed radiology reports and findings.  These findings were personally discussed with the patient.  Please see medical record for radiology report.  ____________________________________________   PROCEDURES  Procedure(s) performed:  Procedures    Critical Care performed: no ____________________________________________   INITIAL IMPRESSION / ASSESSMENT AND PLAN / ED COURSE  Pertinent labs & imaging results that were available during my care of the patient were reviewed by me and considered in my medical decision making (see chart for details).   DDX: DVT, lymphedema,  CHF, medication effect  Casey [name] is a 74 y.o. who presents to the ED with symptoms as described above.  Patient nontoxic-appearing able to ambulate with steady gait.  No evidence of DVT.  Will order blood work.  Will order chest x-ray.  Have a low suspicion for congestive heart failure.  Clinical Course as of Apr 04 1846  Sat Apr 04, 2020  1741 Blood work is reassuring.  Patient denies any dysuria or urinary frequency.  No evidence of lower extremity ischemia.  Neurovascular intact.  No evidence of DVT.  Do believe she stable and appropriate for outpatient follow-up.   [PR]    Clinical Course User Index [PR] Willy Eddy, MD    The patient was evaluated in Emergency Department today for the symptoms described in the history of present illness. He/she was evaluated in the context of the global COVID-19 pandemic, which necessitated consideration that the patient might be at risk for infection with the SARS-CoV-2 virus that causes COVID-19. Institutional protocols and algorithms that pertain to the evaluation of patients at risk for COVID-19 are in a state of rapid change based on information released by regulatory bodies including the CDC and federal and state organizations. These policies and algorithms were followed during the patient's care in the ED.  As part of my medical decision making, I reviewed the following data within the electronic MEDICAL RECORD NUMBER Nursing notes reviewed and incorporated, Labs reviewed, notes from prior ED visits and [place] Controlled Substance Database   ____________________________________________   FINAL CLINICAL IMPRESSION(S) / ED DIAGNOSES  Final diagnoses:  Peripheral edema      NEW MEDICATIONS STARTED DURING THIS VISIT:  New Prescriptions   ELASTIC BANDAGES & SUPPORTS (MEDICAL COMPRESSION STOCKINGS) MISC    1 each by Does not apply route daily.     Note:  This document was prepared using Dragon voice recognition software and may include  unintentional dictation errors.    Willy Eddy, MD 04/04/20 [place]

## 2020-04-04 NOTE — ED Notes (Signed)
ED Provider at bedside. 

## 2020-04-09 ENCOUNTER — Other Ambulatory Visit: Payer: Self-pay | Admitting: Student in an Organized Health Care Education/Training Program

## 2020-04-09 DIAGNOSIS — G894 Chronic pain syndrome: Secondary | ICD-10-CM

## 2020-04-09 DIAGNOSIS — G8929 Other chronic pain: Secondary | ICD-10-CM

## 2020-04-16 ENCOUNTER — Other Ambulatory Visit: Payer: Self-pay | Admitting: Student in an Organized Health Care Education/Training Program

## 2020-04-16 ENCOUNTER — Ambulatory Visit: Payer: Medicare HMO | Admitting: Primary Care

## 2020-04-16 DIAGNOSIS — M5416 Radiculopathy, lumbar region: Secondary | ICD-10-CM

## 2020-04-16 DIAGNOSIS — M4310 Spondylolisthesis, site unspecified: Secondary | ICD-10-CM | POA: Diagnosis not present

## 2020-04-16 NOTE — Progress Notes (Unsigned)
Order placed for epidural.

## 2020-04-17 ENCOUNTER — Telehealth: Payer: Self-pay | Admitting: Student in an Organized Health Care Education/Training Program

## 2020-04-17 ENCOUNTER — Ambulatory Visit: Payer: Medicare HMO | Admitting: Primary Care

## 2020-04-17 DIAGNOSIS — M5416 Radiculopathy, lumbar region: Secondary | ICD-10-CM

## 2020-04-17 NOTE — Telephone Encounter (Signed)
Patient states that she would like to stop the Cymbalta and restart the Lyrica.  States that the cymbalta is causing her not to sleep and making her nervous and jittery.  States she is not sure that she wants to have Epidural because she has had them in the past and they did not work.  She is out of Lyrica at this time.

## 2020-04-17 NOTE — Telephone Encounter (Signed)
Patient is having problems with Cymbalta, and wants to continue with pregabalin. Says she spoke with someone here yesterday. I cannot find msg. Please call patient to verify what is going on with her meds.  Thank you

## 2020-04-20 ENCOUNTER — Ambulatory Visit: Payer: Medicare HMO | Admitting: Primary Care

## 2020-04-20 DIAGNOSIS — R69 Illness, unspecified: Secondary | ICD-10-CM | POA: Diagnosis not present

## 2020-04-20 MED ORDER — PREGABALIN 25 MG PO CAPS: 25.0000 mg | ORAL_CAPSULE | Freq: Two times a day (BID) | ORAL | 2 refills | Status: DC

## 2020-04-20 NOTE — Telephone Encounter (Signed)
Attempted to call patient and inform her that Lyrica had been sent in by Dr Cherylann Ratel.  Voicemail has not been set up and could not leave a message.

## 2020-04-21 ENCOUNTER — Other Ambulatory Visit: Payer: Self-pay | Admitting: Neurosurgery

## 2020-04-21 ENCOUNTER — Encounter: Payer: Self-pay | Admitting: Student in an Organized Health Care Education/Training Program

## 2020-04-21 ENCOUNTER — Other Ambulatory Visit: Payer: Self-pay

## 2020-04-21 ENCOUNTER — Telehealth: Payer: Self-pay | Admitting: Student in an Organized Health Care Education/Training Program

## 2020-04-21 ENCOUNTER — Ambulatory Visit: Payer: Medicare HMO | Admitting: Primary Care

## 2020-04-21 ENCOUNTER — Ambulatory Visit
Payer: Medicare HMO | Attending: Student in an Organized Health Care Education/Training Program | Admitting: Student in an Organized Health Care Education/Training Program

## 2020-04-21 VITALS — BP 139/93 | HR 93 | Temp 97.5°F | Resp 16 | Ht 63.0 in | Wt 151.0 lb

## 2020-04-21 DIAGNOSIS — M47816 Spondylosis without myelopathy or radiculopathy, lumbar region: Secondary | ICD-10-CM

## 2020-04-21 DIAGNOSIS — M5416 Radiculopathy, lumbar region: Secondary | ICD-10-CM

## 2020-04-21 DIAGNOSIS — M4310 Spondylolisthesis, site unspecified: Secondary | ICD-10-CM

## 2020-04-21 DIAGNOSIS — G8929 Other chronic pain: Secondary | ICD-10-CM

## 2020-04-21 DIAGNOSIS — G894 Chronic pain syndrome: Secondary | ICD-10-CM | POA: Diagnosis not present

## 2020-04-21 DIAGNOSIS — Z0289 Encounter for other administrative examinations: Secondary | ICD-10-CM

## 2020-04-21 MED ORDER — PREGABALIN 50 MG PO CAPS: 50.0000 mg | ORAL_CAPSULE | Freq: Two times a day (BID) | ORAL | 1 refills | Status: DC

## 2020-04-21 MED ORDER — ORPHENADRINE CITRATE 30 MG/ML IJ SOLN
30.0000 mg | Freq: Once | INTRAMUSCULAR | Status: AC
  Administered 2020-04-21: 30 mg via INTRAMUSCULAR

## 2020-04-21 MED ORDER — METHYLPREDNISOLONE 4 MG PO TBPK: ORAL_TABLET | ORAL | 0 refills | Status: DC

## 2020-04-21 MED ORDER — KETOROLAC TROMETHAMINE 30 MG/ML IJ SOLN
INTRAMUSCULAR | Status: AC
  Filled 2020-04-21: qty 1

## 2020-04-21 MED ORDER — ORPHENADRINE CITRATE 30 MG/ML IJ SOLN
INTRAMUSCULAR | Status: AC
  Filled 2020-04-21: qty 2

## 2020-04-21 MED ORDER — KETOROLAC TROMETHAMINE 30 MG/ML IJ SOLN
30.0000 mg | Freq: Once | INTRAMUSCULAR | Status: AC
  Administered 2020-04-21: 30 mg via INTRAMUSCULAR

## 2020-04-21 NOTE — Telephone Encounter (Signed)
Called patient and she answered phone crying uncontrollable. Patient states she is in extreme pain and needs to be seen right now. Told patient if she is in that much pain she needs to go to the ED. Patient refuses to go to the ED, stating that they wouldn't do anything for her. Patient states she has been calling. Reviewed patients medication and instructed her that she has one to pick up.  Patient is expecting a call back for what she can do .   Pain is 10/10 in her back and will radiated up and down her legs bilaterally. Patient states she can sit, lay or anything. Casey [name] is cramping and having spasms. "I feel like I'm in a ball". She has an epidural scheduled June 2, but can not wait that long. Is there anything we can do to help her?

## 2020-04-21 NOTE — Telephone Encounter (Signed)
Patient states she is hurting so much, having severe cramps in her legs and swelling. She is almost out of medications. Very emotional and crying. States she needs some help today. She not sure she gonna make it without getting some help. Please call to assess and let Dr. Cherylann Ratel know.

## 2020-04-21 NOTE — Progress Notes (Signed)
Safety precautions to be maintained throughout the outpatient stay will include: orient to surroundings, keep bed in low position, maintain call bell within reach at all times, provide assistance with transfer out of bed and ambulation.  

## 2020-04-21 NOTE — Progress Notes (Signed)
PROVIDER NOTE: Information contained herein reflects review and annotations entered in association with encounter. Interpretation of such information and data should be left to medically-trained personnel. Information provided to patient can be located elsewhere in the medical record under "Patient Instructions". Document created using STT-dictation technology, any transcriptional errors that may result from process are unintentional.    Patient: Casey [name]  Service Category: E/M  Provider: Gillis Santa, MD  DOB: 1946-04-21  DOS: 04/21/2020  Referring Provider: Pleas Koch, NP  MRN: [id number]  Setting: Ambulatory outpatient  PCP: Pleas Koch, NP  Type: Established Patient  Specialty: Interventional Pain Management    Location: Office  Delivery: Face-to-face     Primary Reason(s) for Visit: Evaluation of chronic illnesses with exacerbation, or progression (Level of risk: moderate) CC: Back Pain (lower)  HPI  Casey [name] is a 74 y.o. year old, female patient, who comes today for a follow-up evaluation. She has Rheumatoid arthritis, unspecified ([place]); Osteoporosis; Nondisplaced fracture of right radial styloid process, initial encounter for closed fracture; Chronic radicular lumbar pain; Lumbar radiculopathy; Lumbar facet arthropathy; Lumbar spondylosis; Spinal stenosis, lumbar region, with neurogenic claudication; Lumbar degenerative disc disease; Bilateral primary osteoarthritis of knee; Sacroiliac joint pain; and Chronic pain syndrome on their problem list. Casey [name] was last seen on 04/21/2020. Her primarily concern today is the Back Pain (lower)  Pain Assessment: Location: Lower Back Radiating: buttocks down both legs bilaterally to feet Onset: In the past 7 days Duration: Chronic pain Quality: Constant, Aching, Crushing, Crying, Discomfort, Grimacing, Sore, Tiring Severity: 10-Worst pain ever/10 (subjective, self-reported pain score)  Note: Reported level is inconsistent with  clinical observations.                         When using our objective Pain Scale, levels between 6 and 10/10 are said to belong in an emergency room, as it progressively worsens from a 6/10, described as severely limiting, requiring emergency care not usually available at an outpatient pain management facility. At a 6/10 level, communication becomes difficult and requires great effort. Assistance to reach the emergency department may be required. Facial flushing and profuse sweating along with potentially dangerous increases in heart rate and blood pressure will be evident. Effect on ADL: "I cant do anything" Timing:   Modifying factors: sitting on pillow BP: (!) 139/93  HR: 93  Further details on both, my assessment(s), as well as the proposed treatment plan, please see below.  Presents with increased low back pain that is radiating down bilateral buttocks bilateral legs to feet.  Is scheduled for epidural injection next week.  States that she was in severe pain this morning.  Was very tearful.  Patient is a poor historian and is not very clear on medications that she should be taking.  She is currently on Cymbalta but does not find it beneficial and states that it causes her to be jittery.  We will have her transition back to Lyrica and increased dose to 50 mg twice daily.  Also plan for intramuscular Norflex and Toradol today.  Medrol dose pack as below   Laboratory Chemistry Profile   Renal Lab Results  Component Value Date   BUN 15 04/04/2020   CREATININE 0.79 04/04/2020   GFRAA >60 04/04/2020   GFRNONAA >60 04/04/2020   PROTEINUR NEGATIVE 04/04/2020     Electrolytes Lab Results  Component Value Date   NA 141 04/04/2020   K 4.0 04/04/2020   CL 105 04/04/2020  CALCIUM 9.7 04/04/2020     Hepatic Lab Results  Component Value Date   AST 25 04/04/2020   ALT 13 04/04/2020   ALBUMIN 4.4 04/04/2020   ALKPHOS 93 04/04/2020     ID No results found for: LYMEIGGIGMAB, HIV,  SARSCOV2NAA, STAPHAUREUS, MRSAPCR, HCVAB, PREGTESTUR, RMSFIGG, QFVRPH1IGG, QFVRPH2IGG, LYMEIGGIGMAB   Bone No results found for: VD25OH, OI325QD8YME, BR8309MM7, WK0881JS3, 25OHVITD1, 25OHVITD2, 25OHVITD3, TESTOFREE, TESTOSTERONE   Endocrine Lab Results  Component Value Date   GLUCOSE 125 (H) 04/04/2020   GLUCOSEU NEGATIVE 04/04/2020     Neuropathy No results found for: VITAMINB12, FOLATE, HGBA1C, HIV   CNS No results found for: COLORCSF, APPEARCSF, RBCCOUNTCSF, WBCCSF, POLYSCSF, LYMPHSCSF, EOSCSF, PROTEINCSF, GLUCCSF, JCVIRUS, CSFOLI, IGGCSF, LABACHR, ACETBL, LABACHR, ACETBL   Inflammation (CRP: Acute  ESR: Chronic) No results found for: CRP, ESRSEDRATE, LATICACIDVEN   Rheumatology No results found for: RF, ANA, LABURIC, URICUR, LYMEIGGIGMAB, LYMEABIGMQN, HLAB27   Coagulation Lab Results  Component Value Date   PLT 270 04/04/2020     Cardiovascular Lab Results  Component Value Date   HGB 13.6 04/04/2020   HCT 41.9 04/04/2020     Screening No results found for: SARSCOV2NAA, COVIDSOURCE, STAPHAUREUS, MRSAPCR, HCVAB, HIV, PREGTESTUR   Cancer No results found for: CEA, CA125, LABCA2   Allergens No results found for: ALMOND, APPLE, ASPARAGUS, AVOCADO, BANANA, BARLEY, BASIL, BAYLEAF, GREENBEAN, LIMABEAN, WHITEBEAN, BEEFIGE, REDBEET, BLUEBERRY, BROCCOLI, CABBAGE, MELON, CARROT, CASEIN, CASHEWNUT, CAULIFLOWER, CELERY     Note: Lab results reviewed.   Imaging Review   Lumbosacral Imaging: Lumbar MR wo contrast:  Results for orders placed during the hospital encounter of 07/02/16  MR Lumbar Spine Wo Contrast   Narrative CLINICAL DATA:  Low back pain radiating into the left buttock to the knee. Symptoms for 3-4 years but worsening. Now with occasional right hip pain. No known injury.  EXAM: MRI LUMBAR SPINE WITHOUT CONTRAST  TECHNIQUE: Multiplanar, multisequence MR imaging of the lumbar spine was performed. No intravenous contrast was administered.  COMPARISON:  MRI  lumbar spine 02/20/2010.  FINDINGS: Segmentation:  Unremarkable.  Alignment: 0.4 cm anterolisthesis L4 on L5 due to facet degenerative disease is new since the prior MRI.  Vertebrae: Height is maintained. Degenerative endplate signal change is noted at L2-3. No worrisome marrow lesion.  Conus medullaris: Extends to the L1 level and appears normal.  Paraspinal and other soft tissues: Unremarkable.  Disc levels:  T11-12 and T12-L1 are imaged in the sagittal plane only. The central canal and foramina are widely patent at each level with minimal disc bulging noted.  L1-2: Negative.  L2-3: Shallow disc bulge with a superimposed right lateral recess protrusion extending into the right foramen are identified. Narrowing in the right lateral recess is present with encroachment on the descending right L3 root. There is mild to moderate right foraminal narrowing. The left foramen is open. The appearance is unchanged.  L3-4: There is a shallow disc bulge somewhat more prominent to the left. There is some narrowing in the left lateral recess. The foramina are open. The appearance is unchanged.  L4-5: There has been progression of disease at this level. Left worse than right facet arthropathy is seen and there is a disc bulge. Ligamentum flavum thickening is also identified. Moderately severe central canal stenosis is seen with left worse than right lateral recess narrowing. The foramina are open.  L5-S1: Ligamentum flavum thickening and a shallow broad-based central protrusion without central canal stenosis. The foramina are open.  IMPRESSION: Since the prior examination spondylosis at L4-5  has markedly worsened. Increased facet degenerative change resulting in 0.4 cm anterolisthesis. Disc and ligamentum flavum thickening cause in moderately severe central canal and left worse than right lateral recess narrowing at this level.  No change in right lateral recess narrowing at L2-3  due to a small protrusion.  No change in mild left lateral recess narrowing L3-4 due to a disc bulge to the left.   Electronically Signed   By: Inge Rise M.D.   On: 07/02/2016 15:37     Knee-L DG 4 views:  Results for orders placed in visit on 03/09/20  DG Knee Complete 4 Views Left   Narrative CLINICAL DATA:  [place] last evening and injured left knee.  EXAM: LEFT KNEE - COMPLETE 4+ VIEW  COMPARISON:  None.  FINDINGS: Significant bilateral tricompartmental degenerative changes with joint space narrowing and spurring. No acute fracture is identified. No osteochondral lesion. No chondrocalcinosis. There is a small joint effusion noted.  IMPRESSION: Significant tricompartmental degenerative changes but no acute fracture.  Small joint effusion.   Electronically Signed   By: Marijo Sanes M.D.   On: 03/09/2020 16:55                             Meds   Current Outpatient Medications:  .  Adalimumab 40 MG/0.8ML PNKT, Inject 0.8 mLs into the skin every 14 (fourteen) days., Disp: , Rfl:  .  alendronate (FOSAMAX) 70 MG tablet, Take 70 mg by mouth once a week., Disp: , Rfl:  .  Aspirin-Caffeine (BAYER BACK & BODY) 500-32.5 MG TABS, Take 1 tablet by mouth 2 (two) times daily., Disp: , Rfl:  .  Elastic Bandages & Supports ([place]) MISC, 1 each by Does not apply route daily., Disp: 1 each, Rfl: 1 .  etodolac (LODINE) 400 MG tablet, Take 400 mg by mouth 2 (two) times daily as needed., Disp: , Rfl:  .  lidocaine (LIDODERM) 5 %, Place onto the skin., Disp: , Rfl:  .  predniSONE (DELTASONE) 5 MG tablet, Take 2.5 mg by mouth daily., Disp: , Rfl:  .  pregabalin (LYRICA) 25 MG capsule, Take 1 capsule (25 mg total) by mouth 2 (two) times daily., Disp: 60 capsule, Rfl: 2 .  Teriparatide, Recombinant, (FORTEO [place]), Inject 20 mcg into the skin daily., Disp: , Rfl:  .  tiZANidine (ZANAFLEX) 4 MG tablet, Take 1 tablet (4 mg total) by mouth 3 (three) times daily  as needed. Taking 1 at night, Disp: 90 tablet, Rfl: 2 .  traMADol (ULTRAM) 50 MG tablet, Take 50 mg by mouth 2 (two) times daily as needed. States she is not taking, Disp: , Rfl:  .  methylPREDNISolone (MEDROL) 4 MG TBPK tablet, Follow package instructions., Disp: 21 tablet, Rfl: 0 .  pregabalin (LYRICA) 50 MG capsule, Take 1 capsule (50 mg total) by mouth 2 (two) times daily., Disp: 60 capsule, Rfl: 1  ROS  Constitutional: Denies any fever or chills Gastrointestinal: No reported hemesis, hematochezia, vomiting, or acute GI distress Musculoskeletal: Denies any acute onset joint swelling, redness, loss of ROM, or weakness Neurological: No reported episodes of acute onset apraxia, aphasia, dysarthria, agnosia, amnesia, paralysis, loss of coordination, or loss of consciousness  Allergies  Casey [name] is allergic to leflunomide and methotrexate.  PFSH  Drug: Casey [name]  reports no history of drug use. Alcohol:  reports no history of alcohol use. Tobacco:  reports that she has never smoked. She has never  used smokeless tobacco. Medical:  has a past medical history of Allergy, Chronic back pain, Osteoporosis, and Rheumatoid arthritis ([place]). Surgical: Casey [name]  has a past surgical history that includes Epidural block injection and Neck surgery. Family: family history includes Bone cancer in her brother; Heart attack in her brother and mother; Mental illness in her mother.  Constitutional Exam  General appearance: Well nourished, well developed, and well hydrated. In no apparent acute distress Vitals:   04/21/20 1056  BP: (!) 139/93  Pulse: 93  Resp: 16  Temp: (!) 97.5 F (36.4 C)  SpO2: 100%  Weight: 151 lb (68.5 kg)  Height: 5' 3" (1.6 m)   BMI Assessment: Estimated body mass index is 26.75 kg/m as calculated from the following:   Height as of this encounter: 5' 3" (1.6 m).   Weight as of this encounter: 151 lb (68.5 kg).  BMI interpretation table: BMI level Category Range  association with higher incidence of chronic pain  <18 kg/m2 Underweight   18.5-24.9 kg/m2 Ideal body weight   25-29.9 kg/m2 Overweight Increased incidence by 20%  30-34.9 kg/m2 Obese (Class I) Increased incidence by 68%  35-39.9 kg/m2 Severe obesity (Class II) Increased incidence by 136%  >40 kg/m2 Extreme obesity (Class III) Increased incidence by 254%   Patient's current BMI Ideal Body weight  Body mass index is 26.75 kg/m. Ideal body weight: 52.4 kg (115 lb 8.3 oz) Adjusted ideal body weight: 58.8 kg (129 lb 11.4 oz)   BMI Readings from Last 4 Encounters:  04/21/20 26.75 kg/m  04/04/20 25.92 kg/m  03/09/20 26.09 kg/m  02/26/20 26.61 kg/m   Wt Readings from Last 4 Encounters:  04/21/20 151 lb (68.5 kg)  04/04/20 151 lb (68.5 kg)  03/09/20 152 lb (68.9 kg)  02/26/20 155 lb (70.3 kg)    Psych/Mental status: Alert, oriented x 3 (person, place, & time)       Eyes: PERLA Respiratory: No evidence of acute respiratory distress  Cervical Spine Exam  Skin & Axial Inspection: No masses, redness, edema, swelling, or associated skin lesions Alignment: Symmetrical Functional ROM: Unrestricted ROM      Stability: No instability detected Muscle Tone/Strength: Functionally intact. No obvious neuro-muscular anomalies detected. Sensory (Neurological): Unimpaired Palpation: No palpable anomalies              Thoracic Spine Area Exam  Skin & Axial Inspection: No masses, redness, or swelling Alignment: Symmetrical Functional ROM: Unrestricted ROM Stability: No instability detected Muscle Tone/Strength: Functionally intact. No obvious neuro-muscular anomalies detected. Sensory (Neurological): Unimpaired Muscle strength & Tone: No palpable anomalies  Lumbar Exam  Skin & Axial Inspection: No masses, redness, or swelling Alignment: Symmetrical Functional ROM: Pain restricted ROM affecting both sides Stability: No instability detected Muscle Tone/Strength: Functionally intact. No  obvious neuro-muscular anomalies detected. Sensory (Neurological): Dermatomal pain pattern Palpation: No palpable anomalies       Provocative Tests: Hyperextension/rotation test: Unable to perform due to pain. Lumbar quadrant test (Kemp's test): Unable to perform due to pain. Lateral bending test: (+) ipsilateral radicular pain, bilaterally. Positive for bilateral foraminal stenosis.  Gait & Posture Assessment  Ambulation: Limited Gait: Significantly limited. Dependent on assistive device to ambulate Posture: Difficulty standing up straight, due to pain   Lower Extremity Exam    Side: Right lower extremity  Side: Left lower extremity  Stability: No instability observed          Stability: No instability observed          Skin & Extremity  Inspection: Skin color, temperature, and hair growth are WNL. No peripheral edema or cyanosis. No masses, redness, swelling, asymmetry, or associated skin lesions. No contractures.  Skin & Extremity Inspection: Skin color, temperature, and hair growth are WNL. No peripheral edema or cyanosis. No masses, redness, swelling, asymmetry, or associated skin lesions. No contractures.  Functional ROM: Unrestricted ROM                  Functional ROM: Unrestricted ROM                  Muscle Tone/Strength: Functionally intact. No obvious neuro-muscular anomalies detected.  Muscle Tone/Strength: Functionally intact. No obvious neuro-muscular anomalies detected.  Sensory (Neurological): Neuropathic pain pattern        Sensory (Neurological): Neuropathic pain pattern        DTR: Patellar: deferred today Achilles: deferred today Plantar: deferred today  DTR: Patellar: deferred today Achilles: deferred today Plantar: deferred today  Palpation: No palpable anomalies  Palpation: No palpable anomalies   Assessment   Status Diagnosis  Persistent Persistent Persistent 1. Lumbar radiculopathy   2. Chronic radicular lumbar pain   3. Lumbar facet arthropathy   4.  Chronic pain syndrome      Updated Problems: Problem  Chronic Pain Syndrome    Plan of Care  Pharmacotherapy (Medications Ordered): Meds ordered this encounter  Medications  . methylPREDNISolone (MEDROL) 4 MG TBPK tablet    Sig: Follow package instructions.    Dispense:  21 tablet    Refill:  0    Do not add to the "Automatic Refill" notification system.  . pregabalin (LYRICA) 50 MG capsule    Sig: Take 1 capsule (50 mg total) by mouth 2 (two) times daily.    Dispense:  60 capsule    Refill:  1    Fill one day early if pharmacy is closed on scheduled refill date. May substitute for generic if available.  . orphenadrine (NORFLEX) injection 30 mg  . ketorolac (TORADOL) 30 MG/ML injection 30 mg   Medications administered today: We administered orphenadrine and ketorolac.  Follow-up next week for lumbar epidural steroid injection  Planned follow-up:   Return for [facility] appt.    Recent Visits Date Type Provider Dept  04/02/20 Telemedicine Gillis Santa, MD [facility]  03/19/20 Telemedicine Gillis Santa, MD [facility]  02/26/20 Office Visit Gillis Santa, MD [facility]  Showing recent visits within past 90 days and meeting all other requirements   Today's Visits Date Type Provider Dept  04/21/20 Office Visit Gillis Santa, MD [facility]  Showing today's visits and meeting all other requirements   Future Appointments Date Type Provider Dept  04/29/20 Appointment Gillis Santa, MD [facility]  04/30/20 Appointment Gillis Santa, MD [facility]  Showing future appointments within next 90 days and meeting all other requirements   Primary Care Physician: Pleas Koch, NP Location: [facility] Note by: Gillis Santa, MD Date: 04/21/2020; Time: 12:26 PM  Note: This dictation was prepared with Dragon dictation. Any transcriptional errors that may result from this process are  unintentional.

## 2020-04-22 ENCOUNTER — Telehealth: Payer: Self-pay | Admitting: Student in an Organized Health Care Education/Training Program

## 2020-04-22 ENCOUNTER — Telehealth: Payer: Self-pay | Admitting: *Deleted

## 2020-04-22 DIAGNOSIS — M5416 Radiculopathy, lumbar region: Secondary | ICD-10-CM

## 2020-04-22 DIAGNOSIS — G894 Chronic pain syndrome: Secondary | ICD-10-CM

## 2020-04-22 MED ORDER — METHYLPREDNISOLONE 4 MG PO TBPK: ORAL_TABLET | ORAL | 0 refills | Status: AC

## 2020-04-22 NOTE — Telephone Encounter (Signed)
Patient called to check on Rx that was not received by pharmacy.  When I checked her prednisone was still trying to transmit to the pharmacy. I told her that I would send a message to Dr Cherylann Ratel to see if he could resend this for her.  States she did get the Lyrica.

## 2020-04-22 NOTE — Telephone Encounter (Signed)
Requested Prescriptions   Signed Prescriptions Disp Refills  . methylPREDNISolone (MEDROL) 4 MG TBPK tablet 21 tablet 0    Sig: Follow package instructions.    Authorizing Provider: Lessie Funderburke    

## 2020-04-22 NOTE — Telephone Encounter (Signed)
Patient states she thought Dr. Cherylann Ratel was sending in 2 scripts but the pharmacy only received one. Please check on this and let patient know.

## 2020-04-23 ENCOUNTER — Telehealth: Payer: Self-pay | Admitting: Pulmonary Disease

## 2020-04-24 NOTE — Telephone Encounter (Signed)
Spoke with the pt  She was calling b/c received a call but no msg  I do not see anything in her chart besides upcoming appt with Dr Jayme Cloud on 04/28/20  Reminded her of appt date, time and location  Nothing further needed

## 2020-04-28 ENCOUNTER — Other Ambulatory Visit: Payer: Self-pay

## 2020-04-28 ENCOUNTER — Ambulatory Visit: Payer: Medicare HMO | Admitting: Pulmonary Disease

## 2020-04-28 ENCOUNTER — Encounter: Payer: Self-pay | Admitting: Pulmonary Disease

## 2020-04-28 VITALS — BP 142/70 | HR 85 | Temp 96.9°F | Ht 64.0 in | Wt 155.4 lb

## 2020-04-28 DIAGNOSIS — M069 Rheumatoid arthritis, unspecified: Secondary | ICD-10-CM

## 2020-04-28 DIAGNOSIS — M48062 Spinal stenosis, lumbar region with neurogenic claudication: Secondary | ICD-10-CM

## 2020-04-28 DIAGNOSIS — R0600 Dyspnea, unspecified: Secondary | ICD-10-CM

## 2020-04-28 NOTE — Progress Notes (Signed)
 Assessment & Plan:  1. Dyspnea, unspecified type (Primary) - Pulmonary Function Test [facility] Only; Future  2. Rheumatoid arthritis involving multiple joints (HCC)  3. Spinal stenosis, lumbar region, with neurogenic claudication   Patient Instructions  I do not see any evidence of major scarring in your lungs.  People with rheumatoid arthritis can develop scarring in the lungs but I do not see any evidence of that on your chest x-ray.  I do recommend a baseline breathing test mainly because of your rheumatoid arthritis and the therapies you are getting to make sure that we have a baseline.   We will see you in follow-up after the breathing tests are done, will be approximately 2 to 3 months.  Please note: late entry documentation due to logistical difficulties during COVID-19 pandemic. This note is filed for information purposes only, and is not intended to be used for billing, nor does it represent the full scope/nature of the visit in question. Please see any associated scanned media linked to date of encounter for additional pertinent information.  Subjective:    HPI: Casey [name] is a 74 y.o. female presenting to the pulmonology clinic on 04/28/2020 with report of: Consult (Patient had CXR on 4/12 and was sent here due to them seeing a scar on scan. Patient feels good overall from pulmonary standpoint.)     Outpatient Encounter Medications as of 04/28/2020  Medication Sig   [EXPIRED] methylPREDNISolone  (MEDROL ) 4 MG TBPK tablet Follow package instructions.   [DISCONTINUED] Adalimumab 40 MG/0.8ML PNKT Inject 0.8 mLs into the skin every 14 (fourteen) days. (Patient not taking: Reported on 01/12/2024)   [DISCONTINUED] alendronate (FOSAMAX) 70 MG tablet Take 70 mg by mouth every Saturday.    [DISCONTINUED] Aspirin-Caffeine (BAYER BACK & BODY) 500-32.5 MG TABS Take 1 tablet by mouth 2 (two) times daily. (Patient not taking: Reported on 05/26/2020)   [DISCONTINUED] Elastic Bandages &  Supports (MEDICAL COMPRESSION STOCKINGS) MISC 1 each by Does not apply route daily.   [DISCONTINUED] etodolac (LODINE) 400 MG tablet Take 400 mg by mouth daily as needed (pain).    [DISCONTINUED] lidocaine  (LIDODERM ) 5 % Place 1 patch onto the skin daily as needed (pain).  (Patient not taking: Reported on 12/14/2021)   [DISCONTINUED] predniSONE  (DELTASONE ) 5 MG tablet Take 2.5 mg by mouth daily. (Patient not taking: Reported on 11/15/2022)   [DISCONTINUED] pregabalin  (LYRICA ) 25 MG capsule Take 1 capsule (25 mg total) by mouth 2 (two) times daily. (Patient not taking: Reported on 04/29/2020)   [DISCONTINUED] pregabalin  (LYRICA ) 50 MG capsule Take 1 capsule (50 mg total) by mouth 2 (two) times daily. (Patient taking differently: Take 100 mg by mouth 2 (two) times daily. )   [DISCONTINUED] Teriparatide, Recombinant, (FORTEO ) Inject 20 mcg into the skin daily. (Patient not taking: Reported on 12/14/2021)   [DISCONTINUED] tiZANidine  (ZANAFLEX ) 4 MG tablet Take 1 tablet (4 mg total) by mouth 3 (three) times daily as needed. Taking 1 at night (Patient taking differently: Take 4 mg by mouth 3 (three) times daily as needed for muscle spasms. )   [DISCONTINUED] traMADol  (ULTRAM ) 50 MG tablet Take 50 mg by mouth 2 (two) times daily as needed. States she is not taking   No facility-administered encounter medications on file as of 04/28/2020.      Objective:   Vitals:   04/28/20 1139  BP: (!) 142/70  Pulse: 85  Temp: (!) 96.9 F (36.1 C)  Height: 5' 4 (1.626 m)  Weight: 155 lb 6.4 oz (  70.5 kg)  SpO2: 98%  TempSrc: Temporal  BMI (Calculated): 26.66     Physical exam documentation is limited by delayed entry of information.

## 2020-04-28 NOTE — Patient Instructions (Addendum)
I do not see any evidence of major scarring in your lungs.  People with rheumatoid arthritis can develop scarring in the lungs but I do not see any evidence of that on your chest x-ray.  I do recommend a baseline breathing test mainly because of your rheumatoid arthritis and the therapies you are getting to make sure that we have a baseline.   We will see you in follow-up after the breathing tests are done, will be approximately 2 to 3 months.

## 2020-04-29 ENCOUNTER — Encounter: Payer: Self-pay | Admitting: Student in an Organized Health Care Education/Training Program

## 2020-04-29 ENCOUNTER — Ambulatory Visit
Admission: RE | Admit: 2020-04-29 | Discharge: 2020-04-29 | Disposition: A | Discharge status: Home / Self Care | Payer: Medicare HMO | Source: Ambulatory Visit | Attending: Student in an Organized Health Care Education/Training Program | Admitting: Student in an Organized Health Care Education/Training Program

## 2020-04-29 ENCOUNTER — Ambulatory Visit (HOSPITAL_BASED_OUTPATIENT_CLINIC_OR_DEPARTMENT_OTHER): Payer: Medicare HMO | Admitting: Student in an Organized Health Care Education/Training Program

## 2020-04-29 VITALS — BP 179/91 | HR 88 | Temp 98.3°F | Resp 15 | Ht 64.0 in | Wt 150.0 lb

## 2020-04-29 DIAGNOSIS — M5416 Radiculopathy, lumbar region: Secondary | ICD-10-CM | POA: Diagnosis not present

## 2020-04-29 MED ORDER — ROPIVACAINE HCL 2 MG/ML IJ SOLN
2.0000 mL | Freq: Once | INTRAMUSCULAR | Status: AC
  Administered 2020-04-29: 10 mL via EPIDURAL

## 2020-04-29 MED ORDER — LIDOCAINE HCL 2 % IJ SOLN
20.0000 mL | Freq: Once | INTRAMUSCULAR | Status: AC
  Administered 2020-04-29: 400 mg

## 2020-04-29 MED ORDER — SODIUM CHLORIDE (PF) 0.9 % IJ SOLN
INTRAMUSCULAR | Status: AC
  Filled 2020-04-29: qty 10

## 2020-04-29 MED ORDER — IOHEXOL 180 MG/ML  SOLN
10.0000 mL | Freq: Once | INTRAMUSCULAR | Status: AC
  Administered 2020-04-29: 20 mL via EPIDURAL

## 2020-04-29 MED ORDER — DEXAMETHASONE SODIUM PHOSPHATE 10 MG/ML IJ SOLN
10.0000 mg | Freq: Once | INTRAMUSCULAR | Status: AC
  Administered 2020-04-29: 10 mg

## 2020-04-29 MED ORDER — SODIUM CHLORIDE 0.9% FLUSH
2.0000 mL | Freq: Once | INTRAVENOUS | Status: AC
  Administered 2020-04-29: 10 mL

## 2020-04-29 MED ORDER — LIDOCAINE HCL 2 % IJ SOLN
INTRAMUSCULAR | Status: AC
  Filled 2020-04-29: qty 20

## 2020-04-29 MED ORDER — DEXAMETHASONE SODIUM PHOSPHATE 10 MG/ML IJ SOLN
INTRAMUSCULAR | Status: AC
  Filled 2020-04-29: qty 1

## 2020-04-29 MED ORDER — ROPIVACAINE HCL 2 MG/ML IJ SOLN
INTRAMUSCULAR | Status: AC
  Filled 2020-04-29: qty 10

## 2020-04-29 NOTE — Patient Instructions (Signed)

## 2020-04-29 NOTE — Progress Notes (Signed)
Safety precautions to be maintained throughout the outpatient stay will include: orient to surroundings, keep bed in low position, maintain call bell within reach at all times, provide assistance with transfer out of bed and ambulation.  

## 2020-04-29 NOTE — Progress Notes (Signed)
PROVIDER NOTE: Information contained herein reflects review and annotations entered in association with encounter. Interpretation of such information and data should be left to medically-trained personnel. Information provided to patient can be located elsewhere in the medical record under "Patient Instructions". Document created using STT-dictation technology, any transcriptional errors that may result from process are unintentional.    Patient: Casey [name]  Service Category: Procedure  Provider: Edward Jolly, MD  DOB: 06-28-1946  DOS: 04/29/2020  Location: [facility]  MRN: [id number]  Setting: Ambulatory - outpatient  Referring Provider: Doreene Nest, NP  Type: Established Patient  Specialty: Interventional Pain Management  PCP: Doreene Nest, NP   Primary Reason for Visit: Interventional Pain Management Treatment. CC: Back Pain (low)  Procedure:          Anesthesia, Analgesia, Anxiolysis:  Type: Diagnostic Epidural Steroid Injection #1  Region: Caudal Level: Sacrococcygeal   Laterality: Midline       Type: Local Anesthesia  Local Anesthetic: Lidocaine 1-2%  Position: Prone   Indications: 1. Lumbar radiculopathy    Pain Score: Pre-procedure: 10-Worst pain ever/10 Post-procedure: 9 /10   *Of note, I initially attempted L2-L3 and L3-L4 interlaminar epidural steroid injection but due to facet arthrosis and significant spurs, was not able to enter the epidural space and patient was having increased low back pain so decided to change procedure to caudal ESI.  Pre-op Assessment:  Casey [name] is a 74 y.o. (year old), female patient, seen today for interventional treatment. She  has a past surgical history that includes Epidural block injection and Neck surgery. Casey [name] has a current medication list which includes the following prescription(s): adalimumab, alendronate, [employer] back & body, medical compression stockings, etodolac, lidocaine, prednisone, pregabalin,  teriparatide (recombinant), tizanidine, tramadol, acetaminophen, folic acid, and pregabalin. Her primarily concern today is the Back Pain (low)  Initial Vital Signs:  Pulse/HCG Rate: 88ECG Heart Rate: 96 Temp: 97.7 F (36.5 C) Resp: 18 BP: (!) 162/95 SpO2: 100 %  BMI: Estimated body mass index is 25.75 kg/m as calculated from the following:   Height as of this encounter: 5\' 4"  (1.626 m).   Weight as of this encounter: 150 lb (68 kg).  Risk Assessment: Allergies: Reviewed. She is allergic to leflunomide and methotrexate.  Allergy Precautions: None required Coagulopathies: Reviewed. None identified.  Blood-thinner therapy: None at this time Active Infection(s): Reviewed. None identified. Casey [name] is afebrile  Site Confirmation: Casey [name] was asked to confirm the procedure and laterality before marking the site Procedure checklist: Completed Consent: Before the procedure and under the influence of no sedative(s), amnesic(s), or anxiolytics, the patient was informed of the treatment options, risks and possible complications. To fulfill our ethical and legal obligations, as recommended by the American Medical Association's Code of Ethics, I have informed the patient of my clinical impression; the nature and purpose of the treatment or procedure; the risks, benefits, and possible complications of the intervention; the alternatives, including doing nothing; the risk(s) and benefit(s) of the alternative treatment(s) or procedure(s); and the risk(s) and benefit(s) of doing nothing. The patient was provided information about the general risks and possible complications associated with the procedure. These may include, but are not limited to: failure to achieve desired goals, infection, bleeding, organ or nerve damage, allergic reactions, paralysis, and death. In addition, the patient was informed of those risks and complications associated to Spine-related procedures, such as failure to decrease  pain; infection (i.e.: Meningitis, epidural or intraspinal abscess); bleeding (i.e.: epidural hematoma,  subarachnoid hemorrhage, or any other type of intraspinal or peri-dural bleeding); organ or nerve damage (i.e.: Any type of peripheral nerve, nerve root, or spinal cord injury) with subsequent damage to sensory, motor, and/or autonomic systems, resulting in permanent pain, numbness, and/or weakness of one or several areas of the body; allergic reactions; (i.e.: anaphylactic reaction); and/or death. Furthermore, the patient was informed of those risks and complications associated with the medications. These include, but are not limited to: allergic reactions (i.e.: anaphylactic or anaphylactoid reaction(s)); adrenal axis suppression; blood sugar elevation that in diabetics may result in ketoacidosis or comma; water retention that in patients with history of congestive heart failure may result in shortness of breath, pulmonary edema, and decompensation with resultant heart failure; weight gain; swelling or edema; medication-induced neural toxicity; particulate matter embolism and blood vessel occlusion with resultant organ, and/or nervous system infarction; and/or aseptic necrosis of one or more joints. Finally, the patient was informed that Medicine is not an exact science; therefore, there is also the possibility of unforeseen or unpredictable risks and/or possible complications that may result in a catastrophic outcome. The patient indicated having understood very clearly. We have given the patient no guarantees and we have made no promises. Enough time was given to the patient to ask questions, all of which were answered to the patient's satisfaction. Casey [name] has indicated that she wanted to continue with the procedure. Attestation: I, the ordering provider, attest that I have discussed with the patient the benefits, risks, side-effects, alternatives, likelihood of achieving goals, and potential problems  during recovery for the procedure that I have provided informed consent. Date  Time: 04/29/2020 10:17 AM  Pre-Procedure Preparation:  Monitoring: As per clinic protocol. Respiration, ETCO2, SpO2, BP, heart rate and rhythm monitor placed and checked for adequate function Safety Precautions: Patient was assessed for positional comfort and pressure points before starting the procedure. Time-out: I initiated and conducted the "Time-out" before starting the procedure, as per protocol. The patient was asked to participate by confirming the accuracy of the "Time Out" information. Verification of the correct person, site, and procedure were performed and confirmed by me, the nursing staff, and the patient. "Time-out" conducted as per Joint Commission's Universal Protocol (UP.01.01.01). Time: 1117  Description of Procedure:          Target Area: Caudal Epidural Canal. Approach: Midline approach. Area Prepped: Entire Posterior Sacrococcygeal Region DuraPrep (Iodine Povacrylex [0.7% available iodine] and Isopropyl Alcohol, 74% w/w) Safety Precautions: Aspiration looking for blood return was conducted prior to all injections. At no point did we inject any substances, as a needle was being advanced. No attempts were made at seeking any paresthesias. Safe injection practices and needle disposal techniques used. Medications properly checked for expiration dates. SDV (single dose vial) medications used. Description of the Procedure: Protocol guidelines were followed. The patient was placed in position over the fluoroscopy table. The target area was identified and the area prepped in the usual manner. Skin & deeper tissues infiltrated with local anesthetic. Appropriate amount of time allowed to pass for local anesthetics to take effect. The procedure needles were then advanced to the target area. Proper needle placement secured. Negative aspiration confirmed. Solution injected in intermittent fashion, asking for  systemic symptoms every 0.5cc of injectate. The needles were then removed and the area cleansed, making sure to leave some of the prepping solution back to take advantage of its long term bactericidal properties. Vitals:   04/29/20 1020 04/29/20 1118 04/29/20 1123 04/29/20 1128  BP: Marland Kitchen)  162/95 (!) 188/98 (!) 169/94 (!) 179/91  Pulse: 88     Resp: 18 12 13 15   Temp: 98.3 F (36.8 C)     SpO2: 100% 99% 100% 100%  Weight:      Height:        Start Time: 1117 hrs. End Time: 1128 hrs. Materials:  Needle(s) Type: Epidural needle Gauge: 22G Length: 3.5-in Medication(s): Please see orders for medications and dosing details. 6 cc solution made of 3 cc of preservative-free saline, 2 cc of 0.2% ropivacaine, 1 cc of Decadron 10 mg/cc.  Imaging Guidance (Spinal):          Type of Imaging Technique: Fluoroscopy Guidance (Spinal) Indication(s): Assistance in needle guidance and placement for procedures requiring needle placement in or near specific anatomical locations not easily accessible without such assistance. Exposure Time: Please see nurses notes. Contrast: Before injecting any contrast, we confirmed that the patient did not have an allergy to iodine, shellfish, or radiological contrast. Once satisfactory needle placement was completed at the desired level, radiological contrast was injected. Contrast injected under live fluoroscopy. No contrast complications. See chart for type and volume of contrast used. Fluoroscopic Guidance: I was personally present during the use of fluoroscopy. "Tunnel Vision Technique" used to obtain the best possible view of the target area. Parallax error corrected before commencing the procedure. "Direction-depth-direction" technique used to introduce the needle under continuous pulsed fluoroscopy. Once target was reached, antero-posterior, oblique, and lateral fluoroscopic projection used confirm needle placement in all planes. Images permanently stored in  EMR. Interpretation: I personally interpreted the imaging intraoperatively. Adequate needle placement confirmed in multiple planes. Appropriate spread of contrast into desired area was observed. No evidence of afferent or efferent intravascular uptake. No intrathecal or subarachnoid spread observed. Permanent images saved into the patient's record.  Antibiotic Prophylaxis:   Anti-infectives (From admission, onward)   None     Indication(s): None identified  Post-operative Assessment:  Post-procedure Vital Signs:  Pulse/HCG Rate: 8885 Temp: 98.3 F (36.8 C) Resp: 15 BP: (!) 179/91 SpO2: 100 %  EBL: None  Complications: No immediate post-treatment complications observed by team, or reported by patient.  Note: The patient tolerated the entire procedure well. A repeat set of vitals were taken after the procedure and the patient was kept under observation following institutional policy, for this type of procedure. Post-procedural neurological assessment was performed, showing return to baseline, prior to discharge. The patient was provided with post-procedure discharge instructions, including a section on how to identify potential problems. Should any problems arise concerning this procedure, the patient was given instructions to immediately contact us, at any time, without hesitation. In any case, we plan to contact the patient by telephone for a follow-up status report regarding this interventional procedure.  Comments:  No additional relevant information.  Plan of Care  Orders:  Orders Placed This Encounter  Procedures  . [facility] C-ARM 1-60 MIN NO REPORT    Intraoperative interpretation by procedural physician at [place].    Standing Status:   Standing    Number of Occurrences:   1    Order Specific Question:   Reason for exam:    Answer:   Assistance in needle guidance and placement for procedures requiring needle placement in or near specific anatomical  locations not easily accessible without such assistance.   Medications ordered for procedure: Meds ordered this encounter  Medications  . iohexol (OMNIPAQUE) 180 MG/ML injection 10 mL    Must be Myelogram-compatible. If  not available, you may substitute with a water-soluble, non-ionic, hypoallergenic, myelogram-compatible radiological contrast medium.  Marland Kitchen lidocaine (XYLOCAINE) 2 % (with pres) injection 400 mg  . ropivacaine (PF) 2 mg/mL (0.2%) (NAROPIN) injection 2 mL  . sodium chloride flush (NS) 0.9 % injection 2 mL  . dexamethasone (DECADRON) injection 10 mg   Medications administered: We administered iohexol, lidocaine, ropivacaine (PF) 2 mg/mL (0.2%), sodium chloride flush, and dexamethasone.  See the medical record for exact dosing, route, and time of administration.  Follow-up plan:   Return in about 4 weeks (around 05/27/2020) for Post Procedure Evaluation, virtual.     Recent Visits Date Type Provider Dept  04/21/20 Office Visit Edward Jolly, MD [facility]  04/02/20 Telemedicine Edward Jolly, MD [facility]  03/19/20 Telemedicine Edward Jolly, MD [facility]  02/26/20 Office Visit Edward Jolly, MD [facility]  Showing recent visits within past 90 days and meeting all other requirements   Today's Visits Date Type Provider Dept  04/29/20 Procedure visit Edward Jolly, MD [facility]  Showing today's visits and meeting all other requirements   Future Appointments Date Type Provider Dept  05/27/20 Appointment Edward Jolly, MD [facility]  Showing future appointments within next 90 days and meeting all other requirements   Disposition: Discharge home  Discharge (Date  Time): 04/29/2020; 1135 hrs.   Primary Care Physician: Doreene Nest, NP Location: [facility] Note by: Edward Jolly, MD Date: 04/29/2020; Time: 1:36 PM  Disclaimer:  Medicine is not an exact science. The only  guarantee in medicine is that nothing is guaranteed. It is important to note that the decision to proceed with this intervention was based on the information collected from the patient. The Data and conclusions were drawn from the patient's questionnaire, the interview, and the physical examination. Because the information was provided in large part by the patient, it cannot be guaranteed that it has not [name] purposely or unconsciously manipulated. Every effort has [name] made to obtain as much relevant data as possible for this evaluation. It is important to note that the conclusions that lead to this procedure are derived in large part from the available data. Always take into account that the treatment will also be dependent on availability of resources and existing treatment guidelines, considered by other Pain Management Practitioners as being common knowledge and practice, at the time of the intervention. For Medico-Legal purposes, it is also important to point out that variation in procedural techniques and pharmacological choices are the acceptable norm. The indications, contraindications, technique, and results of the above procedure should only be interpreted and judged by a Board-Certified Interventional Pain Specialist with extensive familiarity and expertise in the same exact procedure and technique.

## 2020-04-30 ENCOUNTER — Telehealth: Payer: Medicare HMO | Admitting: Student in an Organized Health Care Education/Training Program

## 2020-05-04 ENCOUNTER — Other Ambulatory Visit: Payer: Self-pay | Admitting: Student in an Organized Health Care Education/Training Program

## 2020-05-04 DIAGNOSIS — M5416 Radiculopathy, lumbar region: Secondary | ICD-10-CM

## 2020-05-04 DIAGNOSIS — G894 Chronic pain syndrome: Secondary | ICD-10-CM

## 2020-05-04 MED ORDER — PREGABALIN 50 MG PO CAPS: 100.0000 mg | ORAL_CAPSULE | Freq: Two times a day (BID) | ORAL | 2 refills | Status: DC

## 2020-05-04 NOTE — Progress Notes (Signed)
Patient notified

## 2020-05-05 ENCOUNTER — Telehealth: Payer: Self-pay

## 2020-05-05 ENCOUNTER — Encounter: Payer: Self-pay | Admitting: Student in an Organized Health Care Education/Training Program

## 2020-05-05 NOTE — Telephone Encounter (Signed)
Returned patient phone call re; issues from epidural.  She states that she is very swollen in her legs, hands, perineum and buttocks area.  States she thinks that this started approx a couple of days post procedure.  Asked if she has been drinking plenty of fluids and she states she has been drinking water and Gatorade.  States she feels like she has no legs and they are so tight.  States she is having difficulty walking and has to be very careful.  Denies this much swelling has ever happened to her before.  Denies pain relief from epidural.  I told her that I would relay this information to Dr Cherylann Ratel and let her know his reply.

## 2020-05-05 NOTE — Telephone Encounter (Signed)
She had epidural last week, hands and feet still swelling, wants to know if that's normal

## 2020-05-06 ENCOUNTER — Encounter: Payer: Self-pay | Admitting: Student in an Organized Health Care Education/Training Program

## 2020-05-06 ENCOUNTER — Ambulatory Visit
Payer: Medicare HMO | Attending: Student in an Organized Health Care Education/Training Program | Admitting: Student in an Organized Health Care Education/Training Program

## 2020-05-06 ENCOUNTER — Other Ambulatory Visit: Payer: Self-pay

## 2020-05-06 VITALS — BP 149/83 | HR 81 | Temp 97.7°F | Ht 64.0 in | Wt 150.0 lb

## 2020-05-06 DIAGNOSIS — M5416 Radiculopathy, lumbar region: Secondary | ICD-10-CM | POA: Diagnosis not present

## 2020-05-06 DIAGNOSIS — G8929 Other chronic pain: Secondary | ICD-10-CM

## 2020-05-06 DIAGNOSIS — M47816 Spondylosis without myelopathy or radiculopathy, lumbar region: Secondary | ICD-10-CM | POA: Diagnosis not present

## 2020-05-06 DIAGNOSIS — G894 Chronic pain syndrome: Secondary | ICD-10-CM

## 2020-05-06 DIAGNOSIS — M48062 Spinal stenosis, lumbar region with neurogenic claudication: Secondary | ICD-10-CM | POA: Diagnosis not present

## 2020-05-06 NOTE — Progress Notes (Signed)
PROVIDER NOTE: Information contained herein reflects review and annotations entered in association with encounter. Interpretation of such information and data should be left to medically-trained personnel. Information provided to patient can be located elsewhere in the medical record under "Patient Instructions". Document created using STT-dictation technology, any transcriptional errors that may result from process are unintentional.    Patient: Casey [name]  Service Category: E/M  Provider: Gillis Santa, MD  DOB: 01-02-1946  DOS: 05/06/2020  Specialty: Interventional Pain Management  MRN: [id number]  Setting: Ambulatory outpatient  PCP: Pleas Koch, NP  Type: Established Patient    Referring Provider: Pleas Koch, NP  Location: Office  Delivery: Face-to-face     HPI  Reason for encounter: Casey [name], a 74 y.o. year old female, is here today for evaluation and management of her Chronic radicular lumbar pain [M54.16, G89.29]. Casey [name] primary complain today is Rectal Pain Last encounter: Practice (05/05/2020). My last encounter with her was on 04/29/2020. Pertinent problems: Casey [name] has Chronic radicular lumbar pain; Lumbar radiculopathy; Lumbar facet arthropathy; Spinal stenosis, lumbar region, with neurogenic claudication; and Chronic pain syndrome on their pertinent problem list. Pain Assessment: Severity of Chronic pain is reported as a 9 /10. Location: Groin Right, Left, Lower, Mid/Pain radiaties from buttock to hands and feet. Onset: More than a month ago. Quality: Aching, Burning, Throbbing, Tightness, Pressure. Timing: Constant. Modifying factor(s): meds, sitting on a heating pad. Vitals:  height is 5' 4"  (1.626 m) and weight is 150 lb (68 kg). Her temperature is 97.7 F (36.5 C). Her blood pressure is 149/83 (abnormal) and her pulse is 81. Her oxygen saturation is 100%.   Patient is status post caudal ESI on 04/29/2020.  She presents today because she is having swelling  in her lower extremity, swelling around her groin region as well as numbness along her right lateral leg in a dermatomal fashion.  She does have full strength in her lower extremity.  She states that she does have intermittent swelling at times but this is worse.  She is scheduled for a lumbar MRI in 2 days which I encouraged her to follow through with as this will help [place] evaluate etiology of her pain.  At this time, I do not recommend a steroid taper or any additional interventions.  I have encouraged the patient to continue Lyrica and supplement with acetaminophen 500 mg 4 times daily as needed.  Patient endorsed understanding.  ROS  Constitutional: Denies any fever or chills Gastrointestinal: No reported hemesis, hematochezia, vomiting, or acute GI distress Musculoskeletal: Right lower extremity paresthesias, numbness. Neurological: No reported episodes of acute onset apraxia, aphasia, dysarthria, agnosia, amnesia, paralysis, loss of coordination, or loss of consciousness  Medication Review  Adalimumab, Aspirin-Caffeine, Medical Compression Stockings, Teriparatide (Recombinant), acetaminophen, alendronate, etodolac, folic acid, lidocaine, predniSONE, pregabalin, tiZANidine, and traMADol  History Review  Allergy: Casey [name] is allergic to leflunomide and methotrexate. Drug: Casey [name]  reports no history of drug use. Alcohol:  reports no history of alcohol use. Tobacco:  reports that she has never smoked. She has never used smokeless tobacco. Social: Casey [name]  reports that she has never smoked. She has never used smokeless tobacco. She reports that she does not drink alcohol or use drugs. Medical:  has a past medical history of Allergy, Chronic back pain, Osteoporosis, and Rheumatoid arthritis ([place]). Surgical: Casey [name]  has a past surgical history that includes Epidural block injection and Neck surgery. Family: family history includes Bone cancer in her  brother; Heart attack in her brother and  mother; Mental illness in her mother.  Laboratory Chemistry Profile   Renal Lab Results  Component Value Date   BUN 15 04/04/2020   CREATININE 0.79 04/04/2020   GFRAA >60 04/04/2020   GFRNONAA >60 04/04/2020     Hepatic Lab Results  Component Value Date   AST 25 04/04/2020   ALT 13 04/04/2020   ALBUMIN 4.4 04/04/2020   ALKPHOS 93 04/04/2020     Electrolytes Lab Results  Component Value Date   NA 141 04/04/2020   K 4.0 04/04/2020   CL 105 04/04/2020   CALCIUM 9.7 04/04/2020     Bone No results found for: VD25OH, VD125OH2TOT, MO7078ML5, QG9201EO7, 25OHVITD1, 25OHVITD2, 25OHVITD3, TESTOFREE, TESTOSTERONE   Inflammation (CRP: Acute Phase) (ESR: Chronic Phase) No results found for: CRP, ESRSEDRATE, LATICACIDVEN     Note: Above Lab results reviewed.  Recent Imaging Review  DG PAIN CLINIC C-ARM 1-60 MIN NO REPORT Fluoro was used, but no Radiologist interpretation will be provided.  Please refer to "NOTES" tab for provider progress note. Note: Reviewed        Physical Exam  General appearance: Well nourished, well developed, and well hydrated. In no apparent acute distress Mental status: Alert, oriented x 3 (person, place, & time)       Respiratory: No evidence of acute respiratory distress Eyes: PERLA Vitals: BP (!) 149/83   Pulse 81   Temp 97.7 F (36.5 C)   Ht 5' 4"  (1.626 m)   Wt 150 lb (68 kg)   SpO2 100%   BMI 25.75 kg/m  BMI: Estimated body mass index is 25.75 kg/m as calculated from the following:   Height as of this encounter: 5' 4"  (1.626 m).   Weight as of this encounter: 150 lb (68 kg). Ideal: Ideal body weight: 54.7 kg (120 lb 9.5 oz) Adjusted ideal body weight: 60 kg (132 lb 5.7 oz)  Lumbar Spine Area Exam  Skin & Axial Inspection: No masses, redness, or swelling Alignment: Symmetrical Functional ROM: Pain restricted ROM affecting both sides Stability: No instability detected Muscle Tone/Strength: Functionally intact. No obvious  neuro-muscular anomalies detected. Sensory (Neurological): Dermatomal pain pattern  Gait & Posture Assessment  Ambulation: Limited Gait: Antalgic Posture: Difficulty standing up straight, due to pain   Lower Extremity Exam    Side: Right lower extremity  Side: Left lower extremity  Stability: No instability observed          Stability: No instability observed          Skin & Extremity Inspection: Pitting edema  Skin & Extremity Inspection: Pitting edema  Functional ROM: Pain restricted ROM for all joints of the lower extremity          Functional ROM: Pain restricted ROM for all joints of the lower extremity          Muscle Tone/Strength: Functionally intact. No obvious neuro-muscular anomalies detected.  Muscle Tone/Strength: Functionally intact. No obvious neuro-muscular anomalies detected.  Sensory (Neurological): Dermatomal pain pattern        Sensory (Neurological): Dermatomal pain pattern        DTR: Patellar: 1+: trace Achilles: 1+: trace Plantar: deferred today  DTR: Patellar: 1+: trace Achilles: 1+: trace Plantar: deferred today        Assessment   Status Diagnosis  Having a Flare-up Having a Flare-up Persistent 1. Chronic radicular lumbar pain   2. Lumbar radiculopathy   3. Lumbar facet arthropathy   4. Spinal stenosis, lumbar region,  with neurogenic claudication   5. Chronic pain syndrome      Updated Problems: Problem  Chronic Pain Syndrome  Chronic Radicular Lumbar Pain  Lumbar Radiculopathy  Lumbar Facet Arthropathy  Spinal Stenosis, Lumbar Region, With Neurogenic Claudication    Plan of Care   Continue symptomatic management.  Encourage patient to continue Lyrica and add acetaminophen for pain control.  We will continue to monitor her symptoms.  Encouraged her to follow through with her lumbar MRI which will be helpful in evaluating her lumbar spine pathology.  Patient will follow up with me later this month.  Follow-up plan:   Return for Keep sch.  appt.   Recent Visits Date Type Provider Dept  04/29/20 Procedure visit Gillis Santa, MD [facility]  04/21/20 Office Visit Gillis Santa, MD [facility]  04/02/20 Telemedicine Gillis Santa, MD [facility]  03/19/20 Telemedicine Gillis Santa, MD [facility]  02/26/20 Office Visit Gillis Santa, MD [facility]  Showing recent visits within past 90 days and meeting all other requirements   Today's Visits Date Type Provider Dept  05/06/20 Office Visit Gillis Santa, MD [facility]  Showing today's visits and meeting all other requirements   Future Appointments Date Type Provider Dept  05/27/20 Appointment Gillis Santa, MD [facility]  Showing future appointments within next 90 days and meeting all other requirements   I discussed the assessment and treatment plan with the patient. The patient was provided an opportunity to ask questions and all were answered. The patient agreed with the plan and demonstrated an understanding of the instructions.  Patient advised to call back or seek an in-person evaluation if the symptoms or condition worsens.  Duration of encounter: 54mnutes.  Note by: BGillis Santa MD Date: 05/06/2020; Time: 9:59 AM

## 2020-05-06 NOTE — Progress Notes (Signed)
Safety precautions to be maintained throughout the outpatient stay will include: orient to surroundings, keep bed in low position, maintain call bell within reach at all times, provide assistance with transfer out of bed and ambulation.  

## 2020-05-08 ENCOUNTER — Ambulatory Visit
Admission: RE | Admit: 2020-05-08 | Discharge: 2020-05-08 | Disposition: A | Discharge status: Home / Self Care | Payer: Medicare HMO | Source: Ambulatory Visit | Attending: Neurosurgery | Admitting: Neurosurgery

## 2020-05-08 ENCOUNTER — Other Ambulatory Visit: Payer: Self-pay

## 2020-05-08 DIAGNOSIS — M4310 Spondylolisthesis, site unspecified: Secondary | ICD-10-CM | POA: Insufficient documentation

## 2020-05-08 DIAGNOSIS — M5416 Radiculopathy, lumbar region: Secondary | ICD-10-CM | POA: Diagnosis not present

## 2020-05-08 DIAGNOSIS — M545 Low back pain: Secondary | ICD-10-CM | POA: Diagnosis not present

## 2020-05-11 ENCOUNTER — Other Ambulatory Visit: Payer: Self-pay | Admitting: Neurosurgery

## 2020-05-13 ENCOUNTER — Telehealth: Payer: Self-pay | Admitting: Student in an Organized Health Care Education/Training Program

## 2020-05-13 NOTE — Telephone Encounter (Signed)
Patient called stating she had MRI done Friday and would like to know what Dr. Cherylann Ratel thinks.

## 2020-05-13 NOTE — Telephone Encounter (Signed)
Dr. Myer Haff ordered this MRI. Do you wish to discuss results with her?

## 2020-05-14 ENCOUNTER — Telehealth: Payer: Self-pay | Admitting: *Deleted

## 2020-05-14 NOTE — Telephone Encounter (Signed)
Patient instructed to discuss results with Dr. Myer Haff, as instructed by Dr. Cherylann Ratel.

## 2020-05-15 ENCOUNTER — Telehealth: Payer: Self-pay

## 2020-05-15 NOTE — Telephone Encounter (Signed)
Pt is aware of date/time of covid test prior to PFT. Nothing further is needed.  

## 2020-05-18 ENCOUNTER — Other Ambulatory Visit: Payer: Self-pay

## 2020-05-18 ENCOUNTER — Other Ambulatory Visit
Admission: RE | Admit: 2020-05-18 | Discharge: 2020-05-18 | Disposition: A | Discharge status: Home / Self Care | Payer: Medicare HMO | Source: Ambulatory Visit | Attending: Pulmonary Disease | Admitting: Pulmonary Disease

## 2020-05-18 DIAGNOSIS — Z01812 Encounter for preprocedural laboratory examination: Secondary | ICD-10-CM | POA: Diagnosis not present

## 2020-05-18 DIAGNOSIS — Z20822 Contact with and (suspected) exposure to covid-19: Secondary | ICD-10-CM | POA: Insufficient documentation

## 2020-05-19 ENCOUNTER — Ambulatory Visit: Payer: Medicare HMO | Attending: Pulmonary Disease

## 2020-05-19 DIAGNOSIS — R0600 Dyspnea, unspecified: Secondary | ICD-10-CM | POA: Insufficient documentation

## 2020-05-19 LAB — SARS CORONAVIRUS 2 (TAT 6-24 HRS): SARS Coronavirus 2: NEGATIVE

## 2020-05-19 MED ORDER — ALBUTEROL SULFATE (2.5 MG/3ML) 0.083% IN NEBU
2.5000 mg | INHALATION_SOLUTION | Freq: Once | RESPIRATORY_TRACT | Status: AC
  Administered 2020-05-19: 2.5 mg via RESPIRATORY_TRACT
  Filled 2020-05-19: qty 3

## 2020-05-20 ENCOUNTER — Telehealth: Payer: Self-pay | Admitting: Student in an Organized Health Care Education/Training Program

## 2020-05-20 NOTE — Telephone Encounter (Signed)
Has some questions about what is happining in her leg since procedure. Please call.

## 2020-05-20 NOTE — Telephone Encounter (Signed)
Talked with patient and she continues to have pain and swelling in the legs.  This is the same kind of issue that she was having when we brought her in On June 9, post procedure.  I did ask her if she is continuing her LYrica and tylenol and she states that she is.  Ask her about Tizanidine, states she was told not to take that with Lyrica.  Ask if she would like a sooner appt than what she is scheduled for on 05/27/20.  States she just wanted to see if there is a medicine that Dr Cherylann Ratel could give her until she has her surgery in August.  Again, offered a sooner appt and she declined. States she has been in contact with Dr Lucienne Capers office and is waiting on them to call her back.

## 2020-05-21 ENCOUNTER — Ambulatory Visit (INDEPENDENT_AMBULATORY_CARE_PROVIDER_SITE_OTHER): Payer: Medicare HMO

## 2020-05-21 VITALS — Wt 150.0 lb

## 2020-05-21 DIAGNOSIS — Z0000 Encounter for general adult medical examination without abnormal findings: Secondary | ICD-10-CM | POA: Diagnosis not present

## 2020-05-21 NOTE — Progress Notes (Signed)
PCP notes:  Health Maintenance: Colonoscopy- no longer required per patient Mammogram- no longer required per patient    Abnormal Screenings: none   Patient concerns: none   Nurse concerns: none  Next PCP appt.: none

## 2020-05-21 NOTE — Patient Instructions (Signed)
Casey [name] , Thank you for taking time to come for your Medicare Wellness Visit. I appreciate your ongoing commitment to your health goals. Please review the following plan we discussed and let me know if I can assist you in the future.   Screening recommendations/referrals: Colonoscopy: no longer required per patient Mammogram: no longer required per patient Bone Density: completed 06/2019 per patient repeat 5 years Recommended yearly ophthalmology/optometry visit for glaucoma screening and checkup Recommended yearly dental visit for hygiene and checkup  Vaccinations: Influenza vaccine: Fall 2021 Pneumococcal vaccine: Completed series Tdap vaccine: Up to date, completed 07/16/2018, due 06/2028 Shingles vaccine: due, check with insurance regarding coverage   Covid-19:Completed series  Advanced directives: Please bring a copy of your POA (Power of Attorney) and/or Living Will to your next appointment.   Conditions/risks identified: none  Next appointment: Follow up in one year for your annual wellness visit    Preventive Care 65 Years and Older, Female Preventive care refers to lifestyle choices and visits with your health care provider that can promote health and wellness. What does preventive care include?  A yearly physical exam. This is also called an annual well check.  Dental exams once or twice a year.  Routine eye exams. Ask your health care provider how often you should have your eyes checked.  Personal lifestyle choices, including:  Daily care of your teeth and gums.  Regular physical activity.  Eating a healthy diet.  Avoiding tobacco and drug use.  Limiting alcohol use.  Practicing safe sex.  Taking low-dose aspirin every day.  Taking vitamin and mineral supplements as recommended by your health care provider. What happens during an annual well check? The services and screenings done by your health care provider during your annual well check will depend on  your age, overall health, lifestyle risk factors, and family history of disease. Counseling  Your health care provider may ask you questions about your:  Alcohol use.  Tobacco use.  Drug use.  Emotional well-being.  Home and relationship well-being.  Sexual activity.  Eating habits.  History of falls.  Memory and ability to understand (cognition).  Work and work Astronomer.  Reproductive health. Screening  You may have the following tests or measurements:  Height, weight, and BMI.  Blood pressure.  Lipid and cholesterol levels. These may be checked every 5 years, or more frequently if you are over 60 years old.  Skin check.  Lung cancer screening. You may have this screening every year starting at age 85 if you have a 30-pack-year history of smoking and currently smoke or have quit within the past 15 years.  Fecal occult blood test (FOBT) of the stool. You may have this test every year starting at age 74.  Flexible sigmoidoscopy or colonoscopy. You may have a sigmoidoscopy every 5 years or a colonoscopy every 10 years starting at age 44.  Hepatitis C blood test.  Hepatitis B blood test.  Sexually transmitted disease (STD) testing.  Diabetes screening. This is done by checking your blood sugar (glucose) after you have not eaten for a while (fasting). You may have this done every 1-3 years.  Bone density scan. This is done to screen for osteoporosis. You may have this done starting at age 48.  Mammogram. This may be done every 1-2 years. Talk to your health care provider about how often you should have regular mammograms. Talk with your health care provider about your test results, treatment options, and if necessary, the need for more  tests. Vaccines  Your health care provider may recommend certain vaccines, such as:  Influenza vaccine. This is recommended every year.  Tetanus, diphtheria, and acellular pertussis (Tdap, Td) vaccine. You may need a Td booster  every 10 years.  Zoster vaccine. You may need this after age 18.  Pneumococcal 13-valent conjugate (PCV13) vaccine. One dose is recommended after age 1.  Pneumococcal polysaccharide (PPSV23) vaccine. One dose is recommended after age 14. Talk to your health care provider about which screenings and vaccines you need and how often you need them. This information is not intended to replace advice given to you by your health care provider. Make sure you discuss any questions you have with your health care provider. Document Released: 12/11/2015 Document Revised: 08/03/2016 Document Reviewed: 09/15/2015 Elsevier Interactive Patient Education  2017 [place] Prevention in the Home Falls can cause injuries. They can happen to people of all ages. There are many things you can do to make your home safe and to help prevent falls. What can I do on the outside of my home?  Regularly fix the edges of walkways and driveways and fix any cracks.  Remove anything that might make you trip as you walk through a door, such as a raised step or threshold.  Trim any bushes or trees on the path to your home.  Use bright outdoor lighting.  Clear any walking paths of anything that might make someone trip, such as rocks or tools.  Regularly check to see if handrails are loose or broken. Make sure that both sides of any steps have handrails.  Any raised decks and porches should have guardrails on the edges.  Have any leaves, snow, or ice cleared regularly.  Use sand or salt on walking paths during winter.  Clean up any spills in your garage right away. This includes oil or grease spills. What can I do in the bathroom?  Use night lights.  Install grab bars by the toilet and in the tub and shower. Do not use towel bars as grab bars.  Use non-skid mats or decals in the tub or shower.  If you need to sit down in the shower, use a plastic, non-slip stool.  Keep the floor dry. Clean up any  water that spills on the floor as soon as it happens.  Remove soap buildup in the tub or shower regularly.  Attach bath mats securely with double-sided non-slip rug tape.  Do not have throw rugs and other things on the floor that can make you trip. What can I do in the bedroom?  Use night lights.  Make sure that you have a light by your bed that is easy to reach.  Do not use any sheets or blankets that are too big for your bed. They should not hang down onto the floor.  Have a firm chair that has side arms. You can use this for support while you get dressed.  Do not have throw rugs and other things on the floor that can make you trip. What can I do in the kitchen?  Clean up any spills right away.  Avoid walking on wet floors.  Keep items that you use a lot in easy-to-reach places.  If you need to reach something above you, use a strong step stool that has a grab bar.  Keep electrical cords out of the way.  Do not use floor polish or wax that makes floors slippery. If you must use wax, use non-skid floor  wax.  Do not have throw rugs and other things on the floor that can make you trip. What can I do with my stairs?  Do not leave any items on the stairs.  Make sure that there are handrails on both sides of the stairs and use them. Fix handrails that are broken or loose. Make sure that handrails are as long as the stairways.  Check any carpeting to make sure that it is firmly attached to the stairs. Fix any carpet that is loose or worn.  Avoid having throw rugs at the top or bottom of the stairs. If you do have throw rugs, attach them to the floor with carpet tape.  Make sure that you have a light switch at the top of the stairs and the bottom of the stairs. If you do not have them, ask someone to add them for you. What else can I do to help prevent falls?  Wear shoes that:  Do not have high heels.  Have rubber bottoms.  Are comfortable and fit you well.  Are closed  at the toe. Do not wear sandals.  If you use a stepladder:  Make sure that it is fully opened. Do not climb a closed stepladder.  Make sure that both sides of the stepladder are locked into place.  Ask someone to hold it for you, if possible.  Clearly mark and make sure that you can see:  Any grab bars or handrails.  First and last steps.  Where the edge of each step is.  Use tools that help you move around (mobility aids) if they are needed. These include:  Canes.  Walkers.  Scooters.  Crutches.  Turn on the lights when you go into a dark area. Replace any light bulbs as soon as they burn out.  Set up your furniture so you have a clear path. Avoid moving your furniture around.  If any of your floors are uneven, fix them.  If there are any pets around you, be aware of where they are.  Review your medicines with your doctor. Some medicines can make you feel dizzy. This can increase your chance of falling. Ask your doctor what other things that you can do to help prevent falls. This information is not intended to replace advice given to you by your health care provider. Make sure you discuss any questions you have with your health care provider. Document Released: 09/10/2009 Document Revised: 04/21/2016 Document Reviewed: 12/19/2014 Elsevier Interactive Patient Education  2017 [employer].

## 2020-05-21 NOTE — Progress Notes (Signed)
Subjective:   Casey [name] is a 74 y.o. female who presents for Medicare Annual (Subsequent) preventive examination.  Review of Systems: N/A     I connected with the patient today by telephone and verified that I am speaking with the correct person using two identifiers. Location patient: home Location nurse: work Persons participating in the virtual visit: patient, Engineer, civil (consulting).   I discussed the limitations, risks, security and privacy concerns of performing an evaluation and management service by telephone and the availability of in person appointments. I also discussed with the patient that there may be a patient responsible charge related to this service. The patient expressed understanding and verbally consented to this telephonic visit.    Interactive audio and video telecommunications were attempted between this nurse and patient, however failed, due to patient having technical difficulties OR patient did not have access to video capability.  We continued and completed visit with audio only.     Cardiac Risk Factors include: advanced age (>16men, >52 women)     Objective:    Today's Vitals   05/21/20 1354 05/21/20 1358  Weight: 150 lb (68 kg)   PainSc:  9    Body mass index is 25.75 kg/m.  Advanced Directives 05/21/2020 04/29/2020 04/04/2020 08/13/2019 07/26/2019 07/24/2019 01/07/2016  Does Patient Have a Medical Advance Directive? Yes Yes Yes No No No;Yes Yes  Type of Estate agent of [place];Living will Living will Healthcare Power of [place];Living will - Healthcare Power of [place];Living will Healthcare Power of [place];Out of facility DNR (pink MOST or yellow form);Living will -  Does patient want to make changes to medical advance directive? - - No - Patient declined - - - -  Copy of Healthcare Power of Attorney in Chart? No - copy requested - No - copy requested - - - -  Would patient like information on creating a medical advance directive? - - - No -  Patient declined - - -    Current Medications (verified) Outpatient Encounter Medications as of 05/21/2020  Medication Sig  . acetaminophen (TYLENOL) 500 MG tablet Take by mouth.  . Adalimumab 40 MG/0.8ML PNKT Inject 0.8 mLs into the skin every 14 (fourteen) days.  Marland Kitchen alendronate (FOSAMAX) 70 MG tablet Take 70 mg by mouth once a week.  . Aspirin-Caffeine (BAYER BACK & BODY) 500-32.5 MG TABS Take 1 tablet by mouth 2 (two) times daily.  Jae Dire Bandages & Supports (MEDICAL COMPRESSION STOCKINGS) MISC 1 each by Does not apply route daily.  Marland Kitchen etodolac (LODINE) 400 MG tablet Take 400 mg by mouth 2 (two) times daily as needed.  . folic acid (FOLVITE) 1 MG tablet Take by mouth.  . lidocaine (LIDODERM) 5 % Place onto the skin.  . predniSONE (DELTASONE) 5 MG tablet Take 2.5 mg by mouth daily.  . pregabalin (LYRICA) 50 MG capsule Take 2 capsules (100 mg total) by mouth 2 (two) times daily.  . Teriparatide, Recombinant, (FORTEO [place]) Inject 20 mcg into the skin daily.  Marland Kitchen tiZANidine (ZANAFLEX) 4 MG tablet Take 1 tablet (4 mg total) by mouth 3 (three) times daily as needed. Taking 1 at night  . traMADol (ULTRAM) 50 MG tablet Take 50 mg by mouth 2 (two) times daily as needed. States she is not taking   No facility-administered encounter medications on file as of 05/21/2020.    Allergies (verified) Leflunomide and Methotrexate   History: Past Medical History:  Diagnosis Date  . Allergy   . Chronic back pain   .  Osteoporosis   . Rheumatoid arthritis [facility])    Past Surgical History:  Procedure Laterality Date  . EPIDURAL BLOCK INJECTION    . NECK SURGERY     Family History  Problem Relation Age of Onset  . Heart attack Mother   . Mental illness Mother   . Bone cancer Brother   . Heart attack Brother    Social History   Socioeconomic History  . Marital status: Widowed    Spouse name: Not on file  . Number of children: Not on file  . Years of education: Not on file  . Highest education  level: Not on file  Occupational History  . Not on file  Tobacco Use  . Smoking status: Never Smoker  . Smokeless tobacco: Never Used  Vaping Use  . Vaping Use: Never used  Substance and Sexual Activity  . Alcohol use: Never  . Drug use: Never  . Sexual activity: Not on file  Other Topics Concern  . Not on file  Social History Narrative  . Not on file   Social Determinants of Health   Financial Resource Strain: Low Risk   . Difficulty of Paying Living Expenses: Not hard at all  Food Insecurity: No Food Insecurity  . Worried About Programme researcher, broadcasting/film/video in the Last Year: Never true  . Ran Out of Food in the Last Year: Never true  Transportation Needs: No Transportation Needs  . Lack of Transportation (Medical): No  . Lack of Transportation (Non-Medical): No  Physical Activity: Inactive  . Days of Exercise per Week: 0 days  . Minutes of Exercise per Session: 0 min  Stress: No Stress Concern Present  . Feeling of Stress : Not at all  Social Connections:   . Frequency of Communication with Friends and Family:   . Frequency of Social Gatherings with Friends and Family:   . Attends Religious Services:   . Active Member of Clubs or Organizations:   . Attends Banker Meetings:   Marland Kitchen Marital Status:     Tobacco Counseling Counseling given: Not Answered   Clinical Intake:  Pre-visit preparation completed: Yes  Pain : 0-10 Pain Score: 9  Pain Type: Chronic pain Pain Location: Back Pain Orientation: Lower Pain Descriptors / Indicators: Spasm Pain Onset: More than a month ago Pain Frequency: Intermittent     Nutritional Risks: None Diabetes: No  How often do you need to have someone help you when you read instructions, pamphlets, or other written materials from your doctor or pharmacy?: 1 - Never What is the last grade level you completed in school?: some college  Diabetic: No Nutrition Risk Assessment:  Has the patient had any N/V/D within the last 2  months?  No  Does the patient have any non-healing wounds?  No  Has the patient had any unintentional weight loss or weight gain?  No   Diabetes:  Is the patient diabetic?  No  If diabetic, was a CBG obtained today?  No  Did the patient bring in their glucometer from home?  No  How often do you monitor your CBG's? N/A.   Financial Strains and Diabetes Management:  Are you having any financial strains with the device, your supplies or your medication? No .  Does the patient want to be seen by Chronic Care Management for management of their diabetes?  No  Would the patient like to be referred to a Nutritionist or for Diabetic Management?  No     Interpreter  Needed?: No  Information entered by :: CJohnson, LPN   Activities of Daily Living In your present state of health, do you have any difficulty performing the following activities: 05/21/2020  Hearing? N  Vision? N  Difficulty concentrating or making decisions? N  Walking or climbing stairs? N  Dressing or bathing? N  Doing errands, shopping? N  Preparing Food and eating ? N  Using the Toilet? N  In the past six months, have you accidently leaked urine? N  Do you have problems with loss of bowel control? N  Managing your Medications? N  Managing your Finances? N  Housekeeping or managing your Housekeeping? N  Some recent data might be hidden    Patient Care Team: Pleas Koch, NP as PCP - General (Internal Medicine)  Indicate any recent Medical Services you may have received from other than [facility] providers in the past year (date may be approximate).     Assessment:   This is a routine wellness examination for [place].  Hearing/Vision screen  Hearing Screening   125Hz  250Hz  500Hz  1000Hz  2000Hz  3000Hz  4000Hz  6000Hz  8000Hz   Right ear:           Left ear:           Vision Screening Comments: Patient gets annual eye exams   Dietary issues and exercise activities discussed: Current Exercise Habits: The patient  does not participate in regular exercise at present, Exercise limited by: orthopedic condition(s)  Goals    . Patient Stated     05/21/2020, I will maintain and continue medications as prescribed.       Depression Screen PHQ 2/9 Scores 05/21/2020 04/29/2020  PHQ - 2 Score 0 0  PHQ- 9 Score 0 -    Fall Risk Fall Risk  05/21/2020 05/06/2020 04/29/2020 04/21/2020 10/30/2019  Falls in the past year? 0 0 0 1 1  Number falls in past yr: 0 - - - 0  Injury with Fall? 0 - - - 0  Risk for fall due to : Medication side effect - - - -  Follow up Falls evaluation completed;Falls prevention discussed - - - Falls evaluation completed    Any stairs in or around the home? Yes  If so, are there any without handrails? No  Home free of loose throw rugs in walkways, pet beds, electrical cords, etc? Yes  Adequate lighting in your home to reduce risk of falls? Yes   ASSISTIVE DEVICES UTILIZED TO PREVENT FALLS:  Life alert? No  Use of a cane, walker or w/c? No  Grab bars in the bathroom? No  Shower chair or bench in shower? No  Elevated toilet seat or a handicapped toilet? No   TIMED UP AND GO:  Was the test performed? N/A, telephonic visit .    Cognitive Function: MMSE - Mini Mental State Exam 05/21/2020  Orientation to time 5  Orientation to Place 5  Registration 3  Attention/ Calculation 5  Recall 3  Language- repeat 1       Mini Cog  Mini-Cog screen was completed. Maximum score is 22. A value of 0 denotes this part of the MMSE was not completed or the patient failed this part of the Mini-Cog screening.  Immunizations Immunization History  Administered Date(s) Administered  . Influenza, Seasonal, Injecte, Preservative Fre 09/15/2011  . PPD Test 09/16/2015, 09/16/2015  . Pneumococcal Conjugate-13 12/22/2016  . Pneumococcal Polysaccharide-23 11/29/2011  . Tdap 07/16/2018    TDAP status: Up to date Flu Vaccine status: due  Fall 2021 Pneumococcal vaccine status: Up to date Covid-19  vaccine status: Completed vaccines per patient. Will bring record to next office visit.   Qualifies for Shingles Vaccine? Yes   Zostavax completed No   Shingrix Completed?: No.    Education has been provided regarding the importance of this vaccine. Patient has been advised to call insurance company to determine out of pocket expense if they have not yet received this vaccine. Advised may also receive vaccine at local pharmacy or Health Dept. Verbalized acceptance and understanding.  Screening Tests Health Maintenance  Topic Date Due  . Hepatitis C Screening  Never done  . COVID-19 Vaccine (1) Never done  . COLONOSCOPY  Never done  . INFLUENZA VACCINE  06/28/2020  . TETANUS/TDAP  07/16/2028  . DEXA SCAN  Completed  . PNA vac Low Risk Adult  Completed    Health Maintenance  Health Maintenance Due  Topic Date Due  . Hepatitis C Screening  Never done  . COVID-19 Vaccine (1) Never done  . COLONOSCOPY  Never done    Colorectal cancer screening: No longer required.  per patient. [facility] one done [facility] a few years ago. Mammogram status: No longer required. per patient Bone Density status: Completed 06/2019. Results reflect: Bone density results: NORMAL. Repeat every 5 years. per patient  Lung Cancer Screening: (Low Dose CT Chest recommended if Age 20-80 years, 30 pack-year currently smoking OR have quit w/in 15years.) does not qualify.     Additional Screening:  Hepatitis C Screening: does qualify; Completed due  Vision Screening: Recommended annual ophthalmology exams for early detection of glaucoma and other disorders of the eye. Is the patient up to date with their annual eye exam?  Yes  Who is the provider or what is the name of the office in which the patient attends annual eye exams? Dr. Alvester Morin  If pt is not established with a provider, would they like to be referred to a provider to establish care? No .   Dental Screening: Recommended annual dental exams for proper oral  hygiene  Community Resource Referral / Chronic Care Management: CRR required this visit?  No   CCM required this visit?  No      Plan:     I have personally reviewed and noted the following in the patient's chart:   . Medical and social history . Use of alcohol, tobacco or illicit drugs  . Current medications and supplements . Functional ability and status . Nutritional status . Physical activity . Advanced directives . List of other physicians . Hospitalizations, surgeries, and ER visits in previous 12 months . Vitals . Screenings to include cognitive, depression, and falls . Referrals and appointments  In addition, I have reviewed and discussed with patient certain preventive protocols, quality metrics, and best practice recommendations. A written personalized care plan for preventive services as well as general preventive health recommendations were provided to patient.   Due to this being a telephonic visit, the after visit summary with patients personalized plan was offered to patient via mail or my-chart. Patient preferred to pick up at office at next visit.  Janalyn Shy, LPN   1/61/0960

## 2020-05-26 ENCOUNTER — Encounter: Payer: Self-pay | Admitting: Student in an Organized Health Care Education/Training Program

## 2020-05-27 ENCOUNTER — Other Ambulatory Visit: Payer: Self-pay

## 2020-05-27 ENCOUNTER — Ambulatory Visit
Payer: Medicare HMO | Attending: Student in an Organized Health Care Education/Training Program | Admitting: Student in an Organized Health Care Education/Training Program

## 2020-05-27 ENCOUNTER — Encounter: Payer: Self-pay | Admitting: Student in an Organized Health Care Education/Training Program

## 2020-05-27 DIAGNOSIS — G894 Chronic pain syndrome: Secondary | ICD-10-CM | POA: Diagnosis not present

## 2020-05-27 DIAGNOSIS — M48062 Spinal stenosis, lumbar region with neurogenic claudication: Secondary | ICD-10-CM | POA: Diagnosis not present

## 2020-05-27 DIAGNOSIS — M47816 Spondylosis without myelopathy or radiculopathy, lumbar region: Secondary | ICD-10-CM

## 2020-05-27 DIAGNOSIS — M5416 Radiculopathy, lumbar region: Secondary | ICD-10-CM

## 2020-05-27 DIAGNOSIS — G8929 Other chronic pain: Secondary | ICD-10-CM | POA: Diagnosis not present

## 2020-05-27 NOTE — Progress Notes (Signed)
Casey Casey [name]: Casey Casey [name]  Service Category: E/M  Provider: Gillis Santa, MD  DOB: 1945/12/01  DOS: 05/27/2020  Location: Office  MRN: [id number]  Setting: Ambulatory outpatient  Referring Provider: Pleas Koch, NP  Type: Established Casey Casey [name]  Specialty: Interventional Pain Management  PCP: Pleas Koch, NP  Location: Home  Delivery: TeleHealth     Virtual Encounter - Pain Management PROVIDER NOTE: Information contained herein reflects review and annotations entered in association with encounter. Interpretation of Casey [name] information and data should be left to medically-trained personnel. Information provided to Casey Casey [name] can be located elsewhere in the medical record under "Casey Casey [name] Instructions". Document created using STT-dictation technology, any transcriptional errors that may result from process are unintentional.    Contact & Pharmacy Preferred: [phone] Home: [phone] (home) Mobile: [phone] (mobile) E-mail: [email or web address]  [id number] [id number]: [phone]: [phone] [id number] [id number]: [phone]: [phone] Pre-screening  Casey Casey [name] offered "in-person" vs "virtual" encounter. She indicated preferring virtual for this encounter.   Reason COVID-19*  Social distancing based on CDC and AMA recommendations.   I contacted Casey Casey [name] 05/27/2020 via televisit.      I clearly identified myself as BiGillis SantaMD. I verified that I was speaking with the correct person using two identifiers (Name: Casey Casey [name] date of birth: 08/02/27/1947  Consent I sought verbal advanced consent from Casey Fabianor virtual visit interactions. I informed Casey Casey [name] of possible security and privacy concerns, risks, and limitations associated with providing "not-in-person" medical evaluation  and management services. I also informed Casey Casey [name] of the availability of "in-person" appointments. Finally, I informed her that there would be a charge for the virtual visit and that she could be  personally, fully or partially, financially responsible for it. Casey Casey [name] understanding and agreed to proceed.   Historic Elements   Casey Casey [name] a [age].o. year old, female Casey Casey [name] evaluated today after her last contact with our practice on 05/20/2020. Casey Casey [name] a past medical history of Allergy, Chronic back pain, Osteoporosis, and Rheumatoid arthritis [place] She also  has a past surgical history that includes Epidural block injection and Neck surgery. Casey Casey [name] a current medication list which includes the following prescription(s): acetaminophen, adalimumab, alendronate, medical compression stockings, etodolac, folic acid, lidocaine, prednisone, teriparatide (recombinant), tizanidine, and tramadol. She  reports that she has never smoked. She has never used smokeless tobacco. She reports that she does not drink alcohol and does not use drugs. Casey Casey [name] allergic to leflunomide and methotrexate.   HPI  Today, she is being contacted for a post-procedure assessment.  Unfortunately, the Casey Casey [name] did not receive any significant benefit after her caudal epidural steroid injection performed earlier this month.  Given lack of benefit with medication and injections, Casey Casey [name]'s L4-L5 lumbar spine surgery has been moved up to June 17, 2020.   Laboratory Chemistry Profile   Renal Lab Results  Component Value Date   BUN 15 04/04/2020   CREATININE 0.79 04/04/2020   GFRAA >60 04/04/2020   GFRNONAA >60 04/04/2020     Hepatic Lab Results  Component Value Date   AST 25 04/04/2020   ALT 13 04/04/2020   ALBUMIN 4.4 04/04/2020   ALKPHOS 93 04/04/2020     Electrolytes Lab Results  Component Value Date  NA 141 04/04/2020   K 4.0 04/04/2020   CL 105 04/04/2020   CALCIUM 9.7  04/04/2020     Bone No results found for: VD25OH, VD125OH2TOT, ET0159ZO8, XL7022YU6, 25OHVITD1, 25OHVITD2, 25OHVITD3, TESTOFREE, TESTOSTERONE   Inflammation (CRP: Acute Phase) (ESR: Chronic Phase) No results found for: CRP, ESRSEDRATE, LATICACIDVEN     Note: Above Lab results reviewed.   Imaging  Pulmonary Function Test [facility] Only Spirometry Data Is Acceptable and Reproducible  Mild Obstructive Airways Disease   Consider outpatient Pulmonary Consultation if needed Clinical Correlation Advised  Assessment  The primary encounter diagnosis was Chronic radicular lumbar pain. Diagnoses of Lumbar radiculopathy, Lumbar facet arthropathy, Spinal stenosis, lumbar region, with neurogenic claudication, and Chronic pain syndrome were also pertinent to this visit.  Plan of Care   No follow-up needed, proceed with surgery as scheduled.  No benefit with gabapentin or Lyrica.  Casey Casey [name] not interested in trying Cymbalta.  Follow-up plan:   Return for no follow up needed.   ate: 03/19/2020; Time: 10:25 AM    Recent Visits Date Type Provider Dept  05/06/20 Office Visit Gillis Santa, MD [facility]  04/29/20 Procedure visit Gillis Santa, MD [facility]  04/21/20 Office Visit Gillis Santa, MD [facility]  04/02/20 Telemedicine Gillis Santa, MD [facility]  03/19/20 Telemedicine Gillis Santa, MD [facility]  Showing recent visits within past 90 days and meeting all other requirements Today's Visits Date Type Provider Dept  05/27/20 Telemedicine Gillis Santa, MD [facility]  Showing today's visits and meeting all other requirements Future Appointments No visits were found meeting these conditions. Showing future appointments within next 90 days and meeting all other requirements  I discussed the assessment and treatment plan with the Casey Casey [name]. The Casey Casey [name] was provided an opportunity to ask questions and all were answered. The  Casey Casey [name] agreed with the plan and demonstrated an understanding of the instructions.  Casey Casey [name] advised to call back or seek an in-person evaluation if the symptoms or condition worsens.  Duration of encounter:10 minutes.  Note by: Gillis Santa, MD Date: 05/27/2020; Time: 3:21 PM

## 2020-05-29 DIAGNOSIS — M5416 Radiculopathy, lumbar region: Secondary | ICD-10-CM | POA: Diagnosis not present

## 2020-06-04 DIAGNOSIS — M5416 Radiculopathy, lumbar region: Secondary | ICD-10-CM | POA: Diagnosis not present

## 2020-06-04 DIAGNOSIS — M4310 Spondylolisthesis, site unspecified: Secondary | ICD-10-CM | POA: Diagnosis not present

## 2020-06-05 DIAGNOSIS — M810 Age-related osteoporosis without current pathological fracture: Secondary | ICD-10-CM | POA: Diagnosis not present

## 2020-06-05 DIAGNOSIS — Z7952 Long term (current) use of systemic steroids: Secondary | ICD-10-CM | POA: Diagnosis not present

## 2020-06-05 DIAGNOSIS — G3184 Mild cognitive impairment, so stated: Secondary | ICD-10-CM | POA: Diagnosis not present

## 2020-06-05 DIAGNOSIS — G8929 Other chronic pain: Secondary | ICD-10-CM | POA: Diagnosis not present

## 2020-06-05 DIAGNOSIS — K08409 Partial loss of teeth, unspecified cause, unspecified class: Secondary | ICD-10-CM | POA: Diagnosis not present

## 2020-06-05 DIAGNOSIS — R030 Elevated blood-pressure reading, without diagnosis of hypertension: Secondary | ICD-10-CM | POA: Diagnosis not present

## 2020-06-05 DIAGNOSIS — Z7983 Long term (current) use of bisphosphonates: Secondary | ICD-10-CM | POA: Diagnosis not present

## 2020-06-05 DIAGNOSIS — M069 Rheumatoid arthritis, unspecified: Secondary | ICD-10-CM | POA: Diagnosis not present

## 2020-06-05 DIAGNOSIS — M4800 Spinal stenosis, site unspecified: Secondary | ICD-10-CM | POA: Diagnosis not present

## 2020-06-05 DIAGNOSIS — M1990 Unspecified osteoarthritis, unspecified site: Secondary | ICD-10-CM | POA: Diagnosis not present

## 2020-06-11 ENCOUNTER — Other Ambulatory Visit: Payer: Medicare HMO

## 2020-06-11 ENCOUNTER — Other Ambulatory Visit: Payer: Self-pay

## 2020-06-11 ENCOUNTER — Encounter
Admission: RE | Admit: 2020-06-11 | Discharge: 2020-06-11 | Disposition: A | Discharge status: Home / Self Care | Payer: Medicare HMO | Source: Ambulatory Visit | Attending: Neurosurgery | Admitting: Neurosurgery

## 2020-06-11 DIAGNOSIS — R001 Bradycardia, unspecified: Secondary | ICD-10-CM | POA: Insufficient documentation

## 2020-06-11 DIAGNOSIS — Z01818 Encounter for other preprocedural examination: Secondary | ICD-10-CM | POA: Diagnosis not present

## 2020-06-11 DIAGNOSIS — Z01810 Encounter for preprocedural cardiovascular examination: Secondary | ICD-10-CM | POA: Diagnosis not present

## 2020-06-11 HISTORY — DX: Personal history of urinary calculi: Z87.442

## 2020-06-11 LAB — BASIC METABOLIC PANEL
Anion gap: 8 (ref 5–15)
BUN: 19 mg/dL (ref 8–23)
CO2: 27 mmol/L (ref 22–32)
Calcium: 9.2 mg/dL (ref 8.9–10.3)
Chloride: 107 mmol/L (ref 98–111)
Creatinine, Ser: 0.85 mg/dL (ref 0.44–1.00)
GFR calc Af Amer: >60 mL/min (ref >60)
GFR calc non Af Amer: >60 mL/min (ref >60)
Glucose, Bld: 88 mg/dL (ref 70–99)
Potassium: 4 mmol/L (ref 3.5–5.1)
Sodium: 142 mmol/L (ref 135–145)

## 2020-06-11 LAB — APTT: aPTT: 26 seconds (ref 24–36)

## 2020-06-11 LAB — CBC
HCT: 36.6 % (ref 36.0–46.0)
Hemoglobin: 12.7 g/dL (ref 12.0–15.0)
MCH: 30.5 pg (ref 26.0–34.0)
MCHC: 34.7 g/dL (ref 30.0–36.0)
MCV: 87.8 fL (ref 80.0–100.0)
Platelets: 234 10*3/uL (ref 150–400)
RBC: 4.17 MIL/uL (ref 3.87–5.11)
RDW: 12.8 % (ref 11.5–15.5)
WBC: 6.9 10*3/uL (ref 4.0–10.5)
nRBC: 0 % (ref 0.0–0.2)

## 2020-06-11 LAB — URINALYSIS, ROUTINE W REFLEX MICROSCOPIC
Bacteria, UA: NONE SEEN
Bilirubin Urine: NEGATIVE
Glucose, UA: NEGATIVE mg/dL
Hgb urine dipstick: NEGATIVE
Ketones, ur: NEGATIVE mg/dL
Nitrite: NEGATIVE
Protein, ur: NEGATIVE mg/dL
Specific Gravity, Urine: 1.016 (ref 1.005–1.030)
pH: 6 (ref 5.0–8.0)

## 2020-06-11 LAB — TYPE AND SCREEN
ABO/RH(D): O POS
Antibody Screen: NEGATIVE

## 2020-06-11 LAB — SURGICAL PCR SCREEN
MRSA, PCR: NEGATIVE
Staphylococcus aureus: NEGATIVE

## 2020-06-11 LAB — PROTIME-INR
INR: 1 (ref 0.8–1.2)
Prothrombin Time: 12.6 seconds (ref 11.4–15.2)

## 2020-06-11 NOTE — Patient Instructions (Signed)
Your procedure is scheduled on: Wed. 7/21 Report to Day Surgery. To find out your arrival time please call [phone] between 1PM - 3PM on Tues 7/20.  Remember: Instructions that are not followed completely may result in serious medical risk,  up to and including death, or upon the discretion of your surgeon and anesthesiologist your  surgery may need to be rescheduled.     _X__ 1. Do not eat food after midnight the night before your procedure.                 No gum chewing or hard candies. You may drink clear liquids up to 2 hours                 before you are scheduled to arrive for your surgery- DO not drink clear                 liquids within 2 hours of the start of your surgery.                 Clear Liquids include:  water, apple juice without pulp, clear Gatorade, G2 or                  Gatorade Zero (avoid Red/Purple/Blue), Black Coffee or Tea (Do not add                 anything to coffee or tea). _____2.   Complete the carbohydrate drink provided to you, 2 hours before arrival.  __X__2.  On the morning of surgery brush your teeth with toothpaste and water, you                may rinse your mouth with mouthwash if you wish.  Do not swallow any toothpaste of mouthwash.     _X__ 3.  No Alcohol for 24 hours before or after surgery.   ___ 4.  Do Not Smoke or use e-cigarettes For 24 Hours Prior to Your Surgery.                 Do not use any chewable tobacco products for at least 6 hours prior to                 Surgery.  ___  5.  Do not use any recreational drugs (marijuana, cocaine, heroin, ecstacy, MDMA or other)                For at least one week prior to your surgery.  Combination of these drugs with anesthesia                May have life threatening results.  ____  6.  Bring all medications with you on the day of surgery if instructed.   _x___  7.  Notify your doctor if there is any change in your medical condition      (cold, fever,  infections).     Do not wear jewelry, make-up, hairpins, clips or nail polish. Do not wear lotions, powders, or perfumes. You may wear deodorant. Do not shave 48 hours prior to surgery. Men may shave face and neck. Do not bring valuables to the hospital.    [facility] is not responsible for any belongings or valuables.  Contacts, dentures or bridgework may not be worn into surgery. Leave your suitcase in the car. After surgery it may be brought to your room. For patients admitted to the hospital, discharge time is determined by your treatment  team.   Patients discharged the day of surgery will not be allowed to drive home.   Make arrangements for someone to be with you for the first 24 hours of your Same Day Discharge.    Please read over the following fact sheets that you were given:   __x__ Take these medicines the morning of surgery with A SIP OF WATER:    1. acetaminophen (TYLENOL) 500 MG tablet if needed  2. pregabalin (LYRICA) 50 MG capsule  3. predniSONE (DELTASONE) 5 MG tablet  4.tiZANidine (ZANAFLEX) 4 MG tablet if needed  5.  6.  ____ Fleet Enema (as directed)   __x__ Use CHG Soap (or wipes) as directed  ____ Use Benzoyl Peroxide Gel as instructed  ____ Use inhalers on the day of surgery  ____ Stop metformin 2 days prior to surgery    ____ Take 1/2 of usual insulin dose the night before surgery. No insulin the morning          of surgery.   ____ Stop Coumadin/Plavix/aspirin on   _x___ Stop Anti-inflammatories etodolac (LODINE) 400 MG tablet, ibuprofen aleve or aspirin   May continue tylenol   __x__ Stop supplements until after surgery.  Ascorbic Acid (VITAMIN C) 1000 MG tablet, ELDERBERRY PO, Melatonin 10 MG TABS,Omega-3 Fatty Acids (FISH OIL) 1200 MG CAPS,   ____ Bring C-Pap to the hospital.

## 2020-06-15 ENCOUNTER — Other Ambulatory Visit
Admission: RE | Admit: 2020-06-15 | Discharge: 2020-06-15 | Disposition: A | Discharge status: Home / Self Care | Payer: Medicare HMO | Source: Ambulatory Visit | Attending: Neurosurgery | Admitting: Neurosurgery

## 2020-06-15 ENCOUNTER — Other Ambulatory Visit: Payer: Self-pay

## 2020-06-15 DIAGNOSIS — Z01812 Encounter for preprocedural laboratory examination: Secondary | ICD-10-CM | POA: Insufficient documentation

## 2020-06-15 DIAGNOSIS — Z20822 Contact with and (suspected) exposure to covid-19: Secondary | ICD-10-CM | POA: Insufficient documentation

## 2020-06-15 LAB — SARS CORONAVIRUS 2 (TAT 6-24 HRS): SARS Coronavirus 2: NEGATIVE

## 2020-06-17 ENCOUNTER — Inpatient Hospital Stay
Admission: RE | Admit: 2020-06-17 | Discharge: 2020-06-19 | DRG: 455 | Disposition: A | Discharge status: Home / Self Care | Payer: Medicare HMO | Attending: Neurosurgery | Admitting: Neurosurgery

## 2020-06-17 ENCOUNTER — Inpatient Hospital Stay: Payer: Medicare HMO | Admitting: Anesthesiology

## 2020-06-17 ENCOUNTER — Encounter: Payer: Self-pay | Admitting: Neurosurgery

## 2020-06-17 ENCOUNTER — Inpatient Hospital Stay: Payer: Medicare HMO

## 2020-06-17 ENCOUNTER — Other Ambulatory Visit: Payer: Self-pay

## 2020-06-17 ENCOUNTER — Encounter
Admission: RE | Disposition: A | Discharge status: Home / Self Care | Payer: Self-pay | Source: Home / Self Care | Attending: Neurosurgery

## 2020-06-17 DIAGNOSIS — M4726 Other spondylosis with radiculopathy, lumbar region: Secondary | ICD-10-CM | POA: Diagnosis not present

## 2020-06-17 DIAGNOSIS — M4310 Spondylolisthesis, site unspecified: Secondary | ICD-10-CM | POA: Diagnosis not present

## 2020-06-17 DIAGNOSIS — M4316 Spondylolisthesis, lumbar region: Secondary | ICD-10-CM | POA: Diagnosis present

## 2020-06-17 DIAGNOSIS — M5416 Radiculopathy, lumbar region: Secondary | ICD-10-CM | POA: Diagnosis not present

## 2020-06-17 DIAGNOSIS — Z981 Arthrodesis status: Secondary | ICD-10-CM | POA: Diagnosis not present

## 2020-06-17 DIAGNOSIS — Z888 Allergy status to other drugs, medicaments and biological substances status: Secondary | ICD-10-CM | POA: Diagnosis not present

## 2020-06-17 DIAGNOSIS — M4326 Fusion of spine, lumbar region: Secondary | ICD-10-CM | POA: Diagnosis not present

## 2020-06-17 DIAGNOSIS — G894 Chronic pain syndrome: Secondary | ICD-10-CM | POA: Diagnosis not present

## 2020-06-17 DIAGNOSIS — M48062 Spinal stenosis, lumbar region with neurogenic claudication: Secondary | ICD-10-CM | POA: Diagnosis not present

## 2020-06-17 DIAGNOSIS — M069 Rheumatoid arthritis, unspecified: Secondary | ICD-10-CM | POA: Diagnosis present

## 2020-06-17 DIAGNOSIS — Z20822 Contact with and (suspected) exposure to covid-19: Secondary | ICD-10-CM | POA: Diagnosis present

## 2020-06-17 DIAGNOSIS — Z419 Encounter for procedure for purposes other than remedying health state, unspecified: Secondary | ICD-10-CM

## 2020-06-17 HISTORY — PX: LUMBAR LAMINECTOMY/DECOMPRESSION MICRODISCECTOMY: SHX5026

## 2020-06-17 HISTORY — DX: Spondylolisthesis, site unspecified: M43.10

## 2020-06-17 HISTORY — PX: ANTERIOR LATERAL LUMBAR FUSION WITH PERCUTANEOUS SCREW 1 LEVEL: SHX5553

## 2020-06-17 LAB — ABO/RH: ABO/RH(D): O POS

## 2020-06-17 SURGERY — ANTERIOR LATERAL LUMBAR FUSION WITH PERCUTANEOUS SCREW 1 LEVEL
Anesthesia: General | Laterality: Right

## 2020-06-17 MED ORDER — PROPOFOL 500 MG/50ML IV EMUL
INTRAVENOUS | Status: DC | PRN
Start: 2020-06-17 — End: 2020-06-17
  Administered 2020-06-17: 125 ug/kg/min via INTRAVENOUS

## 2020-06-17 MED ORDER — SUCCINYLCHOLINE CHLORIDE 200 MG/10ML IV SOSY
PREFILLED_SYRINGE | INTRAVENOUS | Status: AC
  Filled 2020-06-17: qty 10

## 2020-06-17 MED ORDER — MENTHOL 3 MG MT LOZG
1.0000 | LOZENGE | OROMUCOSAL | Status: DC | PRN
  Filled 2020-06-17: qty 9

## 2020-06-17 MED ORDER — OXYCODONE HCL 5 MG PO TABS: 5.0000 mg | ORAL_TABLET | ORAL | Status: DC | PRN

## 2020-06-17 MED ORDER — CEFAZOLIN SODIUM-DEXTROSE 2-4 GM/100ML-% IV SOLN
INTRAVENOUS | Status: AC
  Filled 2020-06-17: qty 100

## 2020-06-17 MED ORDER — GLYCOPYRROLATE 0.2 MG/ML IJ SOLN
INTRAMUSCULAR | Status: AC
  Filled 2020-06-17: qty 1

## 2020-06-17 MED ORDER — CHLORHEXIDINE GLUCONATE 0.12 % MT SOLN
OROMUCOSAL | Status: AC
  Administered 2020-06-17: 15 mL via OROMUCOSAL
  Filled 2020-06-17: qty 15

## 2020-06-17 MED ORDER — ONDANSETRON HCL 4 MG/2ML IJ SOLN: 4.0000 mg | Freq: Four times a day (QID) | INTRAMUSCULAR | Status: DC | PRN

## 2020-06-17 MED ORDER — VANCOMYCIN HCL IN DEXTROSE 1-5 GM/200ML-% IV SOLN: 1000.0000 mg | Freq: Once | INTRAVENOUS | Status: AC

## 2020-06-17 MED ORDER — FAMOTIDINE 20 MG PO TABS: 20.0000 mg | ORAL_TABLET | Freq: Once | ORAL | Status: AC

## 2020-06-17 MED ORDER — LIDOCAINE HCL (PF) 2 % IJ SOLN
INTRAMUSCULAR | Status: AC
  Filled 2020-06-17: qty 5

## 2020-06-17 MED ORDER — SODIUM CHLORIDE 0.9 % IV SOLN
250.0000 mL | INTRAVENOUS | Status: DC
  Administered 2020-06-17: 250 mL via INTRAVENOUS

## 2020-06-17 MED ORDER — GABAPENTIN 100 MG PO CAPS
100.0000 mg | ORAL_CAPSULE | Freq: Three times a day (TID) | ORAL | Status: DC
  Administered 2020-06-17 – 2020-06-18 (×2): 100 mg via ORAL
  Filled 2020-06-17 (×2): qty 1

## 2020-06-17 MED ORDER — OXYCODONE HCL 5 MG/5ML PO SOLN: 5.0000 mg | Freq: Once | ORAL | Status: DC | PRN

## 2020-06-17 MED ORDER — PHENOL 1.4 % MT LIQD
1.0000 | OROMUCOSAL | Status: DC | PRN
  Filled 2020-06-17: qty 177

## 2020-06-17 MED ORDER — ACETAMINOPHEN 500 MG PO TABS
1000.0000 mg | ORAL_TABLET | Freq: Four times a day (QID) | ORAL | Status: AC
  Administered 2020-06-17 – 2020-06-18 (×4): 1000 mg via ORAL
  Filled 2020-06-17 (×4): qty 2

## 2020-06-17 MED ORDER — HYDRALAZINE HCL 20 MG/ML IJ SOLN
10.0000 mg | Freq: Four times a day (QID) | INTRAMUSCULAR | Status: DC | PRN
  Administered 2020-06-17: 10 mg via INTRAVENOUS
  Filled 2020-06-17 (×2): qty 0.5

## 2020-06-17 MED ORDER — OXYCODONE HCL 5 MG PO TABS: 5.0000 mg | ORAL_TABLET | Freq: Once | ORAL | Status: DC | PRN

## 2020-06-17 MED ORDER — CHLORHEXIDINE GLUCONATE 0.12 % MT SOLN: 15.0000 mL | Freq: Once | OROMUCOSAL | Status: AC

## 2020-06-17 MED ORDER — PROPOFOL 500 MG/50ML IV EMUL
INTRAVENOUS | Status: AC
  Filled 2020-06-17: qty 50

## 2020-06-17 MED ORDER — FENTANYL CITRATE (PF) 100 MCG/2ML IJ SOLN
25.0000 ug | INTRAMUSCULAR | Status: DC | PRN
  Administered 2020-06-17 (×2): 50 ug via INTRAVENOUS

## 2020-06-17 MED ORDER — SUCCINYLCHOLINE CHLORIDE 20 MG/ML IJ SOLN
INTRAMUSCULAR | Status: DC | PRN
  Administered 2020-06-17: 100 mg via INTRAVENOUS

## 2020-06-17 MED ORDER — ORAL CARE MOUTH RINSE: 15.0000 mL | Freq: Once | OROMUCOSAL | Status: AC

## 2020-06-17 MED ORDER — FLEET ENEMA 7-19 GM/118ML RE ENEM: 1.0000 | ENEMA | Freq: Once | RECTAL | Status: DC | PRN

## 2020-06-17 MED ORDER — VANCOMYCIN HCL IN DEXTROSE 1-5 GM/200ML-% IV SOLN
INTRAVENOUS | Status: AC
  Administered 2020-06-17: 1000 mg via INTRAVENOUS
  Filled 2020-06-17: qty 200

## 2020-06-17 MED ORDER — SODIUM CHLORIDE 0.9 % IV SOLN
INTRAVENOUS | Status: DC | PRN
  Administered 2020-06-17: 40 mL

## 2020-06-17 MED ORDER — SODIUM CHLORIDE 0.9% FLUSH
3.0000 mL | Freq: Two times a day (BID) | INTRAVENOUS | Status: DC
  Administered 2020-06-17 – 2020-06-18 (×3): 3 mL via INTRAVENOUS

## 2020-06-17 MED ORDER — METHOCARBAMOL 1000 MG/10ML IJ SOLN
500.0000 mg | Freq: Four times a day (QID) | INTRAVENOUS | Status: DC | PRN
  Administered 2020-06-17: 500 mg via INTRAVENOUS
  Filled 2020-06-17: qty 5

## 2020-06-17 MED ORDER — BISACODYL 10 MG RE SUPP: 10.0000 mg | Freq: Every day | RECTAL | Status: DC | PRN

## 2020-06-17 MED ORDER — DEXAMETHASONE SODIUM PHOSPHATE 10 MG/ML IJ SOLN
INTRAMUSCULAR | Status: AC
  Filled 2020-06-17: qty 1

## 2020-06-17 MED ORDER — REMIFENTANIL HCL 1 MG IV SOLR
INTRAVENOUS | Status: AC
  Filled 2020-06-17: qty 1000

## 2020-06-17 MED ORDER — KETOROLAC TROMETHAMINE 15 MG/ML IJ SOLN
15.0000 mg | Freq: Four times a day (QID) | INTRAMUSCULAR | Status: AC
  Administered 2020-06-17 – 2020-06-18 (×4): 15 mg via INTRAVENOUS
  Filled 2020-06-17 (×4): qty 1

## 2020-06-17 MED ORDER — ONDANSETRON HCL 4 MG PO TABS: 4.0000 mg | ORAL_TABLET | Freq: Four times a day (QID) | ORAL | Status: DC | PRN

## 2020-06-17 MED ORDER — EPHEDRINE SULFATE 50 MG/ML IJ SOLN
INTRAMUSCULAR | Status: DC | PRN
  Administered 2020-06-17: 10 mg via INTRAVENOUS

## 2020-06-17 MED ORDER — ONDANSETRON HCL 4 MG/2ML IJ SOLN
INTRAMUSCULAR | Status: AC
  Filled 2020-06-17: qty 2

## 2020-06-17 MED ORDER — PROPOFOL 10 MG/ML IV BOLUS
INTRAVENOUS | Status: DC | PRN
Start: 2020-06-17 — End: 2020-06-17
  Administered 2020-06-17: 100 ug via INTRAVENOUS

## 2020-06-17 MED ORDER — CEFAZOLIN SODIUM-DEXTROSE 2-4 GM/100ML-% IV SOLN: 2.0000 g | Freq: Once | INTRAVENOUS | Status: DC

## 2020-06-17 MED ORDER — SODIUM CHLORIDE 0.9% FLUSH
3.0000 mL | INTRAVENOUS | Status: DC | PRN
  Administered 2020-06-17 – 2020-06-18 (×3): 3 mL via INTRAVENOUS

## 2020-06-17 MED ORDER — EPHEDRINE 5 MG/ML INJ
INTRAVENOUS | Status: AC
  Filled 2020-06-17: qty 10

## 2020-06-17 MED ORDER — ROCURONIUM BROMIDE 10 MG/ML (PF) SYRINGE
PREFILLED_SYRINGE | INTRAVENOUS | Status: AC
  Filled 2020-06-17: qty 10

## 2020-06-17 MED ORDER — PREGABALIN 50 MG PO CAPS
100.0000 mg | ORAL_CAPSULE | Freq: Two times a day (BID) | ORAL | Status: DC
  Administered 2020-06-17 – 2020-06-19 (×4): 100 mg via ORAL
  Filled 2020-06-17 (×4): qty 2

## 2020-06-17 MED ORDER — LACTATED RINGERS IV SOLN: INTRAVENOUS | Status: DC | PRN

## 2020-06-17 MED ORDER — FENTANYL CITRATE (PF) 100 MCG/2ML IJ SOLN
INTRAMUSCULAR | Status: AC
  Administered 2020-06-17: 50 ug via INTRAVENOUS
  Filled 2020-06-17: qty 2

## 2020-06-17 MED ORDER — ROCURONIUM BROMIDE 100 MG/10ML IV SOLN
INTRAVENOUS | Status: DC | PRN
  Administered 2020-06-17: 10 mg via INTRAVENOUS

## 2020-06-17 MED ORDER — OXYCODONE HCL 5 MG PO TABS: 10.0000 mg | ORAL_TABLET | ORAL | Status: DC | PRN

## 2020-06-17 MED ORDER — METHOCARBAMOL 500 MG PO TABS: 500.0000 mg | ORAL_TABLET | Freq: Four times a day (QID) | ORAL | Status: DC | PRN

## 2020-06-17 MED ORDER — HYDROMORPHONE HCL 1 MG/ML IJ SOLN: 0.5000 mg | INTRAMUSCULAR | Status: DC | PRN

## 2020-06-17 MED ORDER — BUPIVACAINE-EPINEPHRINE (PF) 0.5% -1:200000 IJ SOLN
INTRAMUSCULAR | Status: DC | PRN
  Administered 2020-06-17: 8 mL
  Administered 2020-06-17: 5 mL

## 2020-06-17 MED ORDER — DEXAMETHASONE SODIUM PHOSPHATE 10 MG/ML IJ SOLN
INTRAMUSCULAR | Status: DC | PRN
  Administered 2020-06-17: 10 mg via INTRAVENOUS

## 2020-06-17 MED ORDER — REMIFENTANIL HCL 1 MG IV SOLR
INTRAVENOUS | Status: DC | PRN
  Administered 2020-06-17: .1 ug/kg/min via INTRAVENOUS

## 2020-06-17 MED ORDER — ONDANSETRON HCL 4 MG/2ML IJ SOLN
INTRAMUSCULAR | Status: DC | PRN
  Administered 2020-06-17: 4 mg via INTRAVENOUS

## 2020-06-17 MED ORDER — LIDOCAINE HCL (CARDIAC) PF 100 MG/5ML IV SOSY
PREFILLED_SYRINGE | INTRAVENOUS | Status: DC | PRN
  Administered 2020-06-17: 60 mg via INTRAVENOUS

## 2020-06-17 MED ORDER — HYDROMORPHONE HCL 1 MG/ML IJ SOLN
INTRAMUSCULAR | Status: AC
  Administered 2020-06-17: 0.5 mg via INTRAVENOUS
  Filled 2020-06-17: qty 1

## 2020-06-17 MED ORDER — THROMBIN 5000 UNITS EX SOLR
CUTANEOUS | Status: DC | PRN
  Administered 2020-06-17: 5000 [IU] via TOPICAL

## 2020-06-17 MED ORDER — SODIUM CHLORIDE 0.9 % IR SOLN
Status: DC | PRN
  Administered 2020-06-17: 1000 mL

## 2020-06-17 MED ORDER — FENTANYL CITRATE (PF) 100 MCG/2ML IJ SOLN
INTRAMUSCULAR | Status: DC | PRN
  Administered 2020-06-17: 25 ug via INTRAVENOUS
  Administered 2020-06-17: 50 ug via INTRAVENOUS

## 2020-06-17 MED ORDER — BUPIVACAINE HCL (PF) 0.5 % IJ SOLN
INTRAMUSCULAR | Status: DC | PRN
  Administered 2020-06-17: 20 mL

## 2020-06-17 MED ORDER — FENTANYL CITRATE (PF) 100 MCG/2ML IJ SOLN
INTRAMUSCULAR | Status: AC
  Filled 2020-06-17: qty 2

## 2020-06-17 MED ORDER — PROPOFOL 10 MG/ML IV BOLUS
INTRAVENOUS | Status: AC
  Filled 2020-06-17: qty 20

## 2020-06-17 MED ORDER — LACTATED RINGERS IV SOLN: INTRAVENOUS | Status: DC

## 2020-06-17 MED ORDER — POLYETHYLENE GLYCOL 3350 17 G PO PACK: 17.0000 g | PACK | Freq: Every day | ORAL | Status: DC | PRN

## 2020-06-17 MED ORDER — SODIUM CHLORIDE 0.9 % IV SOLN
INTRAVENOUS | Status: DC | PRN
  Administered 2020-06-17: 50 ug/min via INTRAVENOUS

## 2020-06-17 MED ORDER — FAMOTIDINE 20 MG PO TABS
ORAL_TABLET | ORAL | Status: AC
  Administered 2020-06-17: 20 mg via ORAL
  Filled 2020-06-17: qty 1

## 2020-06-17 MED ORDER — HYDROMORPHONE HCL 1 MG/ML IJ SOLN
0.5000 mg | INTRAMUSCULAR | Status: AC | PRN
  Administered 2020-06-17 (×2): 0.5 mg via INTRAVENOUS

## 2020-06-17 MED ORDER — SENNA 8.6 MG PO TABS
1.0000 | ORAL_TABLET | Freq: Two times a day (BID) | ORAL | Status: DC
  Administered 2020-06-17 – 2020-06-19 (×4): 8.6 mg via ORAL
  Filled 2020-06-17 (×4): qty 1

## 2020-06-17 SURGICAL SUPPLY — 93 items
BUR NEURO DRILL SOFT 3.0X3.8M (BURR) ×3 IMPLANT
CANISTER SUCT 1200ML W/VALVE (MISCELLANEOUS) ×6 IMPLANT
CAP LOCKING SPINE (Cap) ×12 IMPLANT
CHLORAPREP W/TINT 26 (MISCELLANEOUS) ×12 IMPLANT
CNTNR SPEC 2.5X3XGRAD LEK (MISCELLANEOUS) ×2
CONT SPEC 4OZ STER OR WHT (MISCELLANEOUS) ×1
CONTAINER SPEC 2.5X3XGRAD LEK (MISCELLANEOUS) ×2 IMPLANT
CORD BIP STRL DISP 12FT (MISCELLANEOUS) ×3 IMPLANT
CORD LIGHT LATERIAL X LIFT (MISCELLANEOUS) ×3 IMPLANT
COUNTER NEEDLE 20/40 LG (NEEDLE) ×3 IMPLANT
COVER BACK TABLE REUSABLE LG (DRAPES) ×3 IMPLANT
COVER LIGHT HANDLE STERIS (MISCELLANEOUS) ×12 IMPLANT
COVER WAND RF STERILE (DRAPES) ×3 IMPLANT
CUP MEDICINE 2OZ PLAST GRAD ST (MISCELLANEOUS) ×6 IMPLANT
DERMABOND ADVANCED (GAUZE/BANDAGES/DRESSINGS) ×3
DERMABOND ADVANCED .7 DNX12 (GAUZE/BANDAGES/DRESSINGS) ×6 IMPLANT
DRAPE C-ARM 42X72 X-RAY (DRAPES) ×6 IMPLANT
DRAPE C-ARMOR (DRAPES) ×6 IMPLANT
DRAPE INCISE IOBAN 66X45 STRL (DRAPES) ×6 IMPLANT
DRAPE LAPAROTOMY 100X77 ABD (DRAPES) ×6 IMPLANT
DRAPE MICROSCOPE SPINE 48X150 (DRAPES) ×3 IMPLANT
DRAPE POUCH INSTRU U-SHP 10X18 (DRAPES) IMPLANT
DRAPE SURG 17X11 SM STRL (DRAPES) ×24 IMPLANT
DRSG OPSITE POSTOP 3X4 (GAUZE/BANDAGES/DRESSINGS) ×3 IMPLANT
DRSG OPSITE POSTOP 4X6 (GAUZE/BANDAGES/DRESSINGS) ×3 IMPLANT
DRSG TEGADERM 2-3/8X2-3/4 SM (GAUZE/BANDAGES/DRESSINGS) IMPLANT
DRSG TEGADERM 4X4.75 (GAUZE/BANDAGES/DRESSINGS) IMPLANT
DRSG TEGADERM 6X8 (GAUZE/BANDAGES/DRESSINGS) IMPLANT
DRSG TELFA 3X8 NADH (GAUZE/BANDAGES/DRESSINGS) IMPLANT
DRSG TELFA 4X3 1S NADH ST (GAUZE/BANDAGES/DRESSINGS) IMPLANT
ELECT CAUTERY BLADE TIP 2.5 (TIP) ×6
ELECT EZSTD 165MM 6.5IN (MISCELLANEOUS) ×3
ELECT REM PT RETURN 9FT ADLT (ELECTROSURGICAL) ×6
ELECTRODE CAUTERY BLDE TIP 2.5 (TIP) ×4 IMPLANT
ELECTRODE EZSTD 165MM 6.5IN (MISCELLANEOUS) ×2 IMPLANT
ELECTRODE REM PT RTRN 9FT ADLT (ELECTROSURGICAL) ×4 IMPLANT
FEE INTRAOP MONITOR IMPULS NCS (MISCELLANEOUS) ×2 IMPLANT
FORCEPS BPLR BAYO 10IN 1.0TIP (INSTRUMENTS) ×3 IMPLANT
FRAME EYE SHIELD (PROTECTIVE WEAR) ×3 IMPLANT
GLOVE BIOGEL PI IND STRL 7.0 (GLOVE) ×4 IMPLANT
GLOVE BIOGEL PI INDICATOR 7.0 (GLOVE) ×2
GLOVE SURG SYN 7.0 (GLOVE) ×12 IMPLANT
GLOVE SURG SYN 8.5  E (GLOVE) ×6
GLOVE SURG SYN 8.5 E (GLOVE) ×12 IMPLANT
GOWN SRG XL LVL 3 NONREINFORCE (GOWNS) ×4 IMPLANT
GOWN STRL NON-REIN TWL XL LVL3 (GOWNS) ×2
GOWN STRL REUS W/ TWL XL LVL3 (GOWN DISPOSABLE) ×2 IMPLANT
GOWN STRL REUS W/TWL MED LVL3 (GOWN DISPOSABLE) IMPLANT
GOWN STRL REUS W/TWL XL LVL3 (GOWN DISPOSABLE) ×1
GRADUATE 1200CC STRL 31836 (MISCELLANEOUS) ×3 IMPLANT
INTRAOP MONITOR FEE IMPULS NCS (MISCELLANEOUS) ×2
INTRAOP MONITOR FEE IMPULSE (MISCELLANEOUS) ×1
K-WIRE 1.6 NITINOL SHARP TIP (WIRE) ×12
KIT DISP MARS 3V (KITS) ×3 IMPLANT
KIT INFUSE MEDIUM (Orthopedic Implant) ×3 IMPLANT
KIT PEDICLE ACCESS (KITS) ×3 IMPLANT
KIT SPINAL PRONEVIEW (KITS) ×3 IMPLANT
KIT TURNOVER KIT A (KITS) ×3 IMPLANT
KNIFE BAYONET SHORT DISCETOMY (MISCELLANEOUS) IMPLANT
KNIFE BOYONETTED ANNULOTOMY (MISCELLANEOUS) ×3 IMPLANT
KWIRE 1.6 NITINOL SHARP TIP (WIRE) ×8 IMPLANT
MARKER SKIN DUAL TIP RULER LAB (MISCELLANEOUS) ×9 IMPLANT
NDL SAFETY ECLIPSE 18X1.5 (NEEDLE) ×2 IMPLANT
NEEDLE HYPO 18GX1.5 SHARP (NEEDLE) ×1
NEEDLE HYPO 22GX1.5 SAFETY (NEEDLE) ×3 IMPLANT
NEEDLE I PASS (NEEDLE) ×3 IMPLANT
NS IRRIG 1000ML POUR BTL (IV SOLUTION) ×3 IMPLANT
PACK LAMINECTOMY NEURO (CUSTOM PROCEDURE TRAY) ×3 IMPLANT
PAD ARMBOARD 7.5X6 YLW CONV (MISCELLANEOUS) ×9 IMPLANT
PENCIL ELECTRO HAND CTR (MISCELLANEOUS) ×3 IMPLANT
PUTTY DBX 5CC (Putty) ×3 IMPLANT
ROD SPINE CURVE 5.5X50 (Rod) ×6 IMPLANT
SCREW CANN SPINE 6.5X45 (Screw) ×12 IMPLANT
SCREW CREO MIS 30 TULIP (Screw) ×12 IMPLANT
SPACER SPINE RISE-L 18X45 7-14 (Spacer) ×3 IMPLANT
SPOGE SURGIFLO 8M (HEMOSTASIS) ×1
SPONGE GAUZE 2X2 8PLY STRL LF (GAUZE/BANDAGES/DRESSINGS) IMPLANT
SPONGE SURGIFLO 8M (HEMOSTASIS) ×2 IMPLANT
STAPLER SKIN PROX 35W (STAPLE) IMPLANT
SUT BONE WAX W31G (SUTURE) ×3 IMPLANT
SUT DVC VLOC 3-0 CL 6 P-12 (SUTURE) ×12 IMPLANT
SUT ETHILON 3-0 FS-10 30 BLK (SUTURE)
SUT VIC AB 0 CT1 27 (SUTURE) ×1
SUT VIC AB 0 CT1 27XCR 8 STRN (SUTURE) ×2 IMPLANT
SUT VIC AB 2-0 CT1 18 (SUTURE) ×9 IMPLANT
SUTURE EHLN 3-0 FS-10 30 BLK (SUTURE) IMPLANT
SYR 10ML LL (SYRINGE) ×3 IMPLANT
SYR 20ML LL LF (SYRINGE) ×3 IMPLANT
SYR 30ML LL (SYRINGE) ×6 IMPLANT
SYR 3ML LL SCALE MARK (SYRINGE) ×3 IMPLANT
TOWEL OR 17X26 4PK STRL BLUE (TOWEL DISPOSABLE) ×9 IMPLANT
TRAY FOLEY MTR SLVR 16FR STAT (SET/KITS/TRAYS/PACK) IMPLANT
TUBING CONNECTING 10 (TUBING) ×9 IMPLANT

## 2020-06-17 NOTE — Progress Notes (Signed)
Procedure: L4-5 XLIF with PSF, R L2-3 microdiscectomy Procedure date: 06/17/2020 Diagnosis:    History: Casey [name] is s/p L4-5 XLIF with PSF, R L2-3 microdiscectomy POD: Tolerated procedure well. Evaluated in post op recovery still disoriented from anesthesia but able to answer questions and obey commands.   Physical Exam: Vitals:   06/18/20 0748 06/18/20 1112  BP: (!) 141/60 (!) 125/58  Pulse: 64 79  Resp: 16 15  Temp:  98 F (36.7 C)  SpO2: 100% 100%    General: drowsy, lying in bed Strength:5/5 throughout  Sensation: intact and symmetric throughout Skin: incision to left side and lower back with dressings and are clean, dry, intact  Data:  No results for input(s): NA, K, CL, CO2, BUN, CREATININE, LABGLOM, GLUCOSE, CALCIUM in the last 168 hours. No results for input(s): AST, ALT, ALKPHOS in the last 168 hours.  Invalid input(s): TBILI   No results for input(s): WBC, HGB, HCT, PLT in the last 168 hours. No results for input(s): APTT, INR in the last 168 hours.        Assessment/Plan:  Casey [name] is POD0 s/p L4-5 XLIF with PSF, R L2-3 microdiscectomy. Will continue to monitor  - mobilize - pain control - PT - Imaging - BP control prn   [name], NP Department of Neurosurgery

## 2020-06-17 NOTE — Anesthesia Procedure Notes (Signed)
Procedure Name: Intubation Date/Time: 06/17/2020 1:47 PM Performed by: Nolon Lennert, RN Pre-anesthesia Checklist: Patient identified, Patient being monitored, Timeout performed, Emergency Drugs available and Suction available Patient Re-evaluated:Patient Re-evaluated prior to induction Oxygen Delivery Method: Circle System Utilized Preoxygenation: Pre-oxygenation with 100% oxygen Induction Type: IV induction Ventilation: Mask ventilation without difficulty Laryngoscope Size: 3 and McGraph Grade View: Grade III Tube type: Oral Tube size: 7.0 mm Number of attempts: 1 Airway Equipment and Method: Stylet,  Oral airway and Video-laryngoscopy Placement Confirmation: ETT inserted through vocal cords under direct vision,  positive ETCO2 and breath sounds checked- equal and bilateral Secured at: 20 cm Tube secured with: Tape Dental Injury: Teeth and Oropharynx as per pre-operative assessment  Difficulty Due To: Difficulty was unanticipated Comments: View Grade III with Mac 3 DVL, View Grade III with McGrath 3, successful intubation with McGrath 3.

## 2020-06-17 NOTE — Op Note (Signed)
Indications: Casey [name] is a 74 yo female who presented with anterolisthesis as well as lumbar radiculopathy.  She failed conservative management and elected for surgical intervention.  Findings: correction of spondylolisthesis  Preoperative Diagnosis: anterolisthesis, lumbar radiculopathy Postoperative Diagnosis: same   EBL: 100 ml IVF: 120 ml Drains: none Disposition: Extubated and Stable to PACU Complications: none  A foley catheter was placed.   Preoperative Note:   Risks of surgery discussed include: infection, bleeding, stroke, coma, death, paralysis, CSF leak, nerve/spinal cord injury, numbness, tingling, weakness, complex regional pain syndrome, recurrent stenosis and/or disc herniation, vascular injury, development of instability, neck/back pain, need for further surgery, persistent symptoms, development of deformity, and the risks of anesthesia. The patient understood these risks and agreed to proceed.  NAME OF ANTERIOR PROCEDURE:               1. Anterior lumbar interbody fusion via a left lateral retroperitoneal approach at L4/5 2. Placement of a Lordotic Globus Rise L 7-14 x 18x45 interbody cage, filled with Demineralized Bone Matrix and BMP   NAME OF POSTERIOR PROCEDURE: 1. Posterior instrumentation using Globus Creo Nuvasive Reline Instrumentation 2. Posterolateral fusion, L4-5      PROCEDURE:  Patient was brought to the operating room, intubated, turned to the lateral position.  All pressure points were checked and double-checked.  The patient was prepped and draped in the standard fashion. Prior to prepping, fluoroscopy was brought in and the patient was positioned with a large bump under the contralateral side between the iliac crest and rib cage, allowing the area between the iliac crest and the lateral aspect of the rib cage to open and increase the ability to reach inferiorly, to facilitate entry into the disc space.  The incision was marked upon the skin both the  location of the disc space as well as the superior most aspect of the iliac crest.  Based on the identification of the disc space an incision was prepared, marked upon the skin and eventually was used for our lateral incision.  The fluoroscopy was turned into a cross table A/P image in order to confirm that the patient's spine remained in a perpendicular trajectory to the floor without rotation.  Once confirming that all the pressure points were checked and double-checked and the patient remained in sturdy position strapped down in this slightly jack-knifed lateral position, the patient was prepped and draped in standard fashion.  The skin was injected with local anesthetic, then incised until the abdominal wall fascia was noted.  I bluntly dissected posteriorly until we were able to identify the posterior musculature near petit's triangle.  At this point, using primarily blunt dissection with our finger aided with a metzenbaum scissor, were able to enter the retroperitoneal cavity.  The retroperitoneal potential space was opened further until palpating out the psoas muscle, the medial aspect of the iliac crest, the medial aspect of the last rib and continued to define the retroperitoneal space with blunt dissection in order to facilitate safe placement of our dilators.    While protecting by dissecting directly onto a finger in the retroperitoneum, the retroperitoneal space was entered safely from the lateral incision and the initial dilator placed onto the muscle belly of the psoas.  While directly stimulating the dilator and after radiographically confirming our location relative to the disc space, I placed the dilator through the psoas.  The dilators were stimulated to ensure remaining safely away from any of the lumbar plexus nerves; the dilators were repositioned until  no pathologic stimulation was appreciated.  Once I had confirmed the location of our initial dilator radiographically, a K-wire secured the  dilator into the L4/5 disc space and confirmed position under A/P and lateral fluoroscopy.  At this point, I dilated up with direct stimulation to confirm lack of pathologic stimulation.  Once all the dilators were in position, I placed in the retractor and secured it onto the table, locked into position and confirmed under A/P and lateral fluoroscopy to confirm our approach angle to the disc space as well as location relative to the disc space.  I then placed the muscle stimulator in through the working channel down to the vertebral body, stimulating the entire lateral surface of the vertebral body and any of the visualized psoas muscle that was adjacent to the retractor, confirming again the safe passage to the psoas before we began performing the discectomy.  At this point, we began our discectomy at L4/5.  The disc was incised laterally throughout the extent of our exposure. Using a combination of pituitary rongeurs, Kerrison rongeurs, rasps, curettes of various sorts, we were able to begin to clean out the disc space.  Once we had cleaned out the majority of the disc space, we then cut the lateral annulus with a cob, breaking the lateral annual attachments on the contralateral side by subtly working the cob through the annulus while using flouroscopy.  Care was taken not to extend further than required after cutting the annular attachments.  After this had been performed, we prepared the endplates for placement of our graft, sized a graft to the disc space by serially dilating up in trial sizes until we confirmed that our graft would be well positioned, allowing distraction while maintaining good grip.  This was confirmed under A/P and lateral fluoroscopy in order to ensure its placement as an eventual trial for placement of our final graft.  We irrigated with bacteriostatic saline.  Once confirmed placement, the Rise implant filled with allograft was impacted into position at L4/5. BMP was also used on the  anterior fusion.   Through a combination of intradiscal distraction and anterior releasing, we were able to correct the anterior deformity during disc preparation and placement of the graft.     At this point, final radiographs were performed, and we began closure.  The wound was closed using 0 Vicryl interrupted suture in the fascia and 2-0 Vicryl inverted suture were placed in the subcutaneous tissue and dermis. 3-0 monocryl was used for final closure. Dermabond was used to close the skin.    After closing the anterior part in layers, the patient was repositioned into prone position.  All pressure points were checked and double-checked and we brought in fluoroscopy to confirm our approach angles for putting in percutaneous pedicle screws.  The pedicles were marked using true AP flouroscopy, adjusting the angle at each level.  We then prepped and draped the patient in the standard fashion.  At this point, incisions were made for placing percutaneous pedicle screw instrumentation at L4-5.  Starting at L4, a Jamsheedi needle was used to cannulate the pedicle bilaterally using AP flouroscopy. Direct stimulation was used on the needle without any low (<10 mAmp) stimulation thresholds. After cannulation of the pedicle to 30 mm, a K-wire was placed through the [place] approximately and secured.   Using a similar technique, the pedicles at L4-L5 were cannulated and K wires secured. The K wires were then checked using lateral flouroscopy to ensure placement into the vertebral  bodies. After confirming placement of K wires, cannulated pedicle screws were introduced over the K wires at each level.  After advancing each screw into the vertebral body approximately 25-30 mm, the K wire was removed.At each level, 6.5x48mm Globus Creo pedicle screws were placed under lateral flouroscopy. Once the screws were placed, we checked thresholds for stimulation, and all showed minimal stimulation at 20 mAmp. The screw extensions  were then linked, a path was formed for the rod and a rod was utilized to connect the screws.  We then compressed, torqued / counter-torqued and removed the screw assembly. Once performed on each side, confirmatory AP and lateral x-rays were taken.    We then moved to the L2-3 microdiscectomy.  1) right L2/3 microdiscectomy  The patient was then brought from the preoperative center with intravenous access established.  The patient underwent general anesthesia and endotracheal tube intubation, and was then rotated on the [place] rail top where all pressure points were appropriately padded.  The skin was then thoroughly cleansed.  Perioperative antibiotic prophylaxis was administered.  Sterile prep and drapes were then applied and a timeout was then observed.  C-arm was brought into the field under sterile conditions, and the L2-3 disc space identified and marked with an incision on the right 1cm lateral to midline.  Once this was complete a 2 cm incision was opened with the use of a #10 blade knife.  The Metrx tubes were sequentially advanced under lateral fluoroscopy until a 18 x 40 mm Metrx tube was placed over the facet and lamina and secured to the bed.    The microscope was then sterilely brought into the field and muscle creep was hemostased with a bipolar and resected with a pituitary rongeur.  A Bovie extender was then used to expose the spinous process and lamina.  Careful attention was placed to not violate the facet capsule. A 3 mm matchstick drill bit was then used to make a hemi-laminotomy trough until the ligamentum flavum was exposed.  This was extended to the base of the spinous process.  Once this was complete and the underlying ligamentum flavum was visualized this was dissected with an up angle curette and resected with a #2 and #3 mm biting Kerrison.  The laminotomy opening was also expanded in similar fashion and hemostasis was obtained with Surgifoam and a patty as well as bone wax.   The rostral aspect of the caudal level of the lamina was also resected with a #2 biting Kerrison effort to further enhance exposure.  Once the underlying dura was visualized a Penfield 4 was then used to dissect and expose the traversing nerve root.  Once this was identified a nerve root retractor suction was used to mobilize this medially.  The venous plexus was hemostased with Surgifoam and light bipolar use.  The disc herniation was partially removed for microdiscectomy, and extensive overgrowth of tissue was also removed for medial facetectomy and foraminotomy.  The disc herniation was identified and dissected free using a balltip probe. The pituitary rongeur was used to remove the extruded disc fragments. Once the thecal sac and nerve root were noted to be relaxed and under less tension the ball-tipped feeler was passed along the foramen distally to to ensure no residual compression was noted.    The area was irrigated. The tube system was then removed under microscopic visualization and hemostasis was obtained with a bipolar.    The fascial layer was reapproximated with the use of a 0- Vicryl suture.  Subcutaneous tissue layer was reapproximated using 2-0 Vicryl suture.  3-0 monocryl was used on the skin. The skin was then cleansed and Dermabond was used to close the skin opening.  Patient was then rotated back to the preoperative bed awakened from anesthesia and taken to recovery all counts are correct in this case.   I performed the entire procedure with the assistance of [name] NP as an Designer, television/film set.  Venetia Night MD

## 2020-06-17 NOTE — Progress Notes (Signed)
Pt BP elevated: 195/92. Dr. Henrene Hawking notified. Acknowledged. Stated to treat pain first and if BP is still elevated to notify him.

## 2020-06-17 NOTE — Anesthesia Postprocedure Evaluation (Signed)
Anesthesia Post Note  Patient: Casey [name]  Procedure(s) Performed: L4-5 EXTREME LATERAL INTERBODY FUSION(XLIF)/POSTERIOR SPINAL FUSION (PSF) (N/A ) RIGHT L2-3 MICRODISCECTOMY (Right )  Patient location during evaluation: PACU Anesthesia Type: General Level of consciousness: awake and alert Pain management: pain level controlled Vital Signs Assessment: post-procedure vital signs reviewed and stable Respiratory status: spontaneous breathing and respiratory function stable Cardiovascular status: stable Anesthetic complications: no   No complications documented.   Last Vitals:  Vitals:   06/17/20 2003 06/17/20 2023  BP: (!) 160/82 (!) 162/72  Pulse: 92 90  Resp: 14 16  Temp: 36.6 C 36.4 C  SpO2: 96% 99%    Last Pain:  Vitals:   06/17/20 2023  TempSrc: Oral  PainSc:                  Rain Friedt K

## 2020-06-17 NOTE — Transfer of Care (Signed)
Immediate Anesthesia Transfer of Care Note  Patient: ILYSSA GRENNAN  Procedure(s) Performed: L4-5 EXTREME LATERAL INTERBODY FUSION(XLIF)/POSTERIOR SPINAL FUSION (PSF) (N/A ) RIGHT L2-3 MICRODISCECTOMY (Right )  Patient Location: PACU  Anesthesia Type:General  Level of Consciousness: drowsy  Airway & Oxygen Therapy: Patient Spontanous Breathing and Patient connected to nasal cannula oxygen  Post-op Assessment: Report given to RN and Post -op Vital signs reviewed and stable  Post vital signs: Reviewed and stable  Last Vitals:  Vitals Value Taken Time  BP 169/97 06/17/20 1831  Temp    Pulse 83 06/17/20 1834  Resp 14 06/17/20 1834  SpO2 100 % 06/17/20 1834  Vitals shown include unvalidated device data.  Last Pain:  Vitals:   06/17/20 1232  TempSrc: Temporal  PainSc: 9          Complications: No complications documented.

## 2020-06-17 NOTE — Consult Note (Addendum)
Pharmacy Antibiotic Note  Casey [name] is a 74 y.o. female admitted on 06/17/2020 with surgical prophylaxis.    Pharmacy has been consulted for vancomycin/Cefazolin dosing.  Plan: Will dose Vancomycin IV 1000mg  IV x 1, Cefazolin 2g IV x 1   - to be started within 60-120 minutes prior to surgical incision (Vancomycin dose may be repeated in ~ 12 hours, Cefazolin dose in ~ 4 hours - contact pharmacy if surgery is prolonged and a second dose of either is deemed necessary)    Recent Labs  Lab 06/11/20 0959  WBC 6.9  CREATININE 0.85    Estimated Creatinine Clearance: 54.9 mL/min (by C-G formula based on SCr of 0.85 mg/dL).    Allergies  Allergen Reactions  . Leflunomide Other (See Comments)    GI upset    . Methotrexate Other (See Comments)    GI upset    Thank you for allowing pharmacy to be a part of this patient's care.  06/13/20, PharmD, BCPS Clinical Pharmacist 06/17/2020 12:20 PM

## 2020-06-17 NOTE — Anesthesia Preprocedure Evaluation (Signed)
Anesthesia Evaluation  Patient identified by MRN, date of birth, ID band Patient awake    Reviewed: Allergy & Precautions, H&P , NPO status , Patient's Chart, lab work & pertinent test results  History of Anesthesia Complications Negative for: history of anesthetic complications  Airway Mallampati: III  TM Distance: >3 FB Neck ROM: limited    Dental  (+) Chipped   Pulmonary neg pulmonary ROS, neg shortness of breath,    Pulmonary exam normal        Cardiovascular Exercise Tolerance: Good (-) angina(-) Past MI and (-) DOE negative cardio ROS Normal cardiovascular exam     Neuro/Psych  Neuromuscular disease negative psych ROS   GI/Hepatic negative GI ROS, Neg liver ROS, neg GERD  ,  Endo/Other  negative endocrine ROS  Renal/GU      Musculoskeletal  (+) Arthritis ,   Abdominal   Peds  Hematology negative hematology ROS (+)   Anesthesia Other Findings Past Medical History: No date: Allergy No date: Chronic back pain No date: History of kidney stones No date: Osteoporosis No date: Rheumatoid arthritis (HCC)  Past Surgical History: No date: EPIDURAL BLOCK INJECTION No date: great toe; Left     Comment:  straightened and pinned No date: NECK SURGERY No date: ROTATOR CUFF REPAIR; Right     Reproductive/Obstetrics negative OB ROS                             Anesthesia Physical Anesthesia Plan  ASA: II  Anesthesia Plan: General ETT   Post-op Pain Management:    Induction: Intravenous  PONV Risk Score and Plan: Ondansetron, Dexamethasone, Midazolam and Treatment may vary due to age or medical condition  Airway Management Planned: Oral ETT  Additional Equipment:   Intra-op Plan:   Post-operative Plan: Extubation in OR  Informed Consent: I have reviewed the patients History and Physical, chart, labs and discussed the procedure including the risks, benefits and alternatives  for the proposed anesthesia with the patient or authorized representative who has indicated his/her understanding and acceptance.     Dental Advisory Given  Plan Discussed with: Anesthesiologist, CRNA and Surgeon  Anesthesia Plan Comments: (Patient consented for risks of anesthesia including but not limited to:  - adverse reactions to medications - damage to eyes, teeth, lips or other oral mucosa - nerve damage due to positioning  - sore throat or hoarseness - Damage to heart, brain, nerves, lungs, other parts of body or loss of life  Patient voiced understanding.)        Anesthesia Quick Evaluation

## 2020-06-18 ENCOUNTER — Inpatient Hospital Stay: Payer: Medicare HMO

## 2020-06-18 ENCOUNTER — Encounter: Payer: Self-pay | Admitting: Neurosurgery

## 2020-06-18 MED ORDER — METHOCARBAMOL 1000 MG/10ML IJ SOLN
500.0000 mg | Freq: Four times a day (QID) | INTRAVENOUS | Status: DC
  Filled 2020-06-18: qty 5

## 2020-06-18 MED ORDER — METHOCARBAMOL 500 MG PO TABS
750.0000 mg | ORAL_TABLET | Freq: Four times a day (QID) | ORAL | Status: DC
  Administered 2020-06-18 – 2020-06-19 (×5): 750 mg via ORAL
  Filled 2020-06-18 (×5): qty 2

## 2020-06-18 NOTE — Evaluation (Signed)
Physical Therapy Evaluation Patient Details Name: Casey [name] MRN: [id number] DOB: [birth year] Today's Date: 06/18/2020   History of Present Illness  Patient is a 74 year old female with anterolisthesis, lumbar radiculopathy. S/p  L4-5 extreme lateral interbody fusion, posterior spinal fusion, right L2-3 microdiscectomy. History of neck surgery, right rotator cuff repair, rheumatoid arthritis, chronic back pain, osteoporosis  Clinical Impression  PT evaluation completed. Patient was motivated to get OOB and reports she was independent with mobility prior to surgery. Verbal cues for general spine precautions with mobility. Patient required Min guard for bed mobility and supervision to stand and ambulate in hallway with rolling walker. Patient does report 8/10 pain on left side near surgical area. Recommend to use rolling walker for support with ambulation. Also recommend PT to address functional deficits noted below to maximize function, safety, and return to PLOF.     Follow Up Recommendations Home health PT    Equipment Recommendations  Rolling walker with 5" wheels;3in1 (PT)    Recommendations for Other Services       Precautions / Restrictions Precautions Precautions: Back;Fall Precaution Comments: no brace needed  Restrictions Weight Bearing Restrictions: No      Mobility  Bed Mobility Overal bed mobility: Needs Assistance Bed Mobility: Supine to Sit     Supine to sit: Min guard     General bed mobility comments: verbal cues for logroll technique. no use of bed rails   Transfers Overall transfer level: Needs assistance   Transfers: Sit to/from Stand Sit to Stand: Supervision         General transfer comment: supervision for safety. cues for safe hand placement   Ambulation/Gait Ambulation/Gait assistance: Supervision Gait Distance (Feet): 100 Feet Assistive device: Rolling walker (2 wheeled) Gait Pattern/deviations: Step-to pattern Gait velocity: decreased     General Gait Details: Cues for using rolling walker for safety with ambulation for fall prevention. Patient did report feeling "woozy" with ambulation.   Stairs            Wheelchair Mobility    Modified Rankin (Stroke Patients Only)       Balance Overall balance assessment: Needs assistance Sitting-balance support: Feet supported;No upper extremity supported Sitting balance-Leahy Scale: Good Sitting balance - Comments: static and dynamic sitting balance good    Standing balance support: Bilateral upper extremity supported Standing balance-Leahy Scale: Fair Standing balance comment: patient using rolling walker for UE support in standing                              Pertinent Vitals/Pain Pain Assessment: 0-10 Pain Score: 8  Pain Location: left flank incision area  Pain Descriptors / Indicators: Sore Pain Intervention(s): Monitored during session;Repositioned;Limited activity within patient's tolerance    Home Living Family/patient expects to be discharged to:: Private residence Living Arrangements:  (granddaughter and daughter ) Available Help at Discharge: Family;Available PRN/intermittently Type of Home: House Home Access:  (one step to enter )     Home Layout: Able to live on main level with bedroom/bathroom Home Equipment: Shower seat - built in      Prior Function Level of Independence: Independent         Comments: drives and is independent with mobility      Hand Dominance   Dominant Hand: Right    Extremity/Trunk Assessment   Upper Extremity Assessment Upper Extremity Assessment: Overall WFL for tasks assessed    Lower Extremity Assessment Lower Extremity Assessment: RLE deficits/detail;LLE  deficits/detail RLE Sensation: WNL LLE Deficits / Details: dorsiflexion 4+/5  LLE Sensation: WNL       Communication   Communication: No difficulties  Cognition Arousal/Alertness: Awake/alert Behavior During Therapy: WFL for tasks  assessed/performed Overall Cognitive Status: Within Functional Limits for tasks assessed                                        General Comments      Exercises     Assessment/Plan    PT Assessment Patient needs continued PT services  PT Problem List Decreased strength;Decreased balance;Decreased activity tolerance;Decreased mobility;Decreased knowledge of use of DME;Decreased knowledge of precautions;Pain       PT Treatment Interventions DME instruction;Gait training;Stair training;Functional mobility training;Therapeutic activities;Therapeutic exercise;Neuromuscular re-education;Balance training;Patient/family education    PT Goals (Current goals can be found in the Care Plan section)  Acute Rehab PT Goals Patient Stated Goal: to return home  PT Goal Formulation: With patient Time For Goal Achievement: 07/02/20 Potential to Achieve Goals: Good    Frequency BID   Barriers to discharge        Co-evaluation               AM-PAC PT "6 Clicks" Mobility  Outcome Measure Help needed turning from your back to your side while in a flat bed without using bedrails?: A Little Help needed moving from lying on your back to sitting on the side of a flat bed without using bedrails?: A Little Help needed moving to and from a bed to a chair (including a wheelchair)?: A Little Help needed standing up from a chair using your arms (e.g., wheelchair or bedside chair)?: A Little Help needed to walk in hospital room?: A Little Help needed climbing 3-5 steps with a railing? : A Little 6 Click Score: 18    End of Session Equipment Utilized During Treatment: Gait belt Activity Tolerance: Patient tolerated treatment well Patient left: in bed;with bed alarm set;with SCD's reapplied;with call bell/phone within reach Nurse Communication:  (white board updated with mobility status ) PT Visit Diagnosis: Unsteadiness on feet (R26.81);Muscle weakness (generalized) (M62.81)     Time: 6256-3893 PT Time Calculation (min) (ACUTE ONLY): 34 min   Charges:   PT Evaluation $PT Eval Moderate Complexity: 1 Mod PT Treatments $Gait Training: 8-22 mins       Donna Bernard, PT, MPT   Ina Homes 06/18/2020, 10:47 AM

## 2020-06-18 NOTE — Care Management Important Message (Signed)
Important Message  Patient Details  Name: Casey [name] MRN: [id number] Date of Birth: Oct 15, 1946   Medicare Important Message Given:  N/A - LOS <3 / Initial given by admissions     Johnell Comings 06/18/2020, 2:00 PM

## 2020-06-18 NOTE — TOC Progression Note (Signed)
Transition of Care [facility]) - Progression Note    Patient Details  Name: ALEXANDRINA FIORINI MRN: [id number] Date of Birth: 14-Aug-1946  Transition of Care [facility]) CM/SW [place], RN Phone Number: 06/18/2020, 12:15 PM  Clinical Narrative:   Met the patient in the room to discuss [place] and needs She lives at home with her daughter  She still drives She is going to be going to Outpatient PT set up by Dr office She needs a RW and a 3 in 1, I called Zack at [employer] to arrange to be brought to the room, has a PCP and is up to date, Can afford medciation    Expected Discharge Plan: OP Rehab Barriers to Discharge: Barriers Resolved  Expected Discharge Plan and Services Expected Discharge Plan: OP Rehab   Discharge Planning Services: CM Consult   Living arrangements for the past 2 months: Single Family Home                 DME Arranged: 3-N-1, Walker rolling DME Agency: [employer] Date DME Agency Contacted: 06/18/20                 Social Determinants of Health (SDOH) Interventions    Readmission Risk Interventions No flowsheet data found.

## 2020-06-18 NOTE — Progress Notes (Signed)
Physical Therapy Treatment Patient Details Name: Casey [name] MRN: [id number] DOB: 08/07/1946 Today's Date: 06/18/2020    History of Present Illness Patient is a 74 year old female with anterolisthesis, lumbar radiculopathy. S/p  L4-5 extreme lateral interbody fusion, posterior spinal fusion, right L2-3 microdiscectomy. History of neck surgery, right rotator cuff repair, rheumatoid arthritis, chronic back pain, osteoporosis    PT Comments    Patient is making progress with functional mobility and independence. Patient ambulated 2 laps around the nursing unit with supervision using rolling walker. Educated patient on importance to continue using rolling walker for mobility for safety and fall prevention. Patient does report left side buttock and low back pain during session, however this does not appear to impact functional independence with ambulation and transfers. Recommend to continue PT to maximize function and safety to facilitate independence.    Follow Up Recommendations  Home health PT     Equipment Recommendations  Rolling walker with 5" wheels;3in1 (PT)    Recommendations for Other Services       Precautions / Restrictions Precautions Precautions: Back;Fall Precaution Comments: no brace needed  Restrictions Weight Bearing Restrictions: No    Mobility  Bed Mobility Overal bed mobility: Needs Assistance Bed Mobility: Supine to Sit     Supine to sit: Min guard     General bed mobility comments: verbal cues for logroll technique to maintain general spine precautions with activity   Transfers Overall transfer level: Needs assistance   Transfers: Sit to/from Stand Sit to Stand: Supervision         General transfer comment: supervision and cues for safety   Ambulation/Gait Ambulation/Gait assistance: Supervision Gait Distance (Feet): 300 Feet Assistive device: Rolling walker (2 wheeled) Gait Pattern/deviations: Step-through pattern Gait velocity:  decreased    General Gait Details: cues for technique using rolling walker and importance of using rolling walker for safety and fall prevention during ambulation efforts. no loss of balance noted with mobility    Stairs             Wheelchair Mobility    Modified Rankin (Stroke Patients Only)       Balance   Sitting-balance support: Feet supported;No upper extremity supported Sitting balance-Leahy Scale: Good Sitting balance - Comments: static and dynamic sitting balance good    Standing balance support: Bilateral upper extremity supported Standing balance-Leahy Scale: Good Standing balance comment: patient able to stand for brief periods without UE supported on rolling walker and no loss of balance. supervision for safety                             Cognition Arousal/Alertness: Awake/alert Behavior During Therapy: WFL for tasks assessed/performed Overall Cognitive Status: Within Functional Limits for tasks assessed                                        Exercises      General Comments        Pertinent Vitals/Pain Pain Assessment: Faces Faces Pain Scale: Hurts a little bit Pain Location: left side buttock pain  Pain Descriptors / Indicators: Sore Pain Intervention(s): Monitored during session    Home Living                      Prior Function            PT Goals (  current goals can now be found in the care plan section) Acute Rehab PT Goals Patient Stated Goal: to return home  PT Goal Formulation: With patient Time For Goal Achievement: 07/02/20 Potential to Achieve Goals: Good    Frequency    BID      PT Plan      Co-evaluation              AM-PAC PT "6 Clicks" Mobility   Outcome Measure  Help needed turning from your back to your side while in a flat bed without using bedrails?: A Little Help needed moving from lying on your back to sitting on the side of a flat bed without using bedrails?: A  Little Help needed moving to and from a bed to a chair (including a wheelchair)?: A Little Help needed standing up from a chair using your arms (e.g., wheelchair or bedside chair)?: A Little Help needed to walk in hospital room?: A Little Help needed climbing 3-5 steps with a railing? : A Little 6 Click Score: 18    End of Session Equipment Utilized During Treatment: Gait belt Activity Tolerance: Patient tolerated treatment well Patient left: in chair;with call bell/phone within reach   PT Visit Diagnosis: Unsteadiness on feet (R26.81);Muscle weakness (generalized) (M62.81)     Time: 1500-1520 PT Time Calculation (min) (ACUTE ONLY): 20 min  Charges:  $Gait Training: 8-22 mins                     Donna Bernard, PT, MPT    Ina Homes 06/18/2020, 3:29 PM

## 2020-06-18 NOTE — Progress Notes (Signed)
Procedure: L4-5 XLIF with PSF, R L2-3 microdiscectomy Procedure date: 06/17/2020 Diagnosis: anterolisthesis, lumbar radiculopathy  History: Casey [name] is s/p L4-5 XLIF with PSF, R L2-3 microdiscectomy POD1: Pt evaluated this morning, she reports that her pain is moderate to severe to her lower back as well as left thigh but she is able to get out of the bed and pivot with nursing assistance. She denies any lower extremity weakness. BP has improved overnight. POD0: Tolerated procedure well. Evaluated in post op recovery still disoriented from anesthesia but able to answer questions and obey commands.   Physical Exam: Vitals:   06/18/20 0748 06/18/20 1112  BP: (!) 141/60 (!) 125/58  Pulse: 64 79  Resp: 16 15  Temp:  98 F (36.7 C)  SpO2: 100% 100%    General: drowsy, lying in bed Strength:5/5 throughout  Sensation: intact and symmetric throughout Skin: incision to left side and lower back with dressings and are clean, dry, intact  Data:  No results for input(s): NA, K, CL, CO2, BUN, CREATININE, LABGLOM, GLUCOSE, CALCIUM in the last 168 hours. No results for input(s): AST, ALT, ALKPHOS in the last 168 hours.  Invalid input(s): TBILI   No results for input(s): WBC, HGB, HCT, PLT in the last 168 hours. No results for input(s): APTT, INR in the last 168 hours.        Assessment/Plan:  Casey [name] is POD0 s/p L4-5 XLIF with PSF, R L2-3 microdiscectomy. Will continue to monitor. She will work with PT today and we will evaluate when she will be ready to leave the hospital.   - mobilize - pain control - PT - Imaging - BP control prn   Casey Berthold, NP Department of Neurosurgery

## 2020-06-18 NOTE — Progress Notes (Signed)
Patient ambulated to the nurse's station and back to the room. Ambulated to the bathroom and back to bed. Foley removed at 4am patient attempted to void but was not able to .

## 2020-06-19 ENCOUNTER — Encounter: Payer: Self-pay | Admitting: Neurosurgery

## 2020-06-19 MED ORDER — METHOCARBAMOL 500 MG PO TABS: 750.0000 mg | ORAL_TABLET | Freq: Four times a day (QID) | ORAL | 0 refills | Status: AC

## 2020-06-19 MED ORDER — ACETAMINOPHEN 500 MG PO TABS: 1000.0000 mg | ORAL_TABLET | Freq: Four times a day (QID) | ORAL | 0 refills | Status: AC | PRN

## 2020-06-19 MED ORDER — SENNA 8.6 MG PO TABS: 1.0000 | ORAL_TABLET | Freq: Two times a day (BID) | ORAL | 0 refills | Status: DC

## 2020-06-19 MED ORDER — POLYETHYLENE GLYCOL 3350 17 G PO PACK: 17.0000 g | PACK | Freq: Every day | ORAL | 0 refills | Status: DC | PRN

## 2020-06-19 MED ORDER — METHOCARBAMOL 500 MG PO TABS: 1000.0000 mg | ORAL_TABLET | Freq: Four times a day (QID) | ORAL | Status: DC

## 2020-06-19 MED ORDER — CELECOXIB 100 MG PO CAPS: 100.0000 mg | ORAL_CAPSULE | Freq: Two times a day (BID) | ORAL | 1 refills | Status: AC

## 2020-06-19 MED ORDER — OXYCODONE HCL 5 MG PO TABS: 5.0000 mg | ORAL_TABLET | ORAL | 0 refills | Status: AC | PRN

## 2020-06-19 MED ORDER — ACETAMINOPHEN 500 MG PO TABS
1000.0000 mg | ORAL_TABLET | Freq: Four times a day (QID) | ORAL | Status: DC | PRN
  Administered 2020-06-19: 1000 mg via ORAL
  Filled 2020-06-19: qty 2

## 2020-06-19 MED ORDER — CELECOXIB 100 MG PO CAPS
100.0000 mg | ORAL_CAPSULE | Freq: Two times a day (BID) | ORAL | Status: DC
  Administered 2020-06-19: 100 mg via ORAL
  Filled 2020-06-19 (×2): qty 1

## 2020-06-19 MED ORDER — LIDOCAINE 5 % EX PTCH
1.0000 | MEDICATED_PATCH | CUTANEOUS | Status: DC
  Administered 2020-06-19: 1 via TRANSDERMAL
  Filled 2020-06-19: qty 1

## 2020-06-19 NOTE — Discharge Summary (Signed)
Procedure: L4-5 XLIF with PSF, R L2-3 microdiscectomy Procedure date: 06/17/2020 Diagnosis: anterolisthesis, lumbar radiculopathy  History: Casey [name] is s/p L4-5 XLIF with PSF, R L2-3 microdiscectomy  POD2: Pt has cleared PT, ambulating well. DME has been ordered and delivered. She reports that her pain is moderately well controlled. She is passing flatus without nausea. POD1: Pt evaluated this morning, she reports that her pain is moderate to severe to her lower back as well as left thigh but she is able to get out of the bed and pivot with nursing assistance. She denies any lower extremity weakness. BP has improved overnight. POD0: Tolerated procedure well. Evaluated in post op recovery still disoriented from anesthesia but able to answer questions and obey commands.   Physical Exam: Vitals:   06/19/20 0721 06/19/20 1138  BP: (!) 139/65 127/68  Pulse: 86 84  Resp: 17 17  Temp: 98.4 F (36.9 C) 98.6 F (37 C)  SpO2: 100% 100%    General: Alert, oriented x 4. Sitting in bed Strength:5/5 throughout  Sensation: intact and symmetric throughout Skin: incision to left side and lower back with dressings and are clean, dry, intact  Data:  No results for input(s): NA, K, CL, CO2, BUN, CREATININE, LABGLOM, GLUCOSE, CALCIUM in the last 168 hours. No results for input(s): AST, ALT, ALKPHOS in the last 168 hours.  Invalid input(s): TBILI   No results for input(s): WBC, HGB, HCT, PLT in the last 168 hours. No results for input(s): APTT, INR in the last 168 hours.        Assessment/Plan:  Casey [name] is POD2 s/p L4-5 XLIF with PSF, R L2-3 microdiscectomy.   She has been working with PT and is cleared for discharge to home with outpatient PT.   She may remove dressings tomorrow and shower tomorrow.  She will follow up in clinic in two weeks with me.     [name], NP Department of Neurosurgery

## 2020-06-19 NOTE — Progress Notes (Signed)
DISCHARGE NOTE:  Discharge instructions given by charge nurse Rosey Bath, RN. Pt wheeled to car by staff, [facility] and walker sent with pt, daughter providing transportation.

## 2020-06-19 NOTE — Progress Notes (Signed)
Physical Therapy Treatment Patient Details Name: Casey [name] MRN: [id number] DOB: 10/16/1946 Today's Date: 06/19/2020    History of Present Illness Patient is a 74 year old female with anterolisthesis, lumbar radiculopathy. S/p  L4-5 extreme lateral interbody fusion, posterior spinal fusion, right L2-3 microdiscectomy. History of neck surgery, right rotator cuff repair, rheumatoid arthritis, chronic back pain, osteoporosis    PT Comments    Pt was long sitting in bed upon arriving. She agrees to PT session and reports soreness on L posterior gluts.She was able to perform all task with supervision only. Was able to get out of R side of bed, stand to RW and ambulate 160 ft ( 1 lap) around RN station. No LOB or unsteadiness however pt has very cautions antalgic step through pattern. Returned to room, and was seated up in recliner with BLEs elevated, pillow under knees and call bell in reach. Chair alarm in place for safety. Overall pt continues to demonstrate safe functional mobility and will have assistance available at home at DC. PT recommends DC to home with continued skilled PT either HHPT or outpatient. Follow surgeons recommendation for follow up therapy.      Follow Up Recommendations  Home health PT     Equipment Recommendations  Rolling walker with 5" wheels;3in1 (PT)    Recommendations for Other Services       Precautions / Restrictions Precautions Precautions: Back;Fall Precaution Comments: no brace needed  Restrictions Weight Bearing Restrictions: No    Mobility  Bed Mobility Overal bed mobility: Needs Assistance Bed Mobility: Supine to Sit     Supine to sit: Supervision     General bed mobility comments: Increased time to perform but was able to perform without physical assistance. she states she will have grabnd daughter available during the day and daughyter to assist at night.  Transfers Overall transfer level: Needs assistance Equipment used: Rolling  walker (2 wheeled) Transfers: Sit to/from Stand Sit to Stand: Supervision         General transfer comment: supervision for STS from EOB and from recliner  Ambulation/Gait Ambulation/Gait assistance: Supervision Gait Distance (Feet): 160 Feet Assistive device: Rolling walker (2 wheeled) Gait Pattern/deviations: WFL(Within Functional Limits) Gait velocity: decreased    General Gait Details: pt was able to safely ambulate 1 lap around RN station without LOB or unsteadiness. Pt did not want to ambulate further distances 2/2 to "soreness"   Stairs         General stair comments: pt reports not having stairs and does not feel like she wants to trial stair training until sorenss relieves    Wheelchair Mobility    Modified Rankin (Stroke Patients Only)       Balance Overall balance assessment: Modified Independent Sitting-balance support: Feet supported;No upper extremity supported Sitting balance-Leahy Scale: Good Sitting balance - Comments: static and dynamic sitting balance good    Standing balance support: Bilateral upper extremity supported Standing balance-Leahy Scale: Good                              Cognition Arousal/Alertness: Awake/alert Behavior During Therapy: WFL for tasks assessed/performed Overall Cognitive Status: Within Functional Limits for tasks assessed                                 General Comments: Pt is A and O x 4. she reports 10/10 pain however with further  discussion states its not pain its just sore. pain did not limit pt's abilities      Exercises      General Comments        Pertinent Vitals/Pain Pain Assessment: 0-10 Pain Score: 10-Worst pain ever Pain Location: left side buttock pain  Pain Descriptors / Indicators: Sore Pain Intervention(s): Limited activity within patient's tolerance;Monitored during session;Premedicated before session;Repositioned;Ice applied    Home Living                       Prior Function            PT Goals (current goals can now be found in the care plan section) Acute Rehab PT Goals Patient Stated Goal: to return home  Progress towards PT goals: Progressing toward goals    Frequency    BID      PT Plan Current plan remains appropriate    Co-evaluation              AM-PAC PT "6 Clicks" Mobility   Outcome Measure  Help needed turning from your back to your side while in a flat bed without using bedrails?: A Little Help needed moving from lying on your back to sitting on the side of a flat bed without using bedrails?: A Little Help needed moving to and from a bed to a chair (including a wheelchair)?: A Little Help needed standing up from a chair using your arms (e.g., wheelchair or bedside chair)?: A Little Help needed to walk in hospital room?: A Little Help needed climbing 3-5 steps with a railing? : A Little 6 Click Score: 18    End of Session Equipment Utilized During Treatment: Gait belt Activity Tolerance: Patient tolerated treatment well Patient left: in chair;with call bell/phone within reach;with chair alarm set;Other (comment) (ice pack applied to L side) Nurse Communication: Mobility status PT Visit Diagnosis: Unsteadiness on feet (R26.81);Muscle weakness (generalized) (M62.81)     Time: 2778-2423 PT Time Calculation (min) (ACUTE ONLY): 15 min  Charges:  $Gait Training: 8-22 mins                     Jetta Lout PTA 06/19/20, 9:46 AM

## 2020-06-19 NOTE — Discharge Instructions (Signed)
Your surgeon has performed an operation on your lumbar spine (low back) to fuse two or more of the vertebrae (bones) together. This procedure is performed to treat a number of different spinal problems, including narrowing of the spinal canal (stenosis), herniated discs, degenerative changes, and injuries.   Many times, patients feel better immediately after surgery and can "overdo it." Even if you feel well, it is important that you follow these activity guidelines. If you do not let your back heal properly from the surgery, you can increase the chance of return of your symptoms and other complications. The following are instructions to help in your recovery once you have been discharged from the hospital.   * Do not take anti-inflammatory medications for 3 months after surgery (naproxen [Aleve], ibuprofen [Advil, Motrin], etc.). These medications can prevent your bones from healing properly.  YOU MAY TAKE CELEBREX  Activity     No bending, lifting, or twisting ("BLT"). Avoid lifting objects heavier than 10 pounds (gallon milk jug).  Where possible, avoid household activities that involve lifting, bending, reaching, pushing, or pulling such as laundry, vacuuming, grocery shopping, and childcare. Try to arrange for help from friends and family for these activities while your back heals.   Increase physical activity slowly as tolerated.  Taking short walks is encouraged, but avoid strenuous exercise. Do not jog, run, bicycle, lift weights, or participate in any other exercises unless specifically allowed by your doctor. Avoid prolonged sitting, including car rides.   Talk to your doctor before resuming sexual activity.   You should not drive until cleared by your doctor.   Until released by your doctor, you should not return to work or school.  You should rest at home and let your body heal.   You may shower three days after your surgery.  After showering, lightly dab your incision dry. Do not take  a tub bath or go swimming until approved by your doctor at your follow-up appointment.   If your doctor ordered a lumbar brace for you, you should wear it whenever you are out of bed. You may remove it when lying down or sleeping. You should also wear it when riding in a car. Not all back surgeries require a lumbar brace.   If you smoke, we strongly recommend that you quit.  Smoking has been proven to interfere with normal bone healing and will dramatically reduce the success rate of your surgery. Please contact [facility] (800-QUIT-NOW) and use the resources at www.[facility].com for assistance in stopping smoking.   Surgical Incision   If you have a dressing on your incision, remove it 2 days after your surgery. Keep your incision area clean and dry.   If you have staples or stitches on your incision, you should have a follow up scheduled for removal. If you do not have staples or stitches, you will have steri-strips (small pieces of surgical tape) or Dermabond glue. The steri-strips/glue should begin to peel away within about a week (it is fine if the steri-strips fall off before then). If the strips are still in place one week after your surgery, you may gently remove them.   Diet           You may return to your usual diet. Be sure to stay hydrated.   When to Contact us   Although your surgery and recovery will likely be uneventful, you may have some residual numbness, aches, and pains in your back and/or legs. This is normal and should improve  in the next few weeks.   However, should you experience any of the following, contact us immediately:   - New numbness or weakness   - Pain that is progressively getting worse, and is not relieved by your pain medications or rest   - Bleeding, redness, swelling, pain, or drainage from surgical incision   - Chills or flu-like symptoms   - Fever greater than 101.0 F (38.3 C)   - Problems with bowel or bladder functions   - Difficulty breathing or  shortness of breath   - Warmth, tenderness, or swelling in your calf   Contact Information   - During office hours (Monday-Friday 9 am to 5 pm), please call your physician at [phone]  - After hours and weekends, please call [phone] and an answering service will put you in touch with either Dr. Adriana Simas or Dr. Myer Haff.   - For a life-threatening emergency, call 911

## 2020-06-23 ENCOUNTER — Other Ambulatory Visit: Payer: Self-pay | Admitting: Nurse Practitioner

## 2020-06-23 ENCOUNTER — Other Ambulatory Visit (HOSPITAL_COMMUNITY): Payer: Self-pay | Admitting: Nurse Practitioner

## 2020-06-23 DIAGNOSIS — R600 Localized edema: Secondary | ICD-10-CM

## 2020-06-24 ENCOUNTER — Other Ambulatory Visit: Payer: Self-pay

## 2020-06-24 ENCOUNTER — Ambulatory Visit
Admission: RE | Admit: 2020-06-24 | Discharge: 2020-06-24 | Disposition: A | Discharge status: Home / Self Care | Payer: Medicare HMO | Source: Ambulatory Visit | Attending: Nurse Practitioner | Admitting: Nurse Practitioner

## 2020-06-24 DIAGNOSIS — R600 Localized edema: Secondary | ICD-10-CM | POA: Diagnosis not present

## 2020-06-25 ENCOUNTER — Other Ambulatory Visit: Payer: Medicare HMO

## 2020-06-26 ENCOUNTER — Telehealth: Payer: Self-pay | Admitting: Primary Care

## 2020-06-26 ENCOUNTER — Other Ambulatory Visit: Payer: Self-pay | Admitting: Student in an Organized Health Care Education/Training Program

## 2020-06-26 DIAGNOSIS — R600 Localized edema: Secondary | ICD-10-CM | POA: Diagnosis not present

## 2020-06-26 DIAGNOSIS — M810 Age-related osteoporosis without current pathological fracture: Secondary | ICD-10-CM | POA: Diagnosis not present

## 2020-06-26 DIAGNOSIS — M7120 Synovial cyst of popliteal space [Baker], unspecified knee: Secondary | ICD-10-CM | POA: Diagnosis not present

## 2020-06-26 DIAGNOSIS — M48062 Spinal stenosis, lumbar region with neurogenic claudication: Secondary | ICD-10-CM | POA: Diagnosis not present

## 2020-06-26 DIAGNOSIS — M4316 Spondylolisthesis, lumbar region: Secondary | ICD-10-CM | POA: Diagnosis not present

## 2020-06-26 DIAGNOSIS — M069 Rheumatoid arthritis, unspecified: Secondary | ICD-10-CM | POA: Diagnosis not present

## 2020-06-26 DIAGNOSIS — M1990 Unspecified osteoarthritis, unspecified site: Secondary | ICD-10-CM | POA: Diagnosis not present

## 2020-06-26 DIAGNOSIS — Z981 Arthrodesis status: Secondary | ICD-10-CM | POA: Diagnosis not present

## 2020-06-26 DIAGNOSIS — Z4789 Encounter for other orthopedic aftercare: Secondary | ICD-10-CM | POA: Diagnosis not present

## 2020-06-26 DIAGNOSIS — M76899 Other specified enthesopathies of unspecified lower limb, excluding foot: Secondary | ICD-10-CM | POA: Diagnosis not present

## 2020-06-26 NOTE — Telephone Encounter (Signed)
-----   Message from [name], NP sent at 06/25/2020  2:41 PM EDT ----- Regarding: post op lower extremity swelling Hello, This is Engineer, site w Neurosurgery at [facility], we recently operated on Ms Rumler approx one week ago, and she came back to clinic reporting lower extremity swelling, it is non-pitting and symmetric, we did b/l LE [place] which were negative for DVTs, I advised her to contact your office for evaluation and she requested I contact you.  Do you mind reaching out to her? Thank you!  [name], NP [facility]  [phone]

## 2020-06-26 NOTE — Telephone Encounter (Signed)
Tried to call patient 2 times this morning. VM is not set up and could not leave message

## 2020-06-26 NOTE — Telephone Encounter (Signed)
Casey [name], will you find out how her leg swelling is doing? If she's still having problems then have her scheduled to see me.  Thanks!

## 2020-06-29 ENCOUNTER — Other Ambulatory Visit: Payer: Medicare HMO

## 2020-06-30 DIAGNOSIS — R600 Localized edema: Secondary | ICD-10-CM | POA: Diagnosis not present

## 2020-06-30 DIAGNOSIS — M4316 Spondylolisthesis, lumbar region: Secondary | ICD-10-CM | POA: Diagnosis not present

## 2020-06-30 DIAGNOSIS — M7120 Synovial cyst of popliteal space [Baker], unspecified knee: Secondary | ICD-10-CM | POA: Diagnosis not present

## 2020-06-30 DIAGNOSIS — M48062 Spinal stenosis, lumbar region with neurogenic claudication: Secondary | ICD-10-CM | POA: Diagnosis not present

## 2020-06-30 DIAGNOSIS — M810 Age-related osteoporosis without current pathological fracture: Secondary | ICD-10-CM | POA: Diagnosis not present

## 2020-06-30 DIAGNOSIS — Z981 Arthrodesis status: Secondary | ICD-10-CM | POA: Diagnosis not present

## 2020-06-30 DIAGNOSIS — Z4789 Encounter for other orthopedic aftercare: Secondary | ICD-10-CM | POA: Diagnosis not present

## 2020-06-30 DIAGNOSIS — M1990 Unspecified osteoarthritis, unspecified site: Secondary | ICD-10-CM | POA: Diagnosis not present

## 2020-06-30 DIAGNOSIS — M069 Rheumatoid arthritis, unspecified: Secondary | ICD-10-CM | POA: Diagnosis not present

## 2020-06-30 DIAGNOSIS — M76899 Other specified enthesopathies of unspecified lower limb, excluding foot: Secondary | ICD-10-CM | POA: Diagnosis not present

## 2020-06-30 NOTE — Telephone Encounter (Signed)
Patient schedule an appointment on 07/01/2020

## 2020-07-01 ENCOUNTER — Ambulatory Visit (INDEPENDENT_AMBULATORY_CARE_PROVIDER_SITE_OTHER): Payer: Medicare HMO | Admitting: Primary Care

## 2020-07-01 ENCOUNTER — Encounter: Payer: Self-pay | Admitting: Primary Care

## 2020-07-01 ENCOUNTER — Other Ambulatory Visit: Payer: Self-pay

## 2020-07-01 VITALS — BP 130/78 | HR 71 | Temp 96.5°F | Ht 64.0 in | Wt 160.5 lb

## 2020-07-01 DIAGNOSIS — R600 Localized edema: Secondary | ICD-10-CM | POA: Diagnosis not present

## 2020-07-01 LAB — BRAIN NATRIURETIC PEPTIDE: Pro B Natriuretic peptide (BNP): 150 pg/mL — ABNORMAL HIGH (ref 0.0–100.0)

## 2020-07-01 LAB — URIC ACID: Uric Acid, Serum: 4.5 mg/dL (ref 2.4–7.0)

## 2020-07-01 NOTE — Progress Notes (Signed)
Subjective:    Patient ID: Casey [name], female    DOB: 05/24/1946, 74 y.o.   MRN: [id number]  HPI  This visit occurred during the SARS-CoV-2 public health emergency.  Safety protocols were in place, including screening questions prior to the visit, additional usage of staff PPE, and extensive cleaning of exam room while observing appropriate contact time as indicated for disinfecting solutions.   Casey [name] is a 74 year old female with a history of rheumatoid arthritis, chronic back pain, osteoarthritis who presents today with a chief complaint of lower extremity swelling.  She underwent lower back surgery on 06/17/20 and since then has noticed increased swelling to her bilateral lower extremities. The swelling extends from her feet up to her her buttocks. She's actually had chronic bilateral lower extremity swelling extending up to her buttocks, but now it's worse.   She notified her surgeon of her swelling last week, underwent bilateral venous ultrasound on 06/24/20 which was negative for DVT.   She has been off of her Humira and Etodolac. She was told yesterday to take a dose of her prednisone 5 mg, she did do this. She is more sedentary since her surgery, is sitting in her recliner with legs elevated somewhat. She has compression stockings, has not worn them in one week. She just started her home exercises yesterday.   She denies shortness of breath, chest pain.   Review of Systems  Respiratory: Negative for cough and shortness of breath.   Cardiovascular: Positive for leg swelling. Negative for chest pain.  Musculoskeletal: Positive for back pain.  Skin: Negative for color change.       Past Medical History:  Diagnosis Date  . Allergy   . Chronic back pain   . History of kidney stones   . Osteoporosis   . Rheumatoid arthritis (HCC)      Social History   Socioeconomic History  . Marital status: Widowed    Spouse name: Not on file  . Number of children: Not on file  .  Years of education: Not on file  . Highest education level: Not on file  Occupational History  . Not on file  Tobacco Use  . Smoking status: Never Smoker  . Smokeless tobacco: Never Used  Vaping Use  . Vaping Use: Never used  Substance and Sexual Activity  . Alcohol use: Never  . Drug use: Never  . Sexual activity: Not on file  Other Topics Concern  . Not on file  Social History Narrative  . Not on file   Social Determinants of Health   Financial Resource Strain: Low Risk   . Difficulty of Paying Living Expenses: Not hard at all  Food Insecurity: No Food Insecurity  . Worried About Programme researcher, broadcasting/film/video in the Last Year: Never true  . Ran Out of Food in the Last Year: Never true  Transportation Needs: No Transportation Needs  . Lack of Transportation (Medical): No  . Lack of Transportation (Non-Medical): No  Physical Activity: Inactive  . Days of Exercise per Week: 0 days  . Minutes of Exercise per Session: 0 min  Stress: No Stress Concern Present  . Feeling of Stress : Not at all  Social Connections:   . Frequency of Communication with Friends and Family:   . Frequency of Social Gatherings with Friends and Family:   . Attends Religious Services:   . Active Member of Clubs or Organizations:   . Attends Banker Meetings:   .  Marital Status:   Intimate Partner Violence: Not At Risk  . Fear of Current or Ex-Partner: No  . Emotionally Abused: No  . Physically Abused: No  . Sexually Abused: No    Past Surgical History:  Procedure Laterality Date  . ANTERIOR LATERAL LUMBAR FUSION WITH PERCUTANEOUS SCREW 1 LEVEL N/A 06/17/2020   Procedure: L4-5 EXTREME LATERAL INTERBODY FUSION(XLIF)/POSTERIOR SPINAL FUSION (PSF);  Surgeon: Venetia Night, MD;  Location: [facility];  Service: Neurosurgery;  Laterality: N/A;  . EPIDURAL BLOCK INJECTION    . great toe Left    straightened and pinned  . LUMBAR LAMINECTOMY/DECOMPRESSION MICRODISCECTOMY Right 06/17/2020    Procedure: RIGHT L2-3 MICRODISCECTOMY;  Surgeon: Venetia Night, MD;  Location: [facility];  Service: Neurosurgery;  Laterality: Right;  . NECK SURGERY    . ROTATOR CUFF REPAIR Right     Family History  Problem Relation Age of Onset  . Heart attack Mother   . Mental illness Mother   . Bone cancer Brother   . Heart attack Brother     Allergies  Allergen Reactions  . Leflunomide Other (See Comments)    GI upset    . Methotrexate Other (See Comments)    GI upset    Current Outpatient Medications on File Prior to Visit  Medication Sig Dispense Refill  . acetaminophen (TYLENOL) 500 MG tablet Take 2 tablets (1,000 mg total) by mouth every 6 (six) hours as needed for mild pain. 30 tablet 0  . Adalimumab 40 MG/0.8ML PNKT Inject 0.8 mLs into the skin every 14 (fourteen) days.    Marland Kitchen alendronate (FOSAMAX) 70 MG tablet Take 70 mg by mouth every Saturday.     . Ascorbic Acid (VITAMIN C) 1000 MG tablet Take 1,000 mg by mouth daily.    . celecoxib (CELEBREX) 100 MG capsule Take 1 capsule (100 mg total) by mouth 2 (two) times daily. 60 capsule 1  . cholecalciferol (VITAMIN D) 25 MCG (1000 UNIT) tablet Take 1,000 Units by mouth daily.    Marland Kitchen ELDERBERRY PO Take 1 tablet by mouth daily.    . Melatonin 10 MG TABS Take 10 mg by mouth at bedtime as needed (sleep.).    Marland Kitchen methocarbamol (ROBAXIN) 500 MG tablet Take 1.5 tablets (750 mg total) by mouth every 6 (six) hours. 180 tablet 0  . Multiple Vitamin (MULTIVITAMIN WITH MINERALS) TABS tablet Take 1 tablet by mouth daily. Centrum Silver    . Omega-3 Fatty Acids (FISH OIL) 1200 MG CAPS Take 1,200 mg by mouth daily.    . polyethylene glycol (MIRALAX / GLYCOLAX) 17 g packet Take 17 g by mouth daily as needed for mild constipation. 14 each 0  . predniSONE (DELTASONE) 5 MG tablet Take 2.5 mg by mouth daily.    . pregabalin (LYRICA) 50 MG capsule Take 100 mg by mouth in the morning and at bedtime.    . senna (SENOKOT) 8.6 MG TABS tablet Take 1 tablet (8.6 mg  total) by mouth 2 (two) times daily. 120 tablet 0  . Teriparatide, Recombinant, (FORTEO [place]) Inject 20 mcg into the skin daily.    Marland Kitchen lidocaine (LIDODERM) 5 % Place 1 patch onto the skin daily as needed (pain).  (Patient not taking: Reported on 07/01/2020)     No current facility-administered medications on file prior to visit.    BP 130/78   Pulse 71   Temp (!) 96.5 F (35.8 C) (Temporal)   Ht 5\' 4"  (1.626 m)   Wt 160 lb 8 oz (72.8 kg)  SpO2 97%   BMI 27.55 kg/m    Objective:   Physical Exam Cardiovascular:     Rate and Rhythm: Normal rate and regular rhythm.     Pulses:          Dorsalis pedis pulses are 2+ on the right side and 2+ on the left side.       Posterior tibial pulses are 2+ on the right side and 2+ on the left side.     Comments: Mild, bilateral, generalized lower extremity swelling without pitting or erythema. Normal pedal pulses. Moderate left ankle swelling. Pulmonary:     Effort: Pulmonary effort is normal.     Breath sounds: Normal breath sounds.  Musculoskeletal:     Cervical back: Neck supple.  Skin:    General: Skin is warm and dry.            Assessment & Plan:

## 2020-07-01 NOTE — Patient Instructions (Addendum)
Stop by the lab prior to leaving today. I will notify you of your results once received.   Elevate your legs as high as you can when resting.  Avoid sitting for too long, be sure to get up and walk around when possible.  Wear your compression stockings.  It was a pleasure to see you today!

## 2020-07-01 NOTE — Assessment & Plan Note (Signed)
Acute on chronic. Negative bilateral venous ultrasound, viewed in chart. Normal pedal pulses, no pitting on exam.  Suspect increased edema is secondary to recent surgery. She is also more sedentary and is not wearing compression stockings.  Checking labs today including uric acid and BNP. Discussed to get up at least every hour for walking. Elevate legs when resting, wear compression stockings.  Return precautions provided.

## 2020-07-02 ENCOUNTER — Other Ambulatory Visit: Payer: Self-pay | Admitting: Primary Care

## 2020-07-02 ENCOUNTER — Telehealth: Payer: Self-pay | Admitting: Primary Care

## 2020-07-02 DIAGNOSIS — R600 Localized edema: Secondary | ICD-10-CM

## 2020-07-02 MED ORDER — FUROSEMIDE 20 MG PO TABS: ORAL_TABLET | ORAL | 0 refills | Status: DC

## 2020-07-02 NOTE — Telephone Encounter (Signed)
Inform patient that Mayra Reel was seeing patient this morning and would not been able to sent the Rx until after she done with patient. Inform patient to check pharmacy later today.

## 2020-07-02 NOTE — Telephone Encounter (Signed)
Patient called office stating her pharmacy hasnt received refill medication for fluid in knee. Please call patient when refill is made [phone].

## 2020-07-02 NOTE — Telephone Encounter (Signed)
Patient stated she needs refill sent asap.

## 2020-07-03 DIAGNOSIS — M1990 Unspecified osteoarthritis, unspecified site: Secondary | ICD-10-CM | POA: Diagnosis not present

## 2020-07-03 DIAGNOSIS — Z981 Arthrodesis status: Secondary | ICD-10-CM | POA: Diagnosis not present

## 2020-07-03 DIAGNOSIS — M7120 Synovial cyst of popliteal space [Baker], unspecified knee: Secondary | ICD-10-CM | POA: Diagnosis not present

## 2020-07-03 DIAGNOSIS — Z4789 Encounter for other orthopedic aftercare: Secondary | ICD-10-CM | POA: Diagnosis not present

## 2020-07-03 DIAGNOSIS — M069 Rheumatoid arthritis, unspecified: Secondary | ICD-10-CM | POA: Diagnosis not present

## 2020-07-03 DIAGNOSIS — M76899 Other specified enthesopathies of unspecified lower limb, excluding foot: Secondary | ICD-10-CM | POA: Diagnosis not present

## 2020-07-03 DIAGNOSIS — R600 Localized edema: Secondary | ICD-10-CM | POA: Diagnosis not present

## 2020-07-03 DIAGNOSIS — M4316 Spondylolisthesis, lumbar region: Secondary | ICD-10-CM | POA: Diagnosis not present

## 2020-07-03 DIAGNOSIS — M810 Age-related osteoporosis without current pathological fracture: Secondary | ICD-10-CM | POA: Diagnosis not present

## 2020-07-03 DIAGNOSIS — M48062 Spinal stenosis, lumbar region with neurogenic claudication: Secondary | ICD-10-CM | POA: Diagnosis not present

## 2020-07-06 ENCOUNTER — Other Ambulatory Visit: Payer: Self-pay | Admitting: Student in an Organized Health Care Education/Training Program

## 2020-07-06 DIAGNOSIS — Z4789 Encounter for other orthopedic aftercare: Secondary | ICD-10-CM | POA: Diagnosis not present

## 2020-07-06 DIAGNOSIS — M810 Age-related osteoporosis without current pathological fracture: Secondary | ICD-10-CM | POA: Diagnosis not present

## 2020-07-06 DIAGNOSIS — M76899 Other specified enthesopathies of unspecified lower limb, excluding foot: Secondary | ICD-10-CM | POA: Diagnosis not present

## 2020-07-06 DIAGNOSIS — M48062 Spinal stenosis, lumbar region with neurogenic claudication: Secondary | ICD-10-CM | POA: Diagnosis not present

## 2020-07-06 DIAGNOSIS — Z981 Arthrodesis status: Secondary | ICD-10-CM | POA: Diagnosis not present

## 2020-07-06 DIAGNOSIS — M4316 Spondylolisthesis, lumbar region: Secondary | ICD-10-CM | POA: Diagnosis not present

## 2020-07-06 DIAGNOSIS — M1990 Unspecified osteoarthritis, unspecified site: Secondary | ICD-10-CM | POA: Diagnosis not present

## 2020-07-06 DIAGNOSIS — M7120 Synovial cyst of popliteal space [Baker], unspecified knee: Secondary | ICD-10-CM | POA: Diagnosis not present

## 2020-07-06 DIAGNOSIS — G894 Chronic pain syndrome: Secondary | ICD-10-CM

## 2020-07-06 DIAGNOSIS — M069 Rheumatoid arthritis, unspecified: Secondary | ICD-10-CM | POA: Diagnosis not present

## 2020-07-06 DIAGNOSIS — R600 Localized edema: Secondary | ICD-10-CM | POA: Diagnosis not present

## 2020-07-08 DIAGNOSIS — M810 Age-related osteoporosis without current pathological fracture: Secondary | ICD-10-CM | POA: Diagnosis not present

## 2020-07-08 DIAGNOSIS — M76899 Other specified enthesopathies of unspecified lower limb, excluding foot: Secondary | ICD-10-CM | POA: Diagnosis not present

## 2020-07-08 DIAGNOSIS — Z4789 Encounter for other orthopedic aftercare: Secondary | ICD-10-CM | POA: Diagnosis not present

## 2020-07-08 DIAGNOSIS — R600 Localized edema: Secondary | ICD-10-CM | POA: Diagnosis not present

## 2020-07-08 DIAGNOSIS — M48062 Spinal stenosis, lumbar region with neurogenic claudication: Secondary | ICD-10-CM | POA: Diagnosis not present

## 2020-07-08 DIAGNOSIS — M7120 Synovial cyst of popliteal space [Baker], unspecified knee: Secondary | ICD-10-CM | POA: Diagnosis not present

## 2020-07-08 DIAGNOSIS — M4316 Spondylolisthesis, lumbar region: Secondary | ICD-10-CM | POA: Diagnosis not present

## 2020-07-08 DIAGNOSIS — M069 Rheumatoid arthritis, unspecified: Secondary | ICD-10-CM | POA: Diagnosis not present

## 2020-07-08 DIAGNOSIS — Z981 Arthrodesis status: Secondary | ICD-10-CM | POA: Diagnosis not present

## 2020-07-08 DIAGNOSIS — M1990 Unspecified osteoarthritis, unspecified site: Secondary | ICD-10-CM | POA: Diagnosis not present

## 2020-07-13 DIAGNOSIS — M810 Age-related osteoporosis without current pathological fracture: Secondary | ICD-10-CM | POA: Diagnosis not present

## 2020-07-13 DIAGNOSIS — M76899 Other specified enthesopathies of unspecified lower limb, excluding foot: Secondary | ICD-10-CM | POA: Diagnosis not present

## 2020-07-13 DIAGNOSIS — M7120 Synovial cyst of popliteal space [Baker], unspecified knee: Secondary | ICD-10-CM | POA: Diagnosis not present

## 2020-07-13 DIAGNOSIS — R600 Localized edema: Secondary | ICD-10-CM | POA: Diagnosis not present

## 2020-07-13 DIAGNOSIS — M48062 Spinal stenosis, lumbar region with neurogenic claudication: Secondary | ICD-10-CM | POA: Diagnosis not present

## 2020-07-13 DIAGNOSIS — Z981 Arthrodesis status: Secondary | ICD-10-CM | POA: Diagnosis not present

## 2020-07-13 DIAGNOSIS — Z4789 Encounter for other orthopedic aftercare: Secondary | ICD-10-CM | POA: Diagnosis not present

## 2020-07-13 DIAGNOSIS — M069 Rheumatoid arthritis, unspecified: Secondary | ICD-10-CM | POA: Diagnosis not present

## 2020-07-13 DIAGNOSIS — M1990 Unspecified osteoarthritis, unspecified site: Secondary | ICD-10-CM | POA: Diagnosis not present

## 2020-07-13 DIAGNOSIS — M4316 Spondylolisthesis, lumbar region: Secondary | ICD-10-CM | POA: Diagnosis not present

## 2020-07-16 DIAGNOSIS — Z4789 Encounter for other orthopedic aftercare: Secondary | ICD-10-CM | POA: Diagnosis not present

## 2020-07-16 DIAGNOSIS — Z981 Arthrodesis status: Secondary | ICD-10-CM | POA: Diagnosis not present

## 2020-07-16 DIAGNOSIS — M810 Age-related osteoporosis without current pathological fracture: Secondary | ICD-10-CM | POA: Diagnosis not present

## 2020-07-16 DIAGNOSIS — M7120 Synovial cyst of popliteal space [Baker], unspecified knee: Secondary | ICD-10-CM | POA: Diagnosis not present

## 2020-07-16 DIAGNOSIS — M069 Rheumatoid arthritis, unspecified: Secondary | ICD-10-CM | POA: Diagnosis not present

## 2020-07-16 DIAGNOSIS — M76899 Other specified enthesopathies of unspecified lower limb, excluding foot: Secondary | ICD-10-CM | POA: Diagnosis not present

## 2020-07-16 DIAGNOSIS — M48062 Spinal stenosis, lumbar region with neurogenic claudication: Secondary | ICD-10-CM | POA: Diagnosis not present

## 2020-07-16 DIAGNOSIS — R600 Localized edema: Secondary | ICD-10-CM | POA: Diagnosis not present

## 2020-07-16 DIAGNOSIS — M1990 Unspecified osteoarthritis, unspecified site: Secondary | ICD-10-CM | POA: Diagnosis not present

## 2020-07-16 DIAGNOSIS — M4316 Spondylolisthesis, lumbar region: Secondary | ICD-10-CM | POA: Diagnosis not present

## 2020-07-23 DIAGNOSIS — R293 Abnormal posture: Secondary | ICD-10-CM | POA: Diagnosis not present

## 2020-07-23 DIAGNOSIS — M4316 Spondylolisthesis, lumbar region: Secondary | ICD-10-CM | POA: Diagnosis not present

## 2020-07-23 DIAGNOSIS — M2560 Stiffness of unspecified joint, not elsewhere classified: Secondary | ICD-10-CM | POA: Diagnosis not present

## 2020-07-23 DIAGNOSIS — M4186 Other forms of scoliosis, lumbar region: Secondary | ICD-10-CM | POA: Diagnosis not present

## 2020-07-23 DIAGNOSIS — Z981 Arthrodesis status: Secondary | ICD-10-CM | POA: Diagnosis not present

## 2020-07-23 DIAGNOSIS — M4326 Fusion of spine, lumbar region: Secondary | ICD-10-CM | POA: Diagnosis not present

## 2020-07-23 DIAGNOSIS — R531 Weakness: Secondary | ICD-10-CM | POA: Diagnosis not present

## 2020-07-27 DIAGNOSIS — Z981 Arthrodesis status: Secondary | ICD-10-CM | POA: Diagnosis not present

## 2020-07-27 DIAGNOSIS — R69 Illness, unspecified: Secondary | ICD-10-CM | POA: Diagnosis not present

## 2020-07-30 DIAGNOSIS — Z981 Arthrodesis status: Secondary | ICD-10-CM | POA: Diagnosis not present

## 2020-08-06 DIAGNOSIS — M810 Age-related osteoporosis without current pathological fracture: Secondary | ICD-10-CM | POA: Diagnosis not present

## 2020-08-06 DIAGNOSIS — G629 Polyneuropathy, unspecified: Secondary | ICD-10-CM | POA: Diagnosis not present

## 2020-08-06 DIAGNOSIS — Z981 Arthrodesis status: Secondary | ICD-10-CM | POA: Diagnosis not present

## 2020-08-06 DIAGNOSIS — M0579 Rheumatoid arthritis with rheumatoid factor of multiple sites without organ or systems involvement: Secondary | ICD-10-CM | POA: Diagnosis not present

## 2020-08-06 DIAGNOSIS — R600 Localized edema: Secondary | ICD-10-CM | POA: Diagnosis not present

## 2020-08-06 DIAGNOSIS — Z79899 Other long term (current) drug therapy: Secondary | ICD-10-CM | POA: Diagnosis not present

## 2020-08-10 DIAGNOSIS — Z981 Arthrodesis status: Secondary | ICD-10-CM | POA: Diagnosis not present

## 2020-08-12 ENCOUNTER — Other Ambulatory Visit (HOSPITAL_COMMUNITY): Payer: Self-pay | Admitting: Neurosurgery

## 2020-08-12 ENCOUNTER — Other Ambulatory Visit: Payer: Self-pay | Admitting: Neurosurgery

## 2020-08-12 DIAGNOSIS — Z981 Arthrodesis status: Secondary | ICD-10-CM

## 2020-08-12 DIAGNOSIS — M5416 Radiculopathy, lumbar region: Secondary | ICD-10-CM

## 2020-08-13 DIAGNOSIS — Z981 Arthrodesis status: Secondary | ICD-10-CM | POA: Diagnosis not present

## 2020-08-17 ENCOUNTER — Other Ambulatory Visit: Payer: Self-pay

## 2020-08-17 ENCOUNTER — Ambulatory Visit
Admission: RE | Admit: 2020-08-17 | Discharge: 2020-08-17 | Disposition: A | Discharge status: Home / Self Care | Payer: Medicare HMO | Source: Ambulatory Visit | Attending: Neurosurgery | Admitting: Neurosurgery

## 2020-08-17 DIAGNOSIS — M48061 Spinal stenosis, lumbar region without neurogenic claudication: Secondary | ICD-10-CM | POA: Diagnosis not present

## 2020-08-17 DIAGNOSIS — M5126 Other intervertebral disc displacement, lumbar region: Secondary | ICD-10-CM | POA: Diagnosis not present

## 2020-08-17 DIAGNOSIS — M5416 Radiculopathy, lumbar region: Secondary | ICD-10-CM | POA: Insufficient documentation

## 2020-08-17 DIAGNOSIS — M4316 Spondylolisthesis, lumbar region: Secondary | ICD-10-CM | POA: Diagnosis not present

## 2020-08-17 DIAGNOSIS — Z981 Arthrodesis status: Secondary | ICD-10-CM

## 2020-08-24 DIAGNOSIS — Z981 Arthrodesis status: Secondary | ICD-10-CM | POA: Diagnosis not present

## 2020-08-28 ENCOUNTER — Ambulatory Visit (INDEPENDENT_AMBULATORY_CARE_PROVIDER_SITE_OTHER)
Admission: RE | Admit: 2020-08-28 | Discharge: 2020-08-28 | Disposition: A | Discharge status: Home / Self Care | Payer: Medicare HMO | Source: Ambulatory Visit | Attending: Primary Care | Admitting: Primary Care

## 2020-08-28 ENCOUNTER — Ambulatory Visit (INDEPENDENT_AMBULATORY_CARE_PROVIDER_SITE_OTHER): Payer: Medicare HMO | Admitting: Primary Care

## 2020-08-28 ENCOUNTER — Other Ambulatory Visit: Payer: Self-pay

## 2020-08-28 ENCOUNTER — Encounter: Payer: Self-pay | Admitting: Primary Care

## 2020-08-28 VITALS — BP 128/74 | HR 97 | Temp 98.6°F | Ht 64.0 in | Wt 159.0 lb

## 2020-08-28 DIAGNOSIS — M25551 Pain in right hip: Secondary | ICD-10-CM | POA: Diagnosis not present

## 2020-08-28 DIAGNOSIS — M549 Dorsalgia, unspecified: Secondary | ICD-10-CM | POA: Diagnosis not present

## 2020-08-28 DIAGNOSIS — M546 Pain in thoracic spine: Secondary | ICD-10-CM

## 2020-08-28 DIAGNOSIS — G8929 Other chronic pain: Secondary | ICD-10-CM

## 2020-08-28 DIAGNOSIS — M47816 Spondylosis without myelopathy or radiculopathy, lumbar region: Secondary | ICD-10-CM | POA: Diagnosis not present

## 2020-08-28 DIAGNOSIS — Z981 Arthrodesis status: Secondary | ICD-10-CM | POA: Diagnosis not present

## 2020-08-28 NOTE — Assessment & Plan Note (Signed)
Ambulates decently well in clinic. No obvious deformity.   Unclear if her current symptoms are related to her chronic pain and recent surgery. MRI reviewed.  We will check hip and thoracic back films today. Recommended to resume PT when possible. Follow up with neurosurgery.

## 2020-08-28 NOTE — Patient Instructions (Addendum)
Please send message with Covid information in a my chart message so we can update your chart.   Complete xray(s) prior to leaving today. I will notify you of your results once received.  Follow up with your neurosurgeon as scheduled.   It was a pleasure to see you today!

## 2020-08-28 NOTE — Assessment & Plan Note (Signed)
S/P lumbar fusion and microdiscectomy in July 2021, following with neurosurgery.  Unclear if her current symptoms are related to her chronic pain and recent surgery. MRI reviewed.  We will check hip and thoracic back films today. Recommended to resume PT when possible. Follow up with neurosurgery.

## 2020-08-28 NOTE — Progress Notes (Signed)
Subjective:    Patient ID: Casey [name], female    DOB: 1946/05/14, 74 y.o.   MRN: [id number]  HPI  This visit occurred during the SARS-CoV-2 public health emergency.  Safety protocols were in place, including screening questions prior to the visit, additional usage of staff PPE, and extensive cleaning of exam room while observing appropriate contact time as indicated for disinfecting solutions.   Casey [name] is a 74 year old female with a history of rheumatoid arthritis, chronic back pain with lumbar spondylosis, lumbar DDD, radiculopathy who presents today with a chief complaint of hip and back pain.  Currently following with neurosurgery, Dr. Myer Haff, underwent lumbar fusion with microdiscectomy on 06/17/20. Since her surgery in July 2021 her pain has remained. She has been active in physical therapy, until recently which is now on hold due to her ongoing symptoms.   Specifically she's noticed tightness and swelling to her bilateral lower extremities from her hips down to her ankles, mostly the thighs. This began prior to her surgery and feels that "something is moving in me". Her legs will feel like "rubber" and will sometimes draw up when laying down. She's continued to notice right lower back pain, pain into the buttocks, and dow to bilateral lower extremities. Also with right hip pain and thoracic back pain.   She is mostly concerned about her buttocks pain when sitting, thigh pain when sitting and laying, and radiation of pain from her buttocks up to the thoracic spine. Physical therapy helps some with the tightness and pain temporarily.   She underwent MRI on 09/20, was told that her symptoms were not coming from her back or recent surgery, so she was referred to her PCP. She has a follow up visit scheduled with neurosurgery next week.   She is following with rheumatology, recent visit in early September 2021, underwent full lab work up.  Review of Systems  Musculoskeletal: Positive  for arthralgias, back pain and myalgias.  Neurological: Negative for weakness and numbness.       Past Medical History:  Diagnosis Date  . Allergy   . Chronic back pain   . History of kidney stones   . Osteoporosis   . Rheumatoid arthritis (HCC)      Social History   Socioeconomic History  . Marital status: Widowed    Spouse name: Not on file  . Number of children: Not on file  . Years of education: Not on file  . Highest education level: Not on file  Occupational History  . Not on file  Tobacco Use  . Smoking status: Never Smoker  . Smokeless tobacco: Never Used  Vaping Use  . Vaping Use: Never used  Substance and Sexual Activity  . Alcohol use: Never  . Drug use: Never  . Sexual activity: Not on file  Other Topics Concern  . Not on file  Social History Narrative  . Not on file   Social Determinants of Health   Financial Resource Strain: Low Risk   . Difficulty of Paying Living Expenses: Not hard at all  Food Insecurity: No Food Insecurity  . Worried About Programme researcher, broadcasting/film/video in the Last Year: Never true  . Ran Out of Food in the Last Year: Never true  Transportation Needs: No Transportation Needs  . Lack of Transportation (Medical): No  . Lack of Transportation (Non-Medical): No  Physical Activity: Inactive  . Days of Exercise per Week: 0 days  . Minutes of Exercise per Session: 0  min  Stress: No Stress Concern Present  . Feeling of Stress : Not at all  Social Connections:   . Frequency of Communication with Friends and Family: Not on file  . Frequency of Social Gatherings with Friends and Family: Not on file  . Attends Religious Services: Not on file  . Active Member of Clubs or Organizations: Not on file  . Attends Banker Meetings: Not on file  . Marital Status: Not on file  Intimate Partner Violence: Not At Risk  . Fear of Current or Ex-Partner: No  . Emotionally Abused: No  . Physically Abused: No  . Sexually Abused: No    Past  Surgical History:  Procedure Laterality Date  . ANTERIOR LATERAL LUMBAR FUSION WITH PERCUTANEOUS SCREW 1 LEVEL N/A 06/17/2020   Procedure: L4-5 EXTREME LATERAL INTERBODY FUSION(XLIF)/POSTERIOR SPINAL FUSION (PSF);  Surgeon: Venetia Night, MD;  Location: [facility];  Service: Neurosurgery;  Laterality: N/A;  . EPIDURAL BLOCK INJECTION    . great toe Left    straightened and pinned  . LUMBAR LAMINECTOMY/DECOMPRESSION MICRODISCECTOMY Right 06/17/2020   Procedure: RIGHT L2-3 MICRODISCECTOMY;  Surgeon: Venetia Night, MD;  Location: [facility];  Service: Neurosurgery;  Laterality: Right;  . NECK SURGERY    . ROTATOR CUFF REPAIR Right     Family History  Problem Relation Age of Onset  . Heart attack Mother   . Mental illness Mother   . Bone cancer Brother   . Heart attack Brother     Allergies  Allergen Reactions  . Leflunomide Other (See Comments)    GI upset    . Methotrexate Other (See Comments)    GI upset    Current Outpatient Medications on File Prior to Visit  Medication Sig Dispense Refill  . acetaminophen (TYLENOL) 500 MG tablet Take 2 tablets (1,000 mg total) by mouth every 6 (six) hours as needed for mild pain. 30 tablet 0  . Adalimumab 40 MG/0.8ML PNKT Inject 0.8 mLs into the skin every 14 (fourteen) days.    . Ascorbic Acid (VITAMIN C) 1000 MG tablet Take 1,000 mg by mouth daily.    . celecoxib (CELEBREX) 100 MG capsule Take 1 capsule by mouth 2 (two) times daily.    . cholecalciferol (VITAMIN D) 25 MCG (1000 UNIT) tablet Take 1,000 Units by mouth daily.    Marland Kitchen ELDERBERRY PO Take 1 tablet by mouth daily.    . furosemide (LASIX) 20 MG tablet Take 1 tablet by mouth once daily for swelling. 4 tablet 0  . gabapentin (NEURONTIN) 300 MG capsule Take by mouth.    . lidocaine (LIDODERM) 5 % Place 1 patch onto the skin daily as needed (pain).     . Melatonin 10 MG TABS Take 10 mg by mouth at bedtime as needed (sleep.).    Marland Kitchen methocarbamol (ROBAXIN) 500 MG tablet TAKE 1.5  TABLETS (750 MG TOTAL) BY MOUTH EVERY 6 (SIX) HOURS.    . Multiple Vitamin (MULTIVITAMIN WITH MINERALS) TABS tablet Take 1 tablet by mouth daily. Centrum Silver    . Omega-3 Fatty Acids (FISH OIL) 1200 MG CAPS Take 1,200 mg by mouth daily.    . polyethylene glycol (MIRALAX / GLYCOLAX) 17 g packet Take 17 g by mouth daily as needed for mild constipation. 14 each 0  . predniSONE (DELTASONE) 5 MG tablet Take 2.5 mg by mouth daily.    . pregabalin (LYRICA) 50 MG capsule Take 100 mg by mouth in the morning and at bedtime.    Marland Kitchen  senna (SENOKOT) 8.6 MG TABS tablet Take 1 tablet (8.6 mg total) by mouth 2 (two) times daily. 120 tablet 0  . Teriparatide, Recombinant, (FORTEO [place]) Inject 20 mcg into the skin daily.     No current facility-administered medications on file prior to visit.    BP 128/74   Pulse 97   Temp 98.6 F (37 C) (Temporal)   Ht 5\' 4"  (1.626 m)   Wt 159 lb (72.1 kg)   SpO2 98%   BMI 27.29 kg/m    Objective:   Physical Exam Pulmonary:     Effort: Pulmonary effort is normal.  Musculoskeletal:     Thoracic back: No spasms or bony tenderness. Normal range of motion.     Lumbar back: No bony tenderness. Decreased range of motion.       Back:  Skin:    General: Skin is warm and dry.  Neurological:     Mental Status: She is alert.            Assessment & Plan:

## 2020-08-28 NOTE — Assessment & Plan Note (Addendum)
Unclear if her current symptoms are related to her chronic pain and recent surgery. MRI reviewed. No tenderness on exam.   We will check hip and thoracic back films today. Recommended to resume PT when possible. Follow up with neurosurgery.

## 2020-08-31 ENCOUNTER — Ambulatory Visit: Payer: Medicare HMO

## 2020-09-02 ENCOUNTER — Other Ambulatory Visit: Payer: Self-pay | Admitting: Primary Care

## 2020-09-03 DIAGNOSIS — M419 Scoliosis, unspecified: Secondary | ICD-10-CM | POA: Diagnosis not present

## 2020-09-03 DIAGNOSIS — Z981 Arthrodesis status: Secondary | ICD-10-CM | POA: Diagnosis not present

## 2020-09-03 DIAGNOSIS — M4326 Fusion of spine, lumbar region: Secondary | ICD-10-CM | POA: Diagnosis not present

## 2020-09-03 DIAGNOSIS — M5136 Other intervertebral disc degeneration, lumbar region: Secondary | ICD-10-CM | POA: Diagnosis not present

## 2020-09-03 DIAGNOSIS — M4316 Spondylolisthesis, lumbar region: Secondary | ICD-10-CM | POA: Diagnosis not present

## 2020-09-07 DIAGNOSIS — G629 Polyneuropathy, unspecified: Secondary | ICD-10-CM | POA: Diagnosis not present

## 2020-09-07 DIAGNOSIS — M0579 Rheumatoid arthritis with rheumatoid factor of multiple sites without organ or systems involvement: Secondary | ICD-10-CM | POA: Diagnosis not present

## 2020-09-07 DIAGNOSIS — M810 Age-related osteoporosis without current pathological fracture: Secondary | ICD-10-CM | POA: Diagnosis not present

## 2020-09-15 ENCOUNTER — Other Ambulatory Visit: Payer: Medicare HMO

## 2020-09-22 ENCOUNTER — Ambulatory Visit: Payer: Medicare HMO | Admitting: Primary Care

## 2020-09-22 DIAGNOSIS — Z7189 Other specified counseling: Secondary | ICD-10-CM | POA: Diagnosis not present

## 2020-09-22 DIAGNOSIS — Z23 Encounter for immunization: Secondary | ICD-10-CM | POA: Diagnosis not present

## 2020-10-01 NOTE — Progress Notes (Signed)
Cancelled visit

## 2020-10-05 DIAGNOSIS — R69 Illness, unspecified: Secondary | ICD-10-CM | POA: Diagnosis not present

## 2020-10-08 ENCOUNTER — Other Ambulatory Visit: Payer: Self-pay | Admitting: Primary Care

## 2020-10-08 DIAGNOSIS — Z13220 Encounter for screening for lipoid disorders: Secondary | ICD-10-CM

## 2020-10-08 DIAGNOSIS — M069 Rheumatoid arthritis, unspecified: Secondary | ICD-10-CM

## 2020-10-09 ENCOUNTER — Other Ambulatory Visit: Payer: Self-pay

## 2020-10-09 ENCOUNTER — Ambulatory Visit (INDEPENDENT_AMBULATORY_CARE_PROVIDER_SITE_OTHER): Payer: Medicare HMO | Admitting: Primary Care

## 2020-10-09 VITALS — BP 122/66 | HR 93 | Temp 97.4°F | Ht 64.0 in | Wt 152.4 lb

## 2020-10-09 DIAGNOSIS — G8929 Other chronic pain: Secondary | ICD-10-CM

## 2020-10-09 DIAGNOSIS — M546 Pain in thoracic spine: Secondary | ICD-10-CM | POA: Diagnosis not present

## 2020-10-09 DIAGNOSIS — B029 Zoster without complications: Secondary | ICD-10-CM

## 2020-10-09 HISTORY — DX: Zoster without complications: B02.9

## 2020-10-09 MED ORDER — VALACYCLOVIR HCL 1 G PO TABS: 1000.0000 mg | ORAL_TABLET | Freq: Three times a day (TID) | ORAL | 0 refills | Status: DC

## 2020-10-09 MED ORDER — KETOROLAC TROMETHAMINE 60 MG/2ML IM SOLN
60.0000 mg | Freq: Once | INTRAMUSCULAR | Status: AC
  Administered 2020-10-09: 60 mg via INTRAMUSCULAR

## 2020-10-09 NOTE — Progress Notes (Signed)
Subjective:    Patient ID: Casey [name], female    DOB: Oct 08, 1946, 73 y.o.   MRN: [id number]  HPI  This visit occurred during the SARS-CoV-2 public health emergency.  Safety protocols were in place, including screening questions prior to the visit, additional usage of staff PPE, and extensive cleaning of exam room while observing appropriate contact time as indicated for disinfecting solutions.   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 She would also like an injection for chronic back and hip pain.   Her rash is located to the left lower extremity distally to the medial knee and just proximal to the medial knee.   Her rash began two weeks ago with small vesicles, bubbling, and red patches. Since then she's noticed the rash spread. She denies numbness/tingling, pain, itching. She has not completed the shingles vaccine. She's not taken anything OTC or applied anything OTC for symptoms.  No new lotions, detergents, soaps or shampoos. No new medicines, vitamins, supplements. No new pets. No recent outdoor exposure or poison ivy exposure. No bonfire or smoke exposure.  No recent motel or hotel stay or new beds.   No fevers/chills, oral lesions, new joint pains, tick bites, abdominal pain, nausea.   She would also like a "shot" for pain today. She has chronic lower back and hip pain, follows with neurosurgery and underwent lumbar fusion in Summer 2021. She has follow up scheduled but would like an injection for pain today.   BP Readings from Last 3 Encounters:  10/09/20 122/66  08/28/20 128/74  07/01/20 130/78     Review of Systems  Constitutional: Negative for chills and fever.  Musculoskeletal: Positive for arthralgias and back pain.  Skin: Positive for rash.  Neurological: Negative for headaches.         Past Medical History:  Diagnosis Date  . Allergy   . Chronic back pain   . History of kidney stones   . Osteoporosis   . Rheumatoid arthritis (HCC)      Social History   Socioeconomic History  . Marital status: Widowed    Spouse name: Not on file  . Number of children: Not on file  . Years of education: Not on file  . Highest education level: Not on file  Occupational History  . Not on file  Tobacco Use  . Smoking status: Never Smoker  . Smokeless tobacco: Never Used  Vaping Use  . Vaping Use: Never used  Substance and Sexual Activity  . Alcohol use: Never  . Drug use: Never  . Sexual activity: Not on file  Other Topics Concern  . Not on file  Social History Narrative  . Not on file   Social Determinants of Health   Financial Resource Strain: Low Risk   . Difficulty of Paying Living Expenses: Not hard at all  Food Insecurity: No Food Insecurity  . Worried About Programme researcher, broadcasting/film/video in the Last Year: Never true  . Ran Out of Food in the Last Year: Never true  Transportation Needs: No Transportation Needs  . Lack of Transportation (Medical): No  . Lack of Transportation (Non-Medical): No  Physical Activity: Inactive  . Days of Exercise per Week: 0 days  . Minutes of Exercise per Session: 0 min  Stress: No Stress Concern Present  . Feeling of Stress :  Not at all  Social Connections:   . Frequency of Communication with Friends and Family: Not on file  . Frequency of Social Gatherings with Friends and Family: Not on file  . Attends Religious Services: Not on file  . Active Member of Clubs or Organizations: Not on file  . Attends Banker Meetings: Not on file  . Marital Status: Not on file  Intimate Partner Violence: Not At Risk  . Fear of Current or Ex-Partner: No  . Emotionally Abused: No  . Physically Abused: No  . Sexually Abused: No    Past Surgical History:  Procedure Laterality Date  . ANTERIOR LATERAL LUMBAR FUSION WITH PERCUTANEOUS  SCREW 1 LEVEL N/A 06/17/2020   Procedure: L4-5 EXTREME LATERAL INTERBODY FUSION(XLIF)/POSTERIOR SPINAL FUSION (PSF);  Surgeon: Venetia Night, MD;  Location: [facility];  Service: Neurosurgery;  Laterality: N/A;  . EPIDURAL BLOCK INJECTION    . great toe Left    straightened and pinned  . LUMBAR LAMINECTOMY/DECOMPRESSION MICRODISCECTOMY Right 06/17/2020   Procedure: RIGHT L2-3 MICRODISCECTOMY;  Surgeon: Venetia Night, MD;  Location: [facility];  Service: Neurosurgery;  Laterality: Right;  . NECK SURGERY    . ROTATOR CUFF REPAIR Right     Family History  Problem Relation Age of Onset  . Heart attack Mother   . Mental illness Mother   . Bone cancer Brother   . Heart attack Brother     Allergies  Allergen Reactions  . Leflunomide Other (See Comments)    GI upset    . Methotrexate Other (See Comments)    GI upset    Current Outpatient Medications on File Prior to Visit  Medication Sig Dispense Refill  . acetaminophen (TYLENOL) 500 MG tablet Take 2 tablets (1,000 mg total) by mouth every 6 (six) hours as needed for mild pain. 30 tablet 0  . Adalimumab 40 MG/0.8ML PNKT Inject 0.8 mLs into the skin every 14 (fourteen) days.    . Ascorbic Acid (VITAMIN C) 1000 MG tablet Take 1,000 mg by mouth daily.    . cholecalciferol (VITAMIN D) 25 MCG (1000 UNIT) tablet Take 1,000 Units by mouth daily.    Marland Kitchen ELDERBERRY PO Take 1 tablet by mouth daily.    Marland Kitchen etodolac (LODINE) 400 MG tablet Take by mouth.    . gabapentin (NEURONTIN) 300 MG capsule Take by mouth.    . Melatonin 10 MG TABS Take 10 mg by mouth at bedtime as needed (sleep.).    Marland Kitchen methocarbamol (ROBAXIN) 500 MG tablet TAKE 1.5 TABLETS (750 MG TOTAL) BY MOUTH EVERY 6 (SIX) HOURS.    . Multiple Vitamin (MULTIVITAMIN WITH MINERALS) TABS tablet Take 1 tablet by mouth daily. Centrum Silver    . Omega-3 Fatty Acids (FISH OIL) 1200 MG CAPS Take 1,200 mg by mouth daily.    . polyethylene glycol (MIRALAX / GLYCOLAX) 17 g packet Take 17 g by  mouth daily as needed for mild constipation. 14 each 0  . predniSONE (DELTASONE) 5 MG tablet Take 2.5 mg by mouth daily.    Marland Kitchen senna (SENOKOT) 8.6 MG TABS tablet Take 1 tablet (8.6 mg total) by mouth 2 (two) times daily. 120 tablet 0  . Teriparatide, Recombinant, (FORTEO [place]) Inject 20 mcg into the skin daily.    Marland Kitchen alendronate (FOSAMAX) 70 MG tablet Take 70 mg by mouth once a week. Take with a full glass of water on an empty stomach. (Patient not taking: Reported on 10/09/2020)    . celecoxib (CELEBREX) 100 MG capsule  Take 1 capsule by mouth 2 (two) times daily. (Patient not taking: Reported on 10/09/2020)    . furosemide (LASIX) 20 MG tablet Take 1 tablet by mouth once daily for swelling. (Patient not taking: Reported on 10/09/2020) 4 tablet 0  . lidocaine (LIDODERM) 5 % Place 1 patch onto the skin daily as needed (pain).  (Patient not taking: Reported on 10/09/2020)    . pregabalin (LYRICA) 50 MG capsule Take 100 mg by mouth in the morning and at bedtime. (Patient not taking: Reported on 10/09/2020)    . tiZANidine (ZANAFLEX) 4 MG capsule Take 4 mg by mouth 3 (three) times daily. (Patient not taking: Reported on 10/09/2020)    . traMADol (ULTRAM) 50 MG tablet Take by mouth every 6 (six) hours as needed. (Patient not taking: Reported on 10/09/2020)     No current facility-administered medications on file prior to visit.    BP 122/66   Pulse 93   Temp (!) 97.4 F (36.3 C) (Temporal)   Ht 5\' 4"  (1.626 m)   Wt 152 lb 6.4 oz (69.1 kg)   SpO2 99%   BMI 26.16 kg/m    Objective:   Physical Exam Musculoskeletal:     Comments: Generalized decrease in ROM to lumbar spine. Ambulatory today with walking stick.   Skin:    General: Skin is warm.     Findings: Rash present.     Comments: Numerous small and larger intact vesicles with erythema to left medial lower extremity proximally and distally to medial knee.   Neurological:     Mental Status: She is alert.            Assessment & Plan:

## 2020-10-09 NOTE — Assessment & Plan Note (Signed)
Obvious on exam today. Discussed treatment and to keep vesicles intact. Discussed that if vesicles rupture and leak she is contagious.   Rx for Valtrex 1000 mg course sent to pharmacy. She will update.

## 2020-10-09 NOTE — Patient Instructions (Signed)
Start valacyclovir (Valtrex) 1000 mg for shingles. Take 1 tablet by mouth three times daily for 7 days.  Keep the vesicles intact, if they break open then you are contagious.   It was a pleasure to see you today!    Shingles  Shingles, which is also known as herpes zoster, is an infection that causes a painful skin rash and fluid-filled blisters. It is caused by a virus. Shingles only develops in people who:  Have had chickenpox.  Have been given a medicine to protect against chickenpox (have been vaccinated). Shingles is rare in this group. What are the causes? Shingles is caused by varicella-zoster virus (VZV). This is the same virus that causes chickenpox. After a person is exposed to VZV, the virus stays in the body in an inactive (dormant) state. Shingles develops if the virus is reactivated. This can happen many years after the first (initial) exposure to VZV. It is not known what causes this virus to be reactivated. What increases the risk? People who have had chickenpox or received the chickenpox vaccine are at risk for shingles. Shingles infection is more common in people who:  Are older than age 64.  Have a weakened disease-fighting system (immune system), such as people with: ? HIV. ? AIDS. ? Cancer.  Are taking medicines that weaken the immune system, such as transplant medicines.  Are experiencing a lot of stress. What are the signs or symptoms? Early symptoms of this condition include itching, tingling, and pain in an area on your skin. Pain may be described as burning, stabbing, or throbbing. A few days or weeks after early symptoms start, a painful red rash appears. The rash is usually on one side of the body and has a band-like or belt-like pattern. The rash eventually turns into fluid-filled blisters that break open, change into scabs, and dry up in about 2-3 weeks. At any time during the infection, you may also develop:  A fever.  Chills.  A headache.  An  upset stomach. How is this diagnosed? This condition is diagnosed with a skin exam. Skin or fluid samples may be taken from the blisters before a diagnosis is made. These samples are examined under a microscope or sent to a lab for testing. How is this treated? The rash may last for several weeks. There is not a specific cure for this condition. Your health care provider will probably prescribe medicines to help you manage pain, recover more quickly, and avoid long-term problems. Medicines may include:  Antiviral drugs.  Anti-inflammatory drugs.  Pain medicines.  Anti-itching medicines (antihistamines). If the area involved is on your face, you may be referred to a specialist, such as an eye doctor (ophthalmologist) or an ear, nose, and throat (ENT) doctor (otolaryngologist) to help you avoid eye problems, chronic pain, or disability. Follow these instructions at home: Medicines  Take over-the-counter and prescription medicines only as told by your health care provider.  Apply an anti-itch cream or numbing cream to the affected area as told by your health care provider. Relieving itching and discomfort   Apply cold, wet cloths (cold compresses) to the area of the rash or blisters as told by your health care provider.  Cool baths can be soothing. Try adding baking soda or dry oatmeal to the water to reduce itching. Do not bathe in hot water. Blister and rash care  Keep your rash covered with a loose bandage (dressing). Wear loose-fitting clothing to help ease the pain of material rubbing against the rash.  Keep your rash and blisters clean by washing the area with mild soap and cool water as told by your health care provider.  Check your rash every day for signs of infection. Check for: ? More redness, swelling, or pain. ? Fluid or blood. ? Warmth. ? Pus or a bad smell.  Do not scratch your rash or pick at your blisters. To help avoid scratching: ? Keep your fingernails clean  and cut short. ? Wear gloves or mittens while you sleep, if scratching is a problem. General instructions  Rest as told by your health care provider.  Keep all follow-up visits as told by your health care provider. This is important.  Wash your hands often with soap and water. If soap and water are not available, use hand sanitizer. Doing this lowers your chance of getting a bacterial skin infection.  Before your blisters change into scabs, your shingles infection can cause chickenpox in people who have never had it or have never been vaccinated against it. To prevent this from happening, avoid contact with other people, especially: ? Babies. ? Pregnant women. ? Children who have eczema. ? Elderly people who have transplants. ? People who have chronic illnesses, such as cancer or AIDS. Contact a health care provider if:  Your pain is not relieved with prescribed medicines.  Your pain does not get better after the rash heals.  You have signs of infection in the rash area, such as: ? More redness, swelling, or pain around the rash. ? Fluid or blood coming from the rash. ? The rash area feeling warm to the touch. ? Pus or a bad smell coming from the rash. Get help right away if:  The rash is on your face or nose.  You have facial pain, pain around your eye area, or loss of feeling on one side of your face.  You have difficulty seeing.  You have ear pain or have ringing in your ear.  You have a loss of taste.  Your condition gets worse. Summary  Shingles, which is also known as herpes zoster, is an infection that causes a painful skin rash and fluid-filled blisters.  This condition is diagnosed with a skin exam. Skin or fluid samples may be taken from the blisters and examined before the diagnosis is made.  Keep your rash covered with a loose bandage (dressing). Wear loose-fitting clothing to help ease the pain of material rubbing against the rash.  Before your blisters  change into scabs, your shingles infection can cause chickenpox in people who have never had it or have never been vaccinated against it. This information is not intended to replace advice given to you by your health care provider. Make sure you discuss any questions you have with your health care provider. Document Revised: 03/08/2019 Document Reviewed: 07/19/2017 Elsevier Patient Education  2020 [employer].

## 2020-10-09 NOTE — Assessment & Plan Note (Signed)
Injection of Toradol provided today as requested. Follows with neurosurgery.

## 2020-10-12 ENCOUNTER — Telehealth: Payer: Self-pay | Admitting: Primary Care

## 2020-10-12 NOTE — Telephone Encounter (Signed)
She needs to come back in for re-evaluation if she's worse. Can we get her in on 11/16?

## 2020-10-12 NOTE — Telephone Encounter (Signed)
Was seen in office for shingles

## 2020-10-12 NOTE — Telephone Encounter (Signed)
Called patient and scheduled for in office evaluation on 11/16.

## 2020-10-12 NOTE — Telephone Encounter (Signed)
Pt called stating her rash is getting worse  She stated she has rash all over body belly back shoulder face  On her scalp. She is wanting to know if there is anything stronger she could take.  She stated some are blistered and red and she is worried they are getting infected   [facility]

## 2020-10-13 ENCOUNTER — Encounter: Payer: Self-pay | Admitting: Primary Care

## 2020-10-13 ENCOUNTER — Ambulatory Visit (INDEPENDENT_AMBULATORY_CARE_PROVIDER_SITE_OTHER): Payer: Medicare HMO | Admitting: Primary Care

## 2020-10-13 ENCOUNTER — Other Ambulatory Visit: Payer: Self-pay

## 2020-10-13 VITALS — BP 120/68 | HR 118 | Temp 98.0°F | Ht 64.0 in | Wt 155.0 lb

## 2020-10-13 DIAGNOSIS — B029 Zoster without complications: Secondary | ICD-10-CM | POA: Diagnosis not present

## 2020-10-13 MED ORDER — PREDNISONE 10 MG PO TABS: ORAL_TABLET | ORAL | 0 refills | Status: DC

## 2020-10-13 NOTE — Assessment & Plan Note (Signed)
Very evident herpes zoster to left medial lower extremity. Interestingly, the spots on her trunk appear more like small pustules, no clustering.   Slight change to left medial herpes zoster since last visit, although treatment was delayed 2 weeks after onset of rash. According to research, antiviral treatment may be ineffective this far out.   She continues to be asymptomatic otherwise. Continue Valtrex course, she doesn't seem to be experiencing an allergic reaction to Valtrex.  Increase oral prednisone temporarily with a 30mg ,20mg ,10mg  taper over six days.  Strict return precautions provided.  She will call to update later this week. She is very stable for outpatient treatment.

## 2020-10-13 NOTE — Progress Notes (Signed)
Subjective:    Patient ID: Casey [name], female    DOB: 08-31-1946, 74 y.o.   MRN: [id number]  HPI  This visit occurred during the SARS-CoV-2 public health emergency.  Safety protocols were in place, including screening questions prior to the visit, additional usage of staff PPE, and extensive cleaning of exam room while observing appropriate contact time as indicated for disinfecting solutions.   Casey [name] is a 74 year old female with a history of chronic back pain, chronic hip pain, recent herpes zoster who presents today with a chief complaint of increased rash.   She was evaluated on 10/09/20 with a unilateral rash to her left lower extremity that represented herpes zoster (vescilces in single dermatome pattern). She was treated with Valtrex 1000 mg TID x 7 days.  Today she is here as her rash has "spread" and is now evident to her anterior and posterior trunk, left cheek. Her rash is more like "bumps" and are not painful or itchy. She noticed these one day after her visit in our office. She is compliant to her valacyclovir. Denies hives, wheezing, throat tightness.  She is managed on oral prednisone at 2.5 mg daily for chronic arthralgias. She continues to deny changes in soaps/detergents/food/medications (except for Valtrex).   Review of Systems  Constitutional: Negative for fever.  Respiratory: Negative for shortness of breath and wheezing.   Skin: Positive for rash.       Past Medical History:  Diagnosis Date  . Allergy   . Chronic back pain   . History of kidney stones   . Osteoporosis   . Rheumatoid arthritis (HCC)      Social History   Socioeconomic History  . Marital status: Widowed    Spouse name: Not on file  . Number of children: Not on file  . Years of education: Not on file  . Highest education level: Not on file  Occupational History  . Not on file  Tobacco Use  . Smoking status: Never Smoker  . Smokeless tobacco: Never Used  Vaping Use  . Vaping  Use: Never used  Substance and Sexual Activity  . Alcohol use: Never  . Drug use: Never  . Sexual activity: Not on file  Other Topics Concern  . Not on file  Social History Narrative  . Not on file   Social Determinants of Health   Financial Resource Strain: Low Risk   . Difficulty of Paying Living Expenses: Not hard at all  Food Insecurity: No Food Insecurity  . Worried About Programme researcher, broadcasting/film/video in the Last Year: Never true  . Ran Out of Food in the Last Year: Never true  Transportation Needs: No Transportation Needs  . Lack of Transportation (Medical): No  . Lack of Transportation (Non-Medical): No  Physical Activity: Inactive  . Days of Exercise per Week: 0 days  . Minutes of Exercise per Session: 0 min  Stress: No Stress Concern Present  . Feeling of Stress : Not at all  Social Connections:   . Frequency of Communication with Friends and Family: Not on file  . Frequency of Social Gatherings with Friends and Family: Not on file  . Attends Religious Services: Not on file  . Active Member of Clubs or Organizations: Not on file  . Attends Banker Meetings: Not on file  . Marital Status: Not on file  Intimate Partner Violence: Not At Risk  . Fear of Current or Ex-Partner: No  . Emotionally Abused: No  .  Physically Abused: No  . Sexually Abused: No    Past Surgical History:  Procedure Laterality Date  . ANTERIOR LATERAL LUMBAR FUSION WITH PERCUTANEOUS SCREW 1 LEVEL N/A 06/17/2020   Procedure: L4-5 EXTREME LATERAL INTERBODY FUSION(XLIF)/POSTERIOR SPINAL FUSION (PSF);  Surgeon: Venetia Night, MD;  Location: [facility];  Service: Neurosurgery;  Laterality: N/A;  . EPIDURAL BLOCK INJECTION    . great toe Left    straightened and pinned  . LUMBAR LAMINECTOMY/DECOMPRESSION MICRODISCECTOMY Right 06/17/2020   Procedure: RIGHT L2-3 MICRODISCECTOMY;  Surgeon: Venetia Night, MD;  Location: [facility];  Service: Neurosurgery;  Laterality: Right;  . NECK SURGERY      . ROTATOR CUFF REPAIR Right     Family History  Problem Relation Age of Onset  . Heart attack Mother   . Mental illness Mother   . Bone cancer Brother   . Heart attack Brother     Allergies  Allergen Reactions  . Leflunomide Other (See Comments)    GI upset    . Methotrexate Other (See Comments)    GI upset    Current Outpatient Medications on File Prior to Visit  Medication Sig Dispense Refill  . acetaminophen (TYLENOL) 500 MG tablet Take 2 tablets (1,000 mg total) by mouth every 6 (six) hours as needed for mild pain. 30 tablet 0  . Adalimumab 40 MG/0.8ML PNKT Inject 0.8 mLs into the skin every 14 (fourteen) days.    Marland Kitchen alendronate (FOSAMAX) 70 MG tablet Take 70 mg by mouth once a week. Take with a full glass of water on an empty stomach.    . Ascorbic Acid (VITAMIN C) 1000 MG tablet Take 1,000 mg by mouth daily.    . celecoxib (CELEBREX) 100 MG capsule Take 1 capsule by mouth 2 (two) times daily.     . cholecalciferol (VITAMIN D) 25 MCG (1000 UNIT) tablet Take 1,000 Units by mouth daily.    Marland Kitchen ELDERBERRY PO Take 1 tablet by mouth daily.    Marland Kitchen etodolac (LODINE) 400 MG tablet Take by mouth.    . furosemide (LASIX) 20 MG tablet Take 1 tablet by mouth once daily for swelling. 4 tablet 0  . gabapentin (NEURONTIN) 300 MG capsule Take by mouth.    . lidocaine (LIDODERM) 5 % Place 1 patch onto the skin daily as needed (pain).     . Melatonin 10 MG TABS Take 10 mg by mouth at bedtime as needed (sleep.).    Marland Kitchen methocarbamol (ROBAXIN) 500 MG tablet TAKE 1.5 TABLETS (750 MG TOTAL) BY MOUTH EVERY 6 (SIX) HOURS.    . Multiple Vitamin (MULTIVITAMIN WITH MINERALS) TABS tablet Take 1 tablet by mouth daily. Centrum Silver    . Omega-3 Fatty Acids (FISH OIL) 1200 MG CAPS Take 1,200 mg by mouth daily.    . polyethylene glycol (MIRALAX / GLYCOLAX) 17 g packet Take 17 g by mouth daily as needed for mild constipation. 14 each 0  . predniSONE (DELTASONE) 5 MG tablet Take 2.5 mg by mouth daily.    .  pregabalin (LYRICA) 50 MG capsule Take 100 mg by mouth in the morning and at bedtime.     . senna (SENOKOT) 8.6 MG TABS tablet Take 1 tablet (8.6 mg total) by mouth 2 (two) times daily. 120 tablet 0  . Teriparatide, Recombinant, (FORTEO [place]) Inject 20 mcg into the skin daily.    Marland Kitchen tiZANidine (ZANAFLEX) 4 MG capsule Take 4 mg by mouth 3 (three) times daily.     . traMADol (ULTRAM) 50  MG tablet Take by mouth every 6 (six) hours as needed.     . valACYclovir (VALTREX) 1000 MG tablet Take 1 tablet (1,000 mg total) by mouth 3 (three) times daily. 21 tablet 0   No current facility-administered medications on file prior to visit.    BP 120/68   Pulse (!) 118   Temp 98 F (36.7 C) (Temporal)   Ht 5\' 4"  (1.626 m)   Wt 155 lb (70.3 kg)   SpO2 96%   BMI 26.61 kg/m     Objective:   Physical Exam Pulmonary:     Effort: Pulmonary effort is normal.  Skin:    General: Skin is warm.     Findings: Rash present.     Comments: Large and small vesicles still evident to left medial lower extremity, some intact, few not intact.   Numerous smaller pustule type spots to anterior and posterior trunk, some intact some not. No clustering of pustules.   No hives, surrounding erythema, tenderness.   Neurological:     Mental Status: She is alert.            Assessment & Plan:

## 2020-10-13 NOTE — Patient Instructions (Signed)
Continue taking valacyclovir 1000 mg three times daily until gone.  We have added prednisone 10 mg. Take 3 tablets daily for two days, then 2 tablets daily for two days, then 1 tablet daily for two days.  Please update me Friday this week.  It was a pleasure to see you today!

## 2020-10-14 IMAGING — DX DG CHEST 1V PORT
1 series · 1 of 1 positions shown · non-contrast
Comparison: March 09, 2020

CLINICAL DATA: Leg swelling.  Evaluate for edema.

EXAM:
PORTABLE CHEST 1 VIEW

[chest ap]
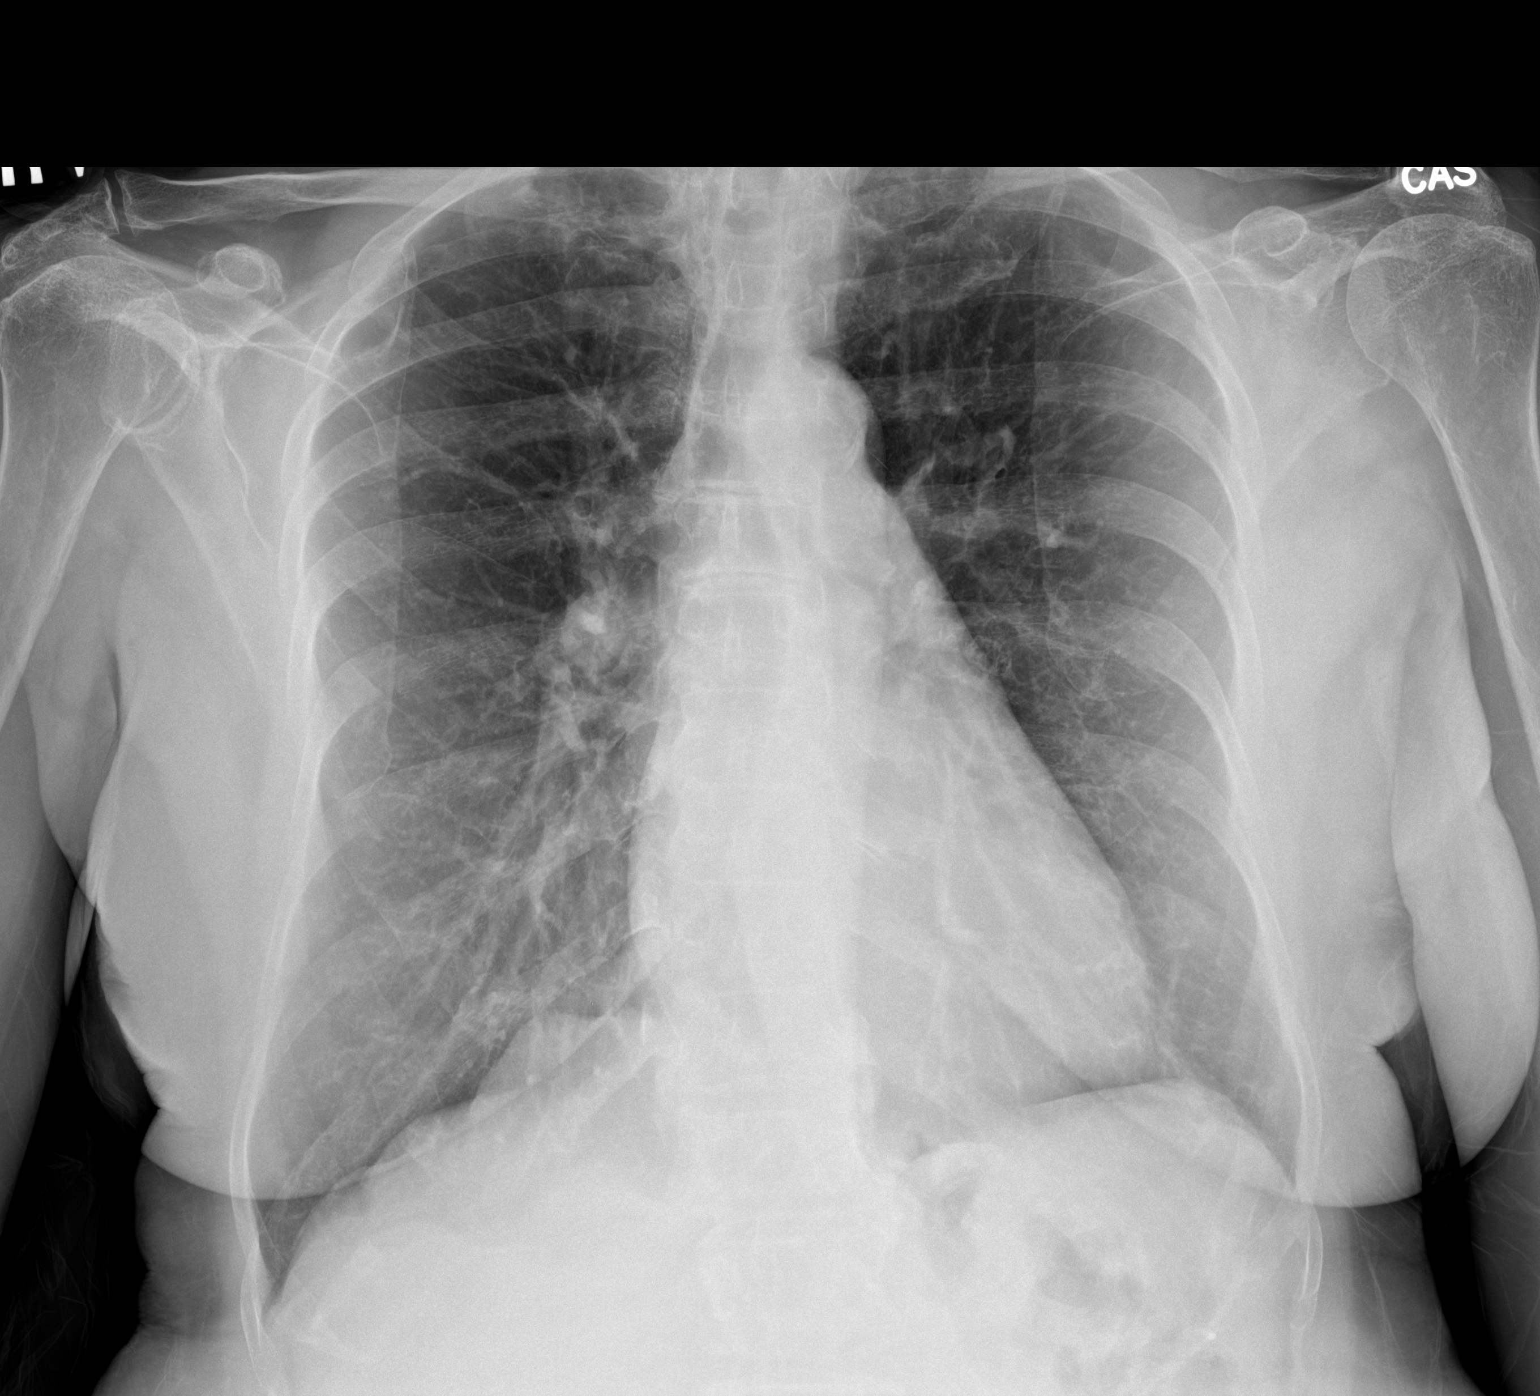

[1 of 1 positions shown; findings below may reference images not displayed]

FINDINGS: Cardiomediastinal silhouette is normal. Mediastinal contours appear
intact. Tortuosity and calcific atherosclerotic disease of the
aorta.

There is no evidence of focal airspace consolidation, pleural
effusion or pneumothorax. No evidence of pulmonary edema.

Osseous structures are without acute abnormality. Soft tissues are
grossly normal.
IMPRESSION: No active disease.

## 2020-10-16 ENCOUNTER — Other Ambulatory Visit: Payer: Medicare HMO

## 2020-10-16 ENCOUNTER — Other Ambulatory Visit: Payer: Self-pay | Admitting: Primary Care

## 2020-10-16 ENCOUNTER — Telehealth: Payer: Self-pay | Admitting: Primary Care

## 2020-10-16 DIAGNOSIS — B029 Zoster without complications: Secondary | ICD-10-CM

## 2020-10-16 NOTE — Telephone Encounter (Addendum)
Spoke with Jae Dire, she is going to research this further and get back with patient on Monday.  In the meantime, patient can take 1 extra Gabapentin at 1 time per day when her pain is at its worse.   Patient says that it is fine, she does feel like lesions are scabbing over and improving.  She is going to stop the gabapentin because she thinks it makes her feet feel weird.  She wants to see if Tylenol will be enough and knows that we will follow up with her first of next week.   Patient aware.

## 2020-10-16 NOTE — Telephone Encounter (Signed)
We cannot provide any additional Valtrex for her Shingles as she's had the recommended amount. Is she getting any better? Did she finish the prednisone?

## 2020-10-16 NOTE — Telephone Encounter (Signed)
Please advise 

## 2020-10-16 NOTE — Telephone Encounter (Signed)
still having shingles outbreak (blisters) Requesting  Valacyclovir medication refill.   [facility] phone # is [phone] EM

## 2020-10-16 NOTE — Telephone Encounter (Signed)
Spoke with patient, she reports that although she does feel like she is getting better she still has the following residual symptoms:  1.  Tightness around her waist area 2.  Groin lesions that are uncomfortable and feel swollen 3.   Some cramping in feet/leg on the left effected side denies any calf pain, swelling or tenderness.  She is taking gabapentin 300 mg tid and Extra strength Tylenol. This helps some but she state wears off quickly. Also, still has a few more days of prednisone.    Please let me know if anything further for patient at this point.

## 2020-10-18 NOTE — Telephone Encounter (Signed)
Will you check on patient 11/22? 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 I see that she is scheduled to see me on 11/24, make sure she keeps this appointment. I want to see her rash.

## 2020-10-19 NOTE — Telephone Encounter (Signed)
Noted, will evaluate on 11/24.

## 2020-10-19 NOTE — Telephone Encounter (Signed)
Called patient has not had much change in them. She will make sure that she keeps upcomming appointment. No questions at this time. Does feel like they are drying up some now.

## 2020-10-21 ENCOUNTER — Other Ambulatory Visit: Payer: Self-pay

## 2020-10-21 ENCOUNTER — Ambulatory Visit (INDEPENDENT_AMBULATORY_CARE_PROVIDER_SITE_OTHER): Payer: Medicare HMO | Admitting: Primary Care

## 2020-10-21 ENCOUNTER — Encounter: Payer: Self-pay | Admitting: Primary Care

## 2020-10-21 VITALS — BP 132/72 | HR 99 | Temp 98.4°F | Ht 64.0 in | Wt 155.0 lb

## 2020-10-21 DIAGNOSIS — Z13220 Encounter for screening for lipoid disorders: Secondary | ICD-10-CM

## 2020-10-21 DIAGNOSIS — M069 Rheumatoid arthritis, unspecified: Secondary | ICD-10-CM

## 2020-10-21 DIAGNOSIS — G894 Chronic pain syndrome: Secondary | ICD-10-CM

## 2020-10-21 DIAGNOSIS — Z0001 Encounter for general adult medical examination with abnormal findings: Secondary | ICD-10-CM | POA: Insufficient documentation

## 2020-10-21 DIAGNOSIS — Z0000 Encounter for general adult medical examination without abnormal findings: Secondary | ICD-10-CM

## 2020-10-21 DIAGNOSIS — R600 Localized edema: Secondary | ICD-10-CM | POA: Diagnosis not present

## 2020-10-21 DIAGNOSIS — M170 Bilateral primary osteoarthritis of knee: Secondary | ICD-10-CM | POA: Diagnosis not present

## 2020-10-21 DIAGNOSIS — Z6825 Body mass index (BMI) 25.0-25.9, adult: Secondary | ICD-10-CM | POA: Diagnosis not present

## 2020-10-21 DIAGNOSIS — B029 Zoster without complications: Secondary | ICD-10-CM | POA: Diagnosis not present

## 2020-10-21 DIAGNOSIS — M5136 Other intervertebral disc degeneration, lumbar region: Secondary | ICD-10-CM

## 2020-10-21 DIAGNOSIS — M810 Age-related osteoporosis without current pathological fracture: Secondary | ICD-10-CM | POA: Diagnosis not present

## 2020-10-21 NOTE — Assessment & Plan Note (Signed)
Chronic, overall stable today. She is not taking anything regularly for pain, using etodolac and Celebrex as needed, alternating. Discussed to avoid taking these together.

## 2020-10-21 NOTE — Patient Instructions (Addendum)
Stop by the lab prior to leaving today. I will notify you of your results once received.   Do not take these medications together:  pregabalin (Lyrica) and gabapentin (Neurontin)  Celecoxib (Celebrex) and etodolac (Lodine)  Tizanidine (Zanaflex) and methocarbamol (Robaxin)  Continue to eat a healthy diet.   [facility] your [facility].   It was a pleasure to see you today!   Preventive Care 65 Years and Older, Female Preventive care refers to lifestyle choices and visits with your health care provider that can promote health and wellness. This includes:  A yearly physical exam. This is also called an annual well check.  Regular dental and eye exams.  Immunizations.  Screening for certain conditions.  Healthy lifestyle choices, such as diet and exercise. What can I expect for my preventive care visit? Physical exam Your health care provider will check:  Height and weight. These may be used to calculate body mass index (BMI), which is a measurement that tells if you are at a healthy weight.  Heart rate and blood pressure.  Your skin for abnormal spots. Counseling Your health care provider may ask you questions about:  Alcohol, tobacco, and drug use.  Emotional well-being.  Home and relationship well-being.  Sexual activity.  Eating habits.  History of falls.  Memory and ability to understand (cognition).  Work and work Statistician.  Pregnancy and menstrual history. What immunizations do I need?  Influenza (flu) vaccine  This is recommended every year. Tetanus, diphtheria, and pertussis (Tdap) vaccine  You may need a Td booster every 10 years. Varicella (chickenpox) vaccine  You may need this vaccine if you have not already been vaccinated. Zoster (shingles) vaccine  You may need this after age 76. Pneumococcal conjugate (PCV13) vaccine  One dose is recommended after age 76. Pneumococcal polysaccharide (PPSV23)  vaccine  One dose is recommended after age 76. Measles, mumps, and rubella (MMR) vaccine  You may need at least one dose of MMR if you were born in 1957 or later. You may also need a second dose. Meningococcal conjugate (MenACWY) vaccine  You may need this if you have certain conditions. Hepatitis A vaccine  You may need this if you have certain conditions or if you travel or work in places where you may be exposed to hepatitis A. Hepatitis B vaccine  You may need this if you have certain conditions or if you travel or work in places where you may be exposed to hepatitis B. Haemophilus influenzae type b (Hib) vaccine  You may need this if you have certain conditions. You may receive vaccines as individual doses or as more than one vaccine together in one shot (combination vaccines). Talk with your health care provider about the risks and benefits of combination vaccines. What tests do I need? Blood tests  Lipid and cholesterol levels. These may be checked every 5 years, or more frequently depending on your overall health.  Hepatitis C test.  Hepatitis B test. Screening  Lung cancer screening. You may have this screening every year starting at age 76 if you have a 30-pack-year history of smoking and currently smoke or have quit within the past 15 years.  Colorectal cancer screening. All adults should have this screening starting at age 76 and continuing until age 76. Your health care provider may recommend screening at age 48 if you are at increased risk. You will have tests every 1-10 years, depending on your results and the type of screening test.  Diabetes screening. This is done by checking your blood sugar (glucose) after you have not eaten for a while (fasting). You may have this done every 1-3 years.  Mammogram. This may be done every 1-2 years. Talk with your health care provider about how often you should have regular mammograms.  BRCA-related cancer screening. This may  be done if you have a family history of breast, ovarian, tubal, or peritoneal cancers. Other tests  Sexually transmitted disease (STD) testing.  Bone density scan. This is done to screen for osteoporosis. You may have this done starting at age 76. Follow these instructions at home: Eating and drinking  Eat a diet that includes fresh fruits and vegetables, whole grains, lean protein, and low-fat dairy products. Limit your intake of foods with high amounts of sugar, saturated fats, and salt.  Take vitamin and mineral supplements as recommended by your health care provider.  Do not drink alcohol if your health care provider tells you not to drink.  If you drink alcohol: ? Limit how much you have to 0-1 drink a day. ? Be aware of how much alcohol is in your drink. In the [place] one drink equals one 12 oz bottle of beer (355 mL), one 5 oz glass of wine (148 mL), or one 1 oz glass of hard liquor (44 mL). Lifestyle  Take daily care of your teeth and gums.  Stay active. Exercise for at least 30 minutes on 5 or more days each week.  Do not use any products that contain nicotine or tobacco, such as cigarettes, e-cigarettes, and chewing tobacco. If you need help quitting, ask your health care provider.  If you are sexually active, practice safe sex. Use a condom or other form of protection in order to prevent STIs (sexually transmitted infections).  Talk with your health care provider about taking a low-dose aspirin or statin. What's next?  Go to your health care provider once a year for a well check visit.  Ask your health care provider how often you should have your eyes and teeth checked.  Stay up to date on all vaccines. This information is not intended to replace advice given to you by your health care provider. Make sure you discuss any questions you have with your health care provider. Document Revised: 11/08/2018 Document Reviewed: 11/08/2018 Elsevier Patient Education  2020  [employer].

## 2020-10-21 NOTE — Assessment & Plan Note (Signed)
Following with neurosurgery, status post lumbar fusion in July 2021.  Discussed her medication list in great detail, we discussed to avoid taking like medications together such as Lyrica and gabapentin, etodolac and Celebrex, tizanidine and methocarbamol. We wrote this down.  She does not typically take 1 medication for prolonged periods of time, sometimes uses Tylenol.

## 2020-10-21 NOTE — Progress Notes (Signed)
Subjective:    Patient ID: Casey [name], female    DOB: [birth year], 74 y.o.   MRN: [id number]  HPI  This visit occurred during the SARS-CoV-2 public health emergency.  Safety protocols were in place, including screening questions prior to the visit, additional usage of staff PPE, and extensive cleaning of exam room while observing appropriate contact time as indicated for disinfecting solutions.   [name] is a 74 year old female who presents today for complete physical.  She continues to improvement with Shingles rash as they are drying out. She is now struggling with reduction in sensation to left lower extremity and around her waist.   Immunizations: -Tetanus: Completed in 2019 -Influenza: Due -Shingles: Never completed, has active shingles now. -Pneumonia: Completed Prevnar in 2018, Pneumovax 2013 -Covid-19: Completed series   Diet: She endorses a healthy diet.  Exercise: She is not exercising.   Eye exam: No recent exam Dental exam: Completes annually   Mammogram: Completed in 2020 Dexa: Completed in 2020 Colonoscopy: Completed in 2018,  Hep C Screen: Due  BP Readings from Last 3 Encounters:  10/21/20 132/72  10/13/20 120/68  10/09/20 122/66     Review of Systems  Constitutional: Negative for unexpected weight change.  HENT: Negative for rhinorrhea.   Respiratory: Negative for cough and shortness of breath.   Cardiovascular: Negative for chest pain.  Gastrointestinal: Positive for constipation. Negative for diarrhea.       Taking Miralax and Senna, daily bowel movements recently. Sometimes every 2-3 days.  Genitourinary: Negative for difficulty urinating.  Musculoskeletal: Positive for arthralgias and back pain.  Skin: Positive for rash.  Allergic/Immunologic: Negative for environmental allergies.  Neurological: Negative for dizziness, numbness and headaches.  Psychiatric/Behavioral: The patient is not nervous/anxious.        Past Medical History:    Diagnosis Date   Allergy    Chronic back pain    History of kidney stones    Osteoporosis    Rheumatoid arthritis (HCC)      Social History   Socioeconomic History   Marital status: Widowed    Spouse name: Not on file   Number of children: Not on file   Years of education: Not on file   Highest education level: Not on file  Occupational History   Not on file  Tobacco Use   Smoking status: Never Smoker   Smokeless tobacco: Never Used  Vaping Use   Vaping Use: Never used  Substance and Sexual Activity   Alcohol use: Never   Drug use: Never   Sexual activity: Not on file  Other Topics Concern   Not on file  Social History Narrative   Not on file   Social Determinants of Health   Financial Resource Strain: Low Risk    Difficulty of Paying Living Expenses: Not hard at all  Food Insecurity: No Food Insecurity   Worried About Running Out of Food in the Last Year: Never true   Ran Out of Food in the Last Year: Never true  Transportation Needs: No Transportation Needs   Lack of Transportation (Medical): No   Lack of Transportation (Non-Medical): No  Physical Activity: Inactive   Days of Exercise per Week: 0 days   Minutes of Exercise per Session: 0 min  Stress: No Stress Concern Present   Feeling of Stress : Not at all  Social Connections:    Frequency of Communication with Friends and Family: Not on file   Frequency of Social Gatherings with Friends  and Family: Not on file   Attends Religious Services: Not on file   Active Member of Clubs or Organizations: Not on file   Attends Banker Meetings: Not on file   Marital Status: Not on file  Intimate Partner Violence: Not At Risk   Fear of Current or Ex-Partner: No   Emotionally Abused: No   Physically Abused: No   Sexually Abused: No    Past Surgical History:  Procedure Laterality Date   ANTERIOR LATERAL LUMBAR FUSION WITH PERCUTANEOUS SCREW 1 LEVEL N/A 06/17/2020    Procedure: L4-5 EXTREME LATERAL INTERBODY FUSION(XLIF)/POSTERIOR SPINAL FUSION (PSF);  Surgeon: Venetia Night, MD;  Location: [facility];  Service: Neurosurgery;  Laterality: N/A;   EPIDURAL BLOCK INJECTION     great toe Left    straightened and pinned   LUMBAR LAMINECTOMY/DECOMPRESSION MICRODISCECTOMY Right 06/17/2020   Procedure: RIGHT L2-3 MICRODISCECTOMY;  Surgeon: Venetia Night, MD;  Location: [facility] ORS;  Service: Neurosurgery;  Laterality: Right;   NECK SURGERY     ROTATOR CUFF REPAIR Right     Family History  Problem Relation Age of Onset   Heart attack Mother    Mental illness Mother    Bone cancer Brother    Heart attack Brother     Allergies  Allergen Reactions   Leflunomide Other (See Comments)    GI upset     Methotrexate Other (See Comments)    GI upset    Current Outpatient Medications on File Prior to Visit  Medication Sig Dispense Refill   acetaminophen (TYLENOL) 500 MG tablet Take 2 tablets (1,000 mg total) by mouth every 6 (six) hours as needed for mild pain. 30 tablet 0   Adalimumab 40 MG/0.8ML PNKT Inject 0.8 mLs into the skin every 14 (fourteen) days.     Ascorbic Acid (VITAMIN C) 1000 MG tablet Take 1,000 mg by mouth daily.     celecoxib (CELEBREX) 100 MG capsule Take 1 capsule by mouth 2 (two) times daily.      cholecalciferol (VITAMIN D) 25 MCG (1000 UNIT) tablet Take 1,000 Units by mouth daily.     ELDERBERRY PO Take 1 tablet by mouth daily.     etodolac (LODINE) 400 MG tablet Take 400 mg by mouth 2 (two) times daily.     furosemide (LASIX) 20 MG tablet Take 1 tablet by mouth once daily for swelling. 4 tablet 0   gabapentin (NEURONTIN) 300 MG capsule Take by mouth.     lidocaine (LIDODERM) 5 % Place 1 patch onto the skin daily as needed (pain).      Melatonin 10 MG TABS Take 10 mg by mouth at bedtime as needed (sleep.).     methocarbamol (ROBAXIN) 500 MG tablet TAKE 1.5 TABLETS (750 MG TOTAL) BY MOUTH EVERY 6 (SIX)  HOURS.     Multiple Vitamin (MULTIVITAMIN WITH MINERALS) TABS tablet Take 1 tablet by mouth daily. Centrum Silver     Omega-3 Fatty Acids (FISH OIL) 1200 MG CAPS Take 1,200 mg by mouth daily.     polyethylene glycol (MIRALAX / GLYCOLAX) 17 g packet Take 17 g by mouth daily as needed for mild constipation. 14 each 0   predniSONE (DELTASONE) 5 MG tablet Take 2.5 mg by mouth daily.     pregabalin (LYRICA) 50 MG capsule Take 100 mg by mouth in the morning and at bedtime.      senna (SENOKOT) 8.6 MG TABS tablet Take 1 tablet (8.6 mg total) by mouth 2 (two) times daily. 120 tablet  0   Teriparatide, Recombinant, (FORTEO [place]) Inject 20 mcg into the skin daily.     No current facility-administered medications on file prior to visit.    BP 132/72 (BP Location: Left Arm, Patient Position: Sitting, Cuff Size: Normal)    Pulse 99    Temp 98.4 F (36.9 C)    Ht 5\' 4"  (1.626 m) Comment: last height on record   Wt 155 lb (70.3 kg)    SpO2 98%    BMI 26.61 kg/m    Objective:   Physical Exam HENT:     Right Ear: Tympanic membrane and ear canal normal.     Left Ear: Tympanic membrane and ear canal normal.  Eyes:     Pupils: Pupils are equal, round, and reactive to light.  Cardiovascular:     Rate and Rhythm: Normal rate and regular rhythm.  Pulmonary:     Effort: Pulmonary effort is normal.     Breath sounds: Normal breath sounds.  Abdominal:     General: Bowel sounds are normal.     Palpations: Abdomen is soft.     Tenderness: There is no abdominal tenderness.  Musculoskeletal:     Cervical back: Neck supple.     Comments: Generalized decrease in ROM to lower back. Ambulatory in clinic today without cane.  Skin:    General: Skin is warm and dry.     Findings: Rash present.     Comments: Scabbed, flat herpes zoster lesions to left lower extremity and anterior and posterior trunk. Improved from last week.  Neurological:     Mental Status: She is alert and oriented to person, place, and  time.     Cranial Nerves: No cranial nerve deficit.     Deep Tendon Reflexes:     Reflex Scores:      Patellar reflexes are 2+ on the right side and 2+ on the left side. Psychiatric:        Mood and Affect: Mood normal.            Assessment & Plan:

## 2020-10-21 NOTE — Assessment & Plan Note (Signed)
Following with rheumatology, no longer on Fosamax.  Currently on Forteo.

## 2020-10-21 NOTE — Assessment & Plan Note (Addendum)
Follows with rheumatology, managed on prednisone and Humira.  Discussed her medication list in great detail, we discussed to avoid taking like medications together such as Lyrica and gabapentin, etodolac and Celebrex, tizanidine and methocarbamol. We wrote this down.  She does not typically take 1 medication for prolonged periods of time, sometimes uses Tylenol.

## 2020-10-21 NOTE — Assessment & Plan Note (Signed)
Follows with neurosurgery and rheumatology.  Discussed her medication list in great detail, we discussed to avoid taking like medications together such as Lyrica and gabapentin, etodolac and Celebrex, tizanidine and methocarbamol. We wrote this down.  She does not typically take 1 medication for prolonged periods of time, sometimes uses Tylenol.

## 2020-10-21 NOTE — Assessment & Plan Note (Signed)
Shingrix due, but will defer until next year as she has active shingles now.  She declines influenza vaccine. Other immunizations up-to-date.  Mammogram due, she will call [facility] to schedule. Bone density up-to-date. Colonoscopy up-to-date, does not need further evaluation given age.  Encourage healthy diet and regular exercise. Exam today stable. Labs pending.

## 2020-10-21 NOTE — Assessment & Plan Note (Signed)
Resolved

## 2020-10-21 NOTE — Assessment & Plan Note (Signed)
Improving which is reassuring. She completed courses of both prednisone and valacyclovir.  She has no pain which is remarkable. She will update if anything changes.

## 2020-10-22 LAB — LIPID PANEL
Cholesterol: 163 mg/dL
HDL: 55 mg/dL
LDL Cholesterol (Calc): 89 mg/dL
Non-HDL Cholesterol (Calc): 108 mg/dL
Total CHOL/HDL Ratio: 3 (calc)
Triglycerides: 95 mg/dL

## 2020-10-22 LAB — BASIC METABOLIC PANEL
BUN: 25 mg/dL (ref 7–25)
CO2: 29 mmol/L (ref 20–32)
Calcium: 9.9 mg/dL (ref 8.6–10.4)
Chloride: 102 mmol/L (ref 98–110)
Creat: 0.91 mg/dL (ref 0.60–0.93)
Glucose, Bld: 108 mg/dL — ABNORMAL HIGH (ref 65–99)
Potassium: 4.6 mmol/L (ref 3.5–5.3)
Sodium: 138 mmol/L (ref 135–146)

## 2020-10-27 ENCOUNTER — Ambulatory Visit (INDEPENDENT_AMBULATORY_CARE_PROVIDER_SITE_OTHER): Payer: Medicare HMO | Admitting: Primary Care

## 2020-10-27 ENCOUNTER — Other Ambulatory Visit: Payer: Self-pay

## 2020-10-27 ENCOUNTER — Telehealth: Payer: Self-pay

## 2020-10-27 ENCOUNTER — Encounter: Payer: Self-pay | Admitting: Primary Care

## 2020-10-27 DIAGNOSIS — B029 Zoster without complications: Secondary | ICD-10-CM

## 2020-10-27 NOTE — Telephone Encounter (Signed)
[facility] - Client TELEPHONE ADVICE RECORD [facility] Patient Name: Casey [name] Gender: Female DOB: June 11, 1946 Age: 74 Y 2 M 29 D Return Phone Number: [phone] (Primary) Address: City/State/Zip: [place] [address] Client [facility] - Client Client Site [facility] - Day Physician Vernona Rieger - NP Contact Type Call Who Is Calling Patient / Member / Family / Caregiver Call Type Triage / Clinical Relationship To Patient Self Return Phone Number [phone] (Primary) Chief Complaint Leg Swelling And Edema Reason for Call Symptomatic / Request for Health Information Initial Comment Caller states her leg is swollen and is cramping. Translation No Disp. Time Lamount Cohen Time) Disposition Final User 10/27/2020 10:36:24 AM Attempt made - no message left Micael Hampshire 10/27/2020 10:53:47 AM Attempt made - no message left Micael Hampshire 10/27/2020 11:12:41 AM FINAL ATTEMPT MADE - no message left

## 2020-10-27 NOTE — Patient Instructions (Signed)
Continue gabapentin 300 mg as discussed.  Add in either Celebrex or Etodolac for pain.   Resume your 2.5 mg dose of prednisone.  It was a pleasure to see you today!

## 2020-10-27 NOTE — Telephone Encounter (Signed)
Noted, will evaluate. 

## 2020-10-27 NOTE — Assessment & Plan Note (Signed)
Healing very well.   No significant lower extremity swelling noted. No evidence of cellulitis or new injury. Suspect she is experiencing numbness from herpes zoster.  Continue gabapentin as she is currently taking. Add in either etodolac or celebrex for pain along with gabapentin.   Discussed warning signs of underlying infection/cellulitis. She will update.

## 2020-10-27 NOTE — Progress Notes (Signed)
Subjective:    Patient ID: Casey [name], female    DOB: [birth year], 74 y.o.   MRN: [id number]  HPI  This visit occurred during the SARS-CoV-2 public health emergency.  Safety protocols were in place, including screening questions prior to the visit, additional usage of staff PPE, and extensive cleaning of exam room while observing appropriate contact time as indicated for disinfecting solutions.   Ms. Alen is a 74 year old female with a history of chronic joint pain, chronic back pain, herpes Zoster who presents today with a chief complaint of lower extremity swelling and pain.  She is currently recovering from a significant case of herpes zoster to the left medial lower extremity. She noticed the swelling about 6 days ago to her left medial thigh just proximal to patella. She's also noticed left lower extremity numbness and pain. She took 10 mg of prednisone yesterday and again this morning in response to the swelling. She mainly wanted to ensure that she was not developing an infection to her left lower extremity.  She denies erythema, warmth, fevers, cough, exertional dyspnea. Her herpes lesions have improved and she's had no further lesions. She Is taking gabapentin 300 mg in AM, afternoon, and 600 mg in PM. She is not taking any of her other medications.     Review of Systems  Constitutional: Negative for fever.  Skin: Negative for color change.       Left lower extremity swelling  Neurological: Positive for numbness.       Past Medical History:  Diagnosis Date  . Allergy   . Chronic back pain   . History of kidney stones   . Osteoporosis   . Rheumatoid arthritis (HCC)      Social History   Socioeconomic History  . Marital status: Widowed    Spouse name: Not on file  . Number of children: Not on file  . Years of education: Not on file  . Highest education level: Not on file  Occupational History  . Not on file  Tobacco Use  . Smoking status: Never Smoker  .  Smokeless tobacco: Never Used  Vaping Use  . Vaping Use: Never used  Substance and Sexual Activity  . Alcohol use: Never  . Drug use: Never  . Sexual activity: Not on file  Other Topics Concern  . Not on file  Social History Narrative  . Not on file   Social Determinants of Health   Financial Resource Strain: Low Risk   . Difficulty of Paying Living Expenses: Not hard at all  Food Insecurity: No Food Insecurity  . Worried About Programme researcher, broadcasting/film/video in the Last Year: Never true  . Ran Out of Food in the Last Year: Never true  Transportation Needs: No Transportation Needs  . Lack of Transportation (Medical): No  . Lack of Transportation (Non-Medical): No  Physical Activity: Inactive  . Days of Exercise per Week: 0 days  . Minutes of Exercise per Session: 0 min  Stress: No Stress Concern Present  . Feeling of Stress : Not at all  Social Connections:   . Frequency of Communication with Friends and Family: Not on file  . Frequency of Social Gatherings with Friends and Family: Not on file  . Attends Religious Services: Not on file  . Active Member of Clubs or Organizations: Not on file  . Attends Banker Meetings: Not on file  . Marital Status: Not on file  Intimate Partner Violence: Not At  Risk  . Fear of Current or Ex-Partner: No  . Emotionally Abused: No  . Physically Abused: No  . Sexually Abused: No    Past Surgical History:  Procedure Laterality Date  . ANTERIOR LATERAL LUMBAR FUSION WITH PERCUTANEOUS SCREW 1 LEVEL N/A 06/17/2020   Procedure: L4-5 EXTREME LATERAL INTERBODY FUSION(XLIF)/POSTERIOR SPINAL FUSION (PSF);  Surgeon: Venetia Night, MD;  Location: [facility] ORS;  Service: Neurosurgery;  Laterality: N/A;  . EPIDURAL BLOCK INJECTION    . great toe Left    straightened and pinned  . LUMBAR LAMINECTOMY/DECOMPRESSION MICRODISCECTOMY Right 06/17/2020   Procedure: RIGHT L2-3 MICRODISCECTOMY;  Surgeon: Venetia Night, MD;  Location: [facility] ORS;  Service:  Neurosurgery;  Laterality: Right;  . NECK SURGERY    . ROTATOR CUFF REPAIR Right     Family History  Problem Relation Age of Onset  . Heart attack Mother   . Mental illness Mother   . Bone cancer Brother   . Heart attack Brother     Allergies  Allergen Reactions  . Leflunomide Other (See Comments)    GI upset    . Methotrexate Other (See Comments)    GI upset    Current Outpatient Medications on File Prior to Visit  Medication Sig Dispense Refill  . acetaminophen (TYLENOL) 500 MG tablet Take 2 tablets (1,000 mg total) by mouth every 6 (six) hours as needed for mild pain. 30 tablet 0  . Adalimumab 40 MG/0.8ML PNKT Inject 0.8 mLs into the skin every 14 (fourteen) days.    . Ascorbic Acid (VITAMIN C) 1000 MG tablet Take 1,000 mg by mouth daily.    . celecoxib (CELEBREX) 100 MG capsule Take 1 capsule by mouth 2 (two) times daily.     . cholecalciferol (VITAMIN D) 25 MCG (1000 UNIT) tablet Take 1,000 Units by mouth daily.    Marland Kitchen ELDERBERRY PO Take 1 tablet by mouth daily.    Marland Kitchen etodolac (LODINE) 400 MG tablet Take 400 mg by mouth 2 (two) times daily.    Marland Kitchen gabapentin (NEURONTIN) 300 MG capsule Take by mouth.    . lidocaine (LIDODERM) 5 % Place 1 patch onto the skin daily as needed (pain).     . Melatonin 10 MG TABS Take 10 mg by mouth at bedtime as needed (sleep.).    Marland Kitchen methocarbamol (ROBAXIN) 500 MG tablet TAKE 1.5 TABLETS (750 MG TOTAL) BY MOUTH EVERY 6 (SIX) HOURS.    . Multiple Vitamin (MULTIVITAMIN WITH MINERALS) TABS tablet Take 1 tablet by mouth daily. Centrum Silver    . Omega-3 Fatty Acids (FISH OIL) 1200 MG CAPS Take 1,200 mg by mouth daily.    . polyethylene glycol (MIRALAX / GLYCOLAX) 17 g packet Take 17 g by mouth daily as needed for mild constipation. 14 each 0  . predniSONE (DELTASONE) 5 MG tablet Take 2.5 mg by mouth daily.    . pregabalin (LYRICA) 50 MG capsule Take 100 mg by mouth in the morning and at bedtime.     . senna (SENOKOT) 8.6 MG TABS tablet Take 1 tablet  (8.6 mg total) by mouth 2 (two) times daily. 120 tablet 0  . Teriparatide, Recombinant, (FORTEO [place]) Inject 20 mcg into the skin daily.     No current facility-administered medications on file prior to visit.    BP 132/64   Pulse 97   Temp 97.7 F (36.5 C)   Wt 156 lb (70.8 kg)   SpO2 99%   BMI 26.78 kg/m    Objective:   Physical  Exam Cardiovascular:     Comments: No pitting to lower extremities  Pulmonary:     Effort: Pulmonary effort is normal.  Skin:    General: Skin is warm and dry.     Findings: No erythema.     Comments: Well healing herpes zoster lesions to left lower extremity. No erythema, warmth, or tenderness to site.   Left lower extremity slightly more swollen than left. No worse than her last several visits.   Neurological:     Mental Status: She is alert.            Assessment & Plan:

## 2020-11-23 ENCOUNTER — Telehealth: Payer: Self-pay | Admitting: Primary Care

## 2020-11-23 DIAGNOSIS — Z1231 Encounter for screening mammogram for malignant neoplasm of breast: Secondary | ICD-10-CM

## 2020-11-23 NOTE — Telephone Encounter (Signed)
Do you need me to do anything?

## 2020-11-23 NOTE — Telephone Encounter (Signed)
Noted. Order faxed over to [facility] in [place]

## 2020-11-23 NOTE — Telephone Encounter (Signed)
Pt called in wanted to know about getting her mammogram done in [place] instead of [place]

## 2020-11-23 NOTE — Addendum Note (Signed)
Addended by: Lorre Munroe on: 11/23/2020 01:26 PM   Modules accepted: Orders

## 2020-11-23 NOTE — Telephone Encounter (Signed)
No. Just waiting for the order from the provider. It was routed to you in the original message and to me. Thank you for checking

## 2020-11-23 NOTE — Telephone Encounter (Signed)
Spoke with patient. She has been going to [facility] in [place]. Advised patient I will have provider put order in for screening mammogram and I will fax it over to [facility] in [place] and then they will call her to schedule. Since this is the same [facility] system patient should not have to get any records. Patient verbalized understanding. Please place order and let me know when this is done. Thank you

## 2020-11-23 NOTE — Telephone Encounter (Signed)
ordered

## 2020-12-27 IMAGING — RF DG C-ARM 1-60 MIN
1 series · 15 of 24 positions shown · non-contrast
Comparison: Lumbar spine MRI dated 05/08/2020.

CLINICAL DATA: 73-year-old female with lumbar fusion.

EXAM:
LUMBAR SPINE - 2-3 VIEW; DG C-ARM 1-60 MIN; DG C-ARM 1-60 MIN-NO
REPORT

[Series 1: dg x-ray · 0.20mm/px · 15 of 45 slices shown]
[im 1/45]
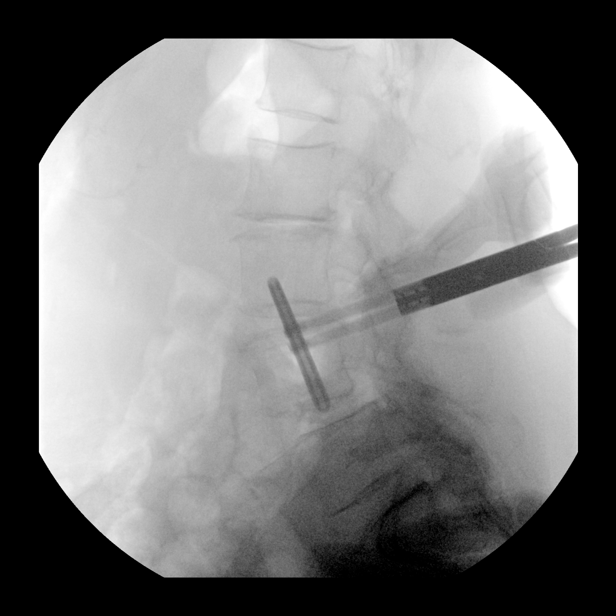
[im 4/45]
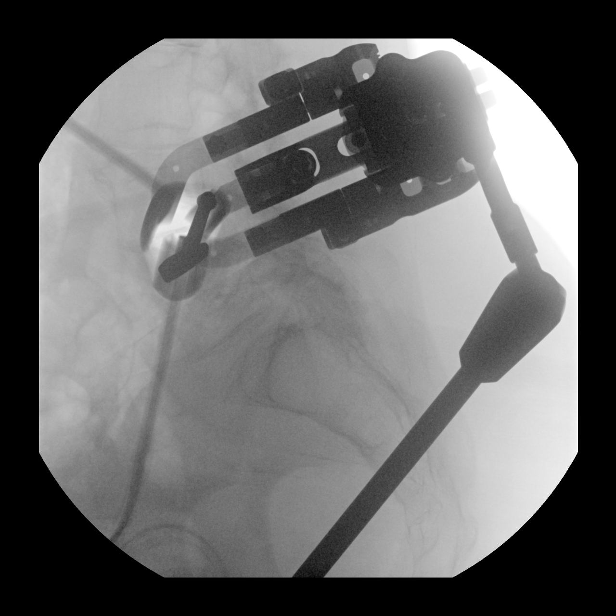
[im 8/45]
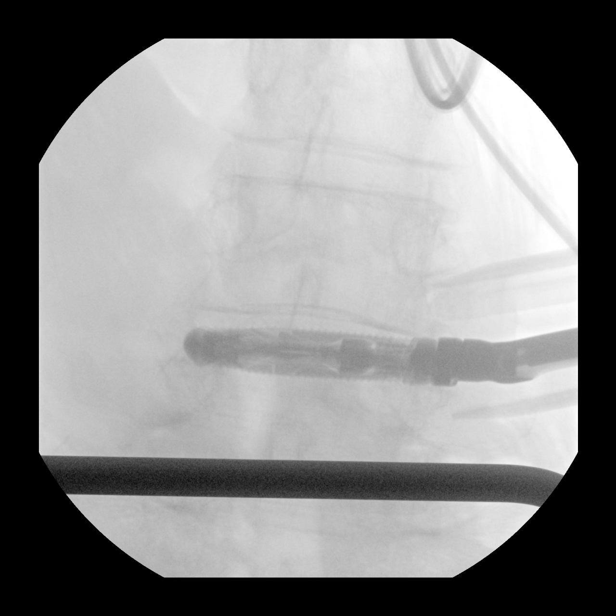
[im 10/45]
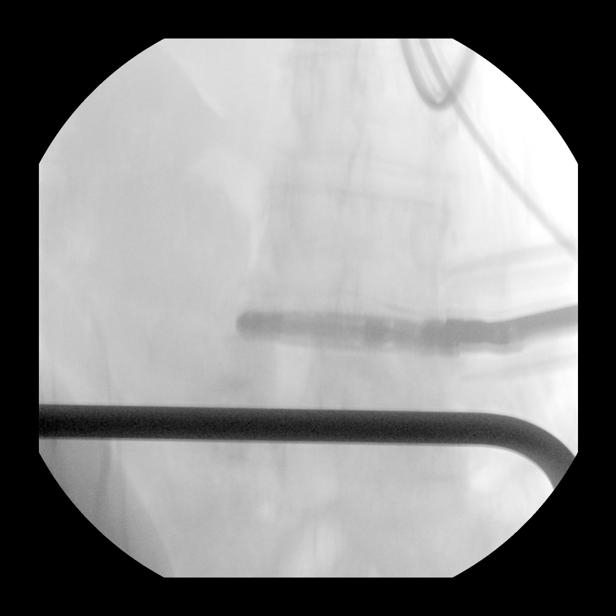
[im 14/45]
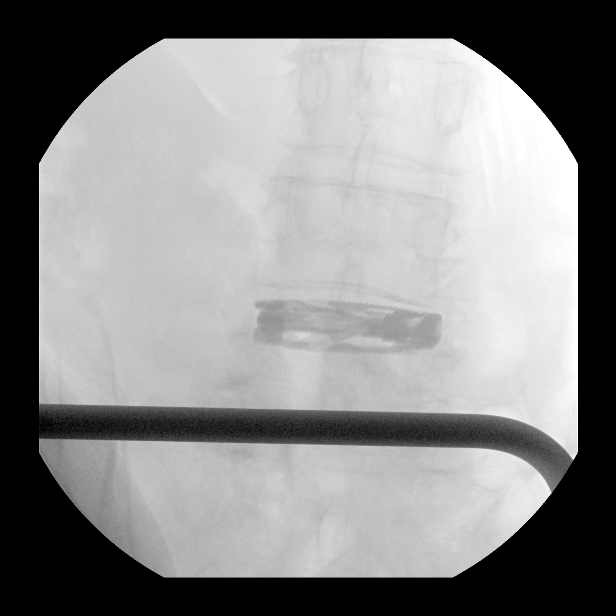
[im 16/45]
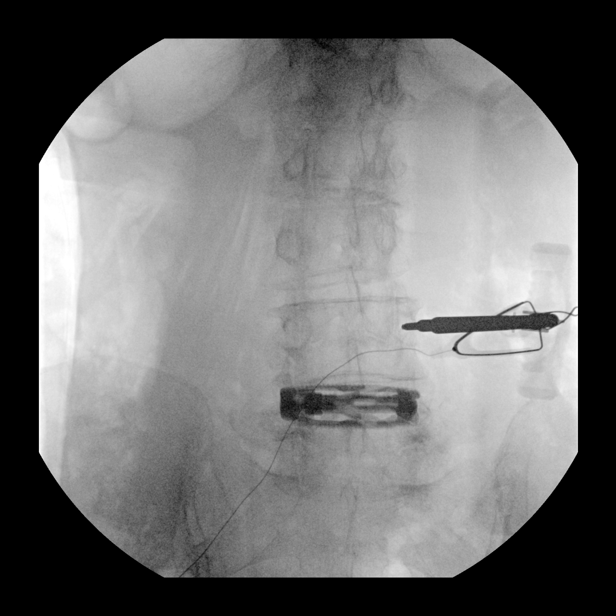
[im 20/45]
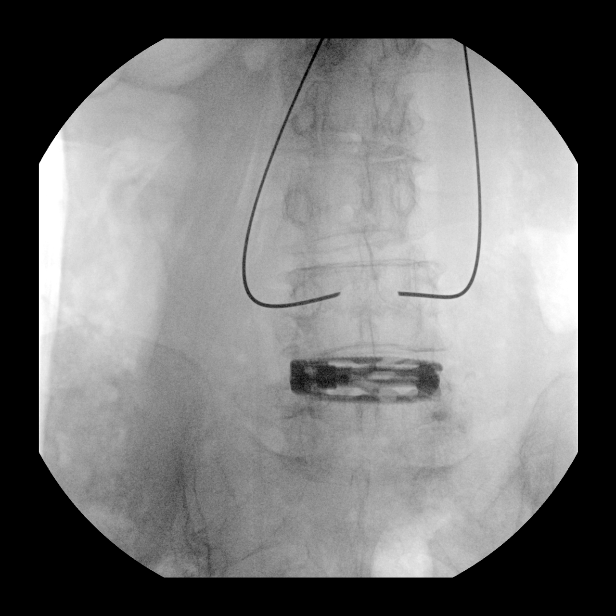
[im 23/45]
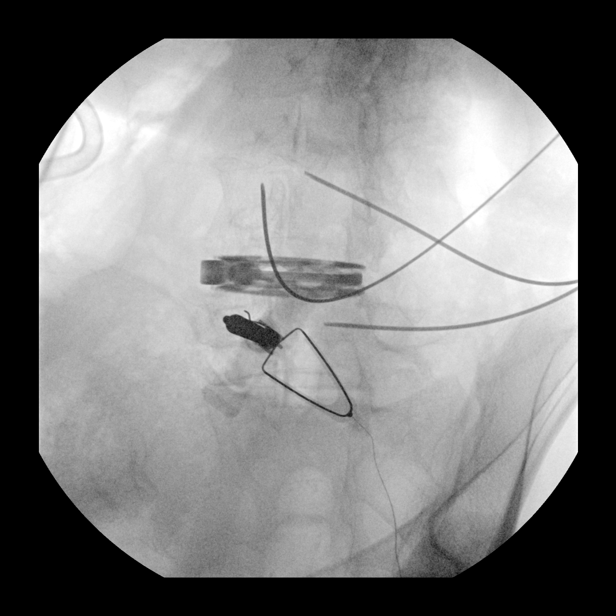
[im 25/45]
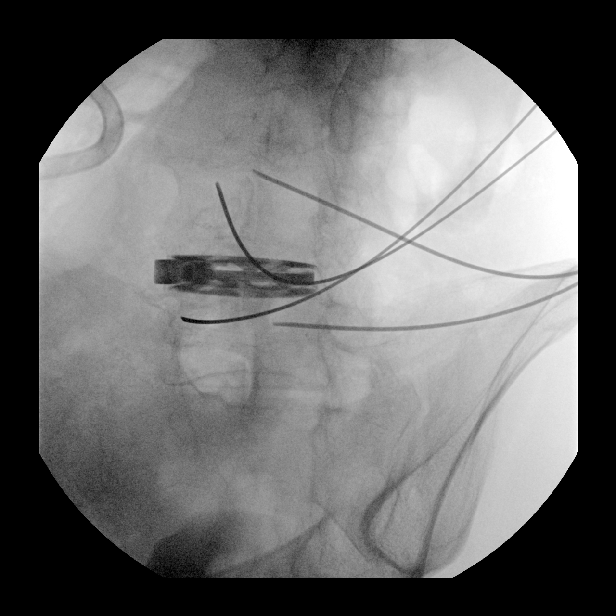
[im 29/45]
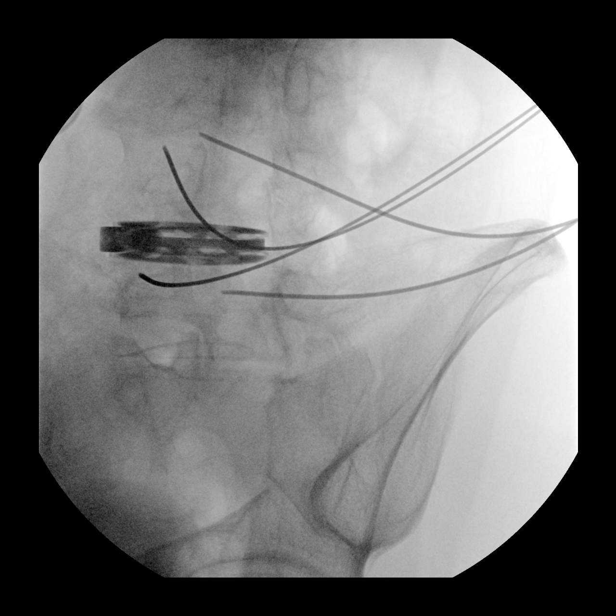
[im 31/45]
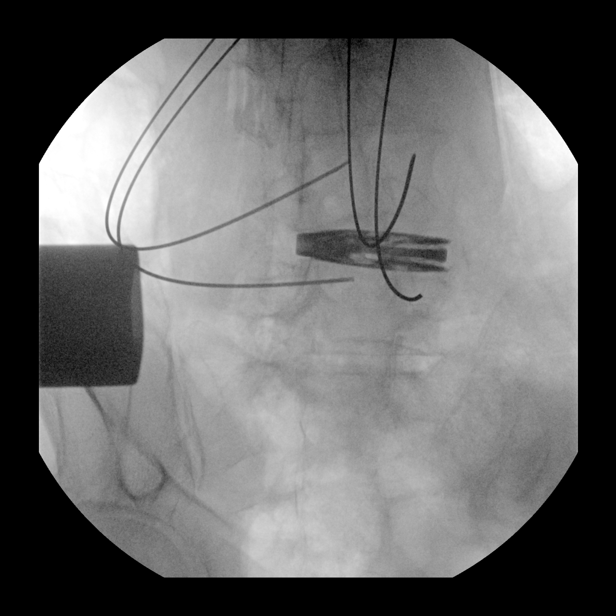
[im 35/45]
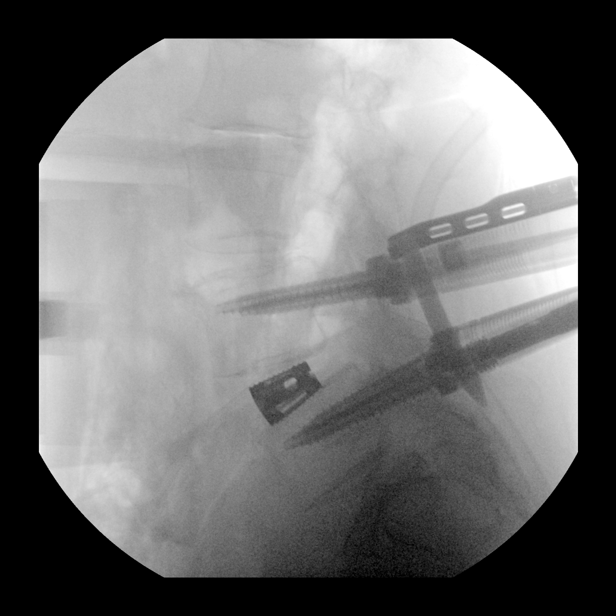
[im 39/45]
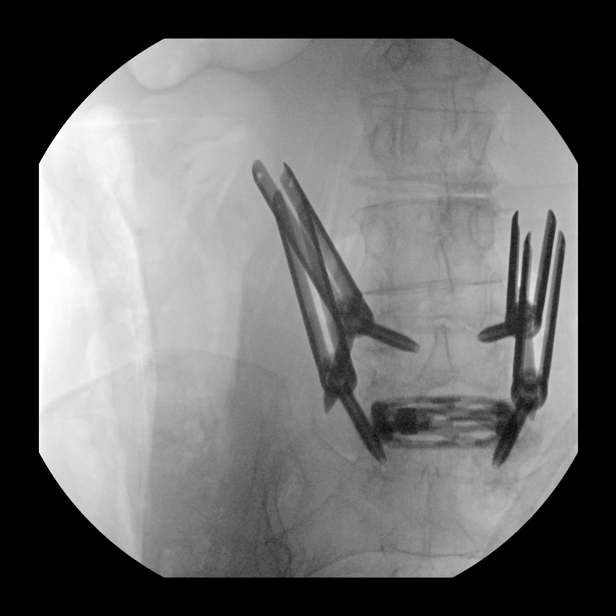
[im 41/45]
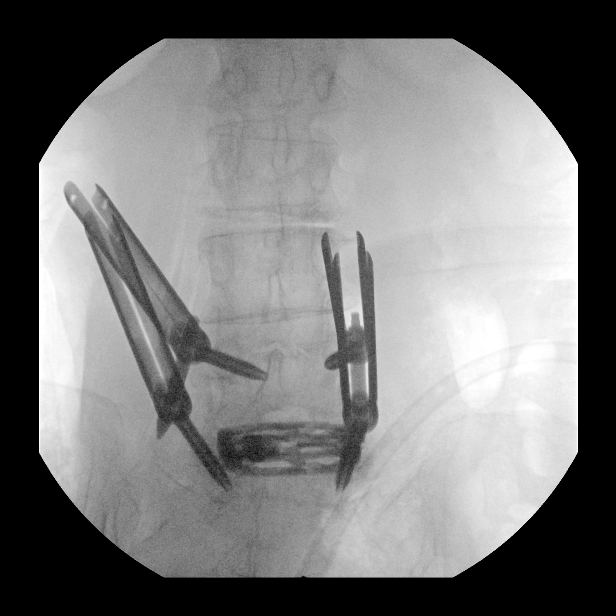
[im 45/45]
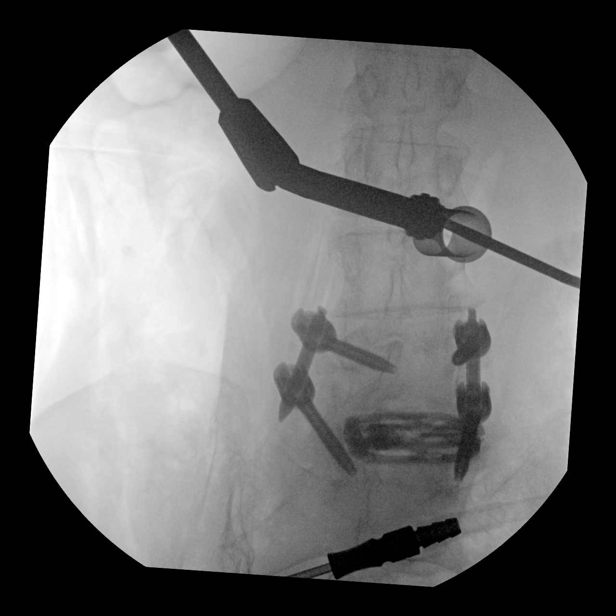

[15 of 24 positions shown; findings below may reference images not displayed]

FINDINGS: Forty-five intraoperative for Skopic images provided. There has been
placement of L4-L5 disc spacer and posterior fusion. The total
fluoroscopic time is 2 minutes, 48 seconds.
IMPRESSION: Intraoperative fluoroscopic images of the lumbar spine.

## 2020-12-27 IMAGING — RF DG C-ARM 1-60 MIN
1 series · 15 of 24 positions shown · non-contrast
Comparison: Lumbar spine MRI dated 05/08/2020.

CLINICAL DATA: 73-year-old female with lumbar fusion.

EXAM:
LUMBAR SPINE - 2-3 VIEW; DG C-ARM 1-60 MIN; DG C-ARM 1-60 MIN-NO
REPORT

[Series 1: dg x-ray · 0.20mm/px · 15 of 45 slices shown]
[im 1/45]
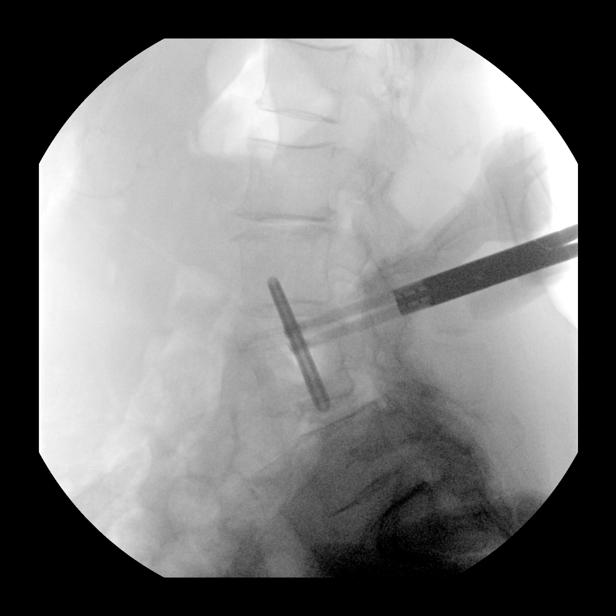
[im 4/45]
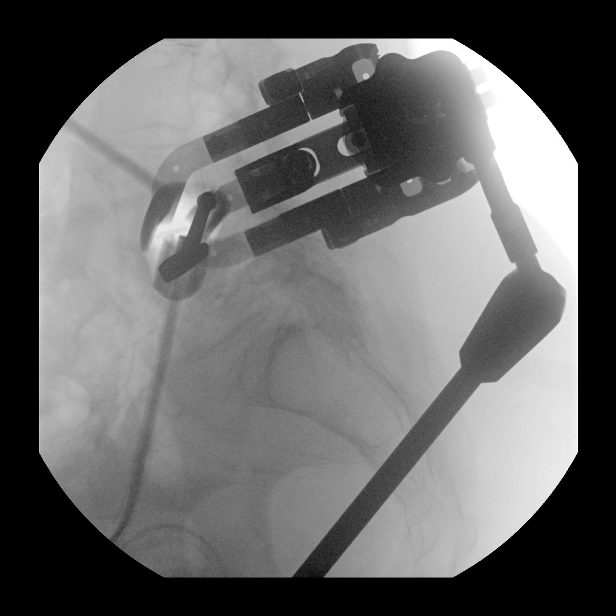
[im 8/45]
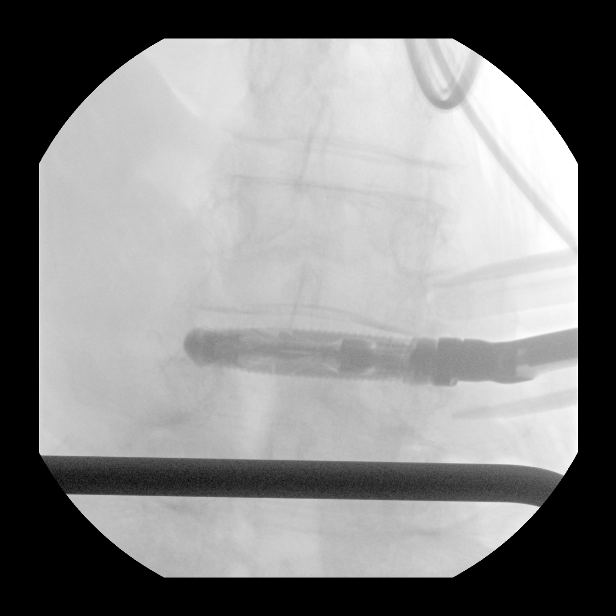
[im 10/45]
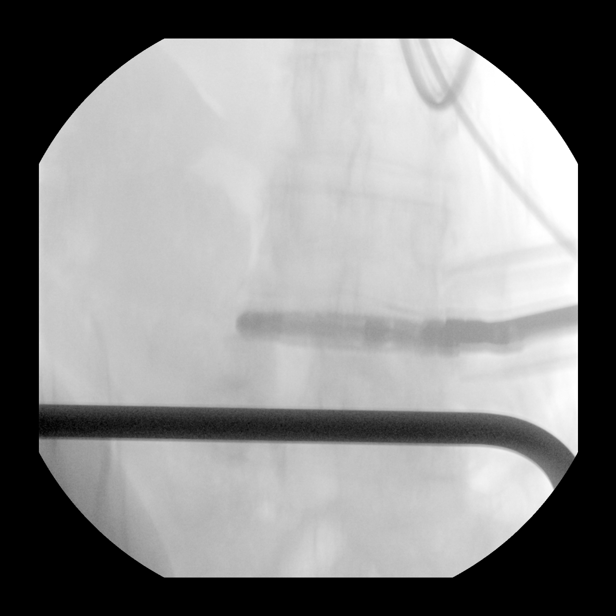
[im 14/45]
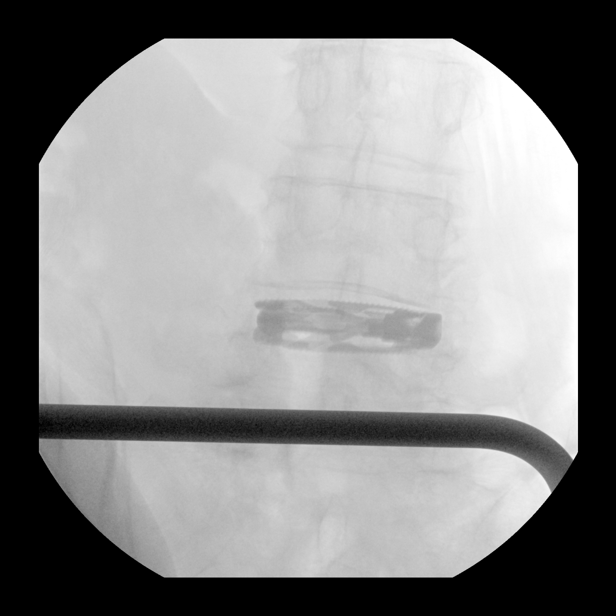
[im 16/45]
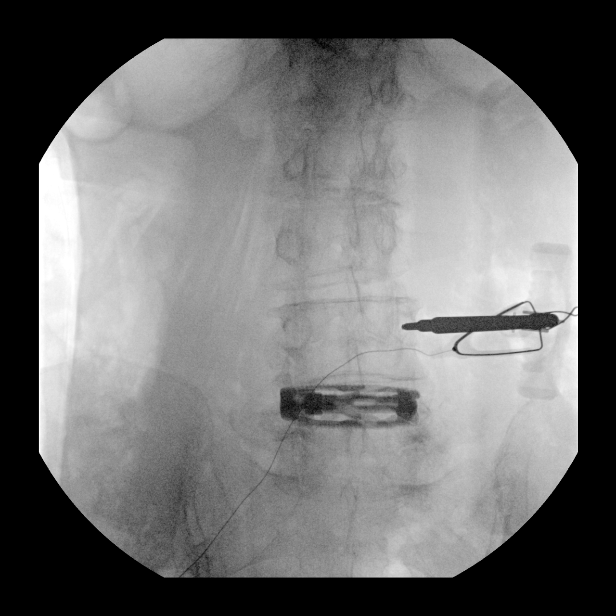
[im 20/45]
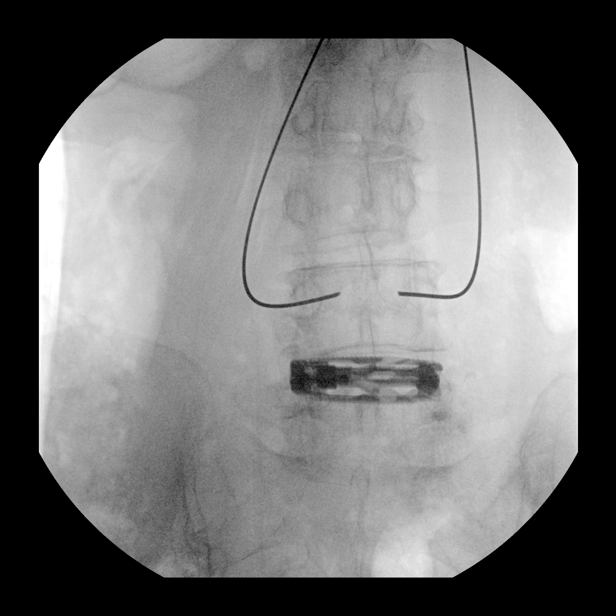
[im 23/45]
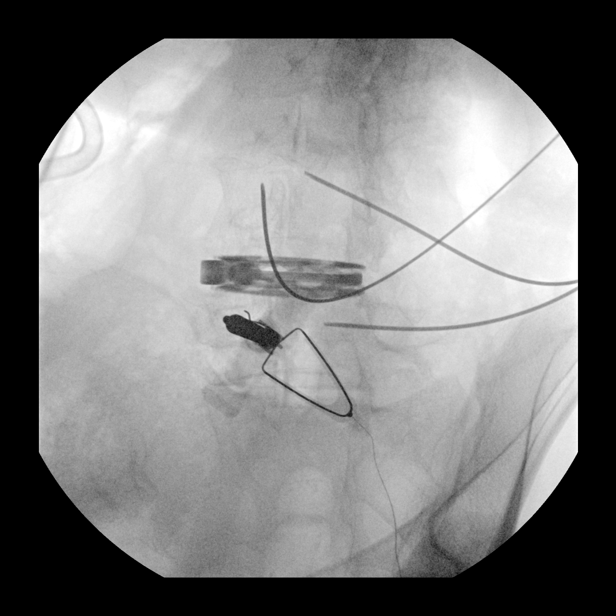
[im 25/45]
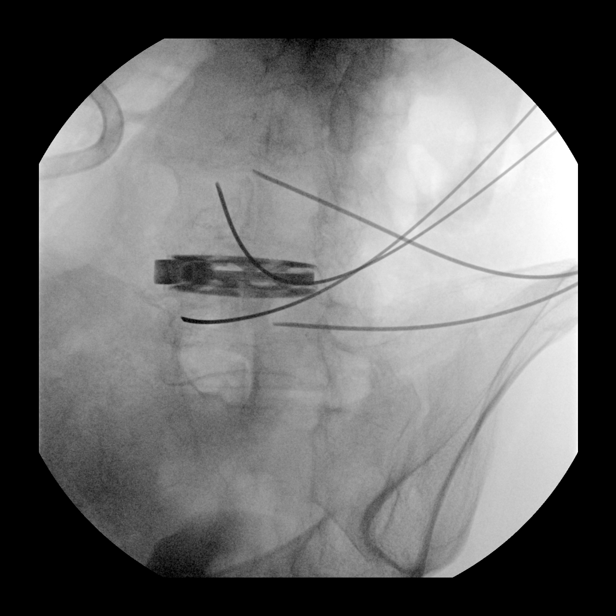
[im 29/45]
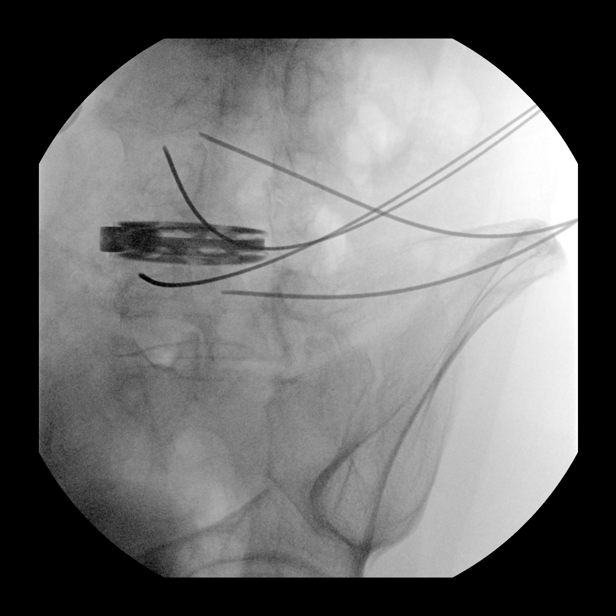
[im 31/45]
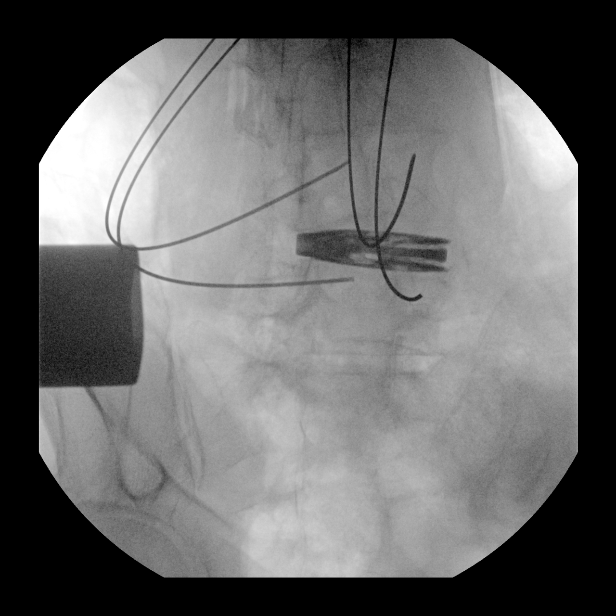
[im 35/45]
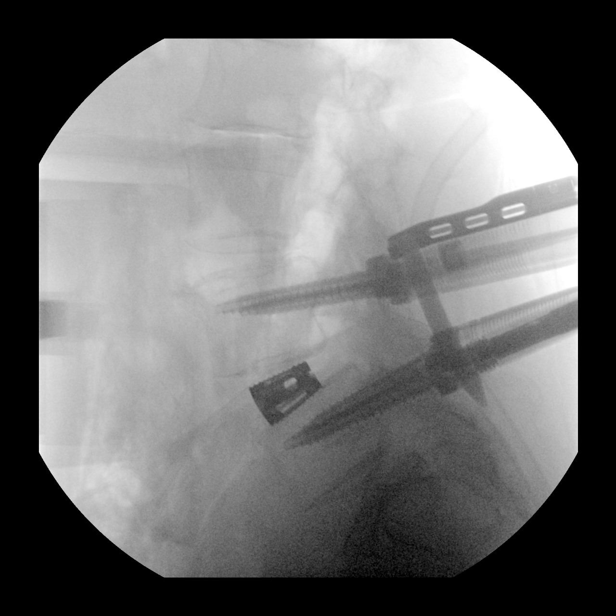
[im 39/45]
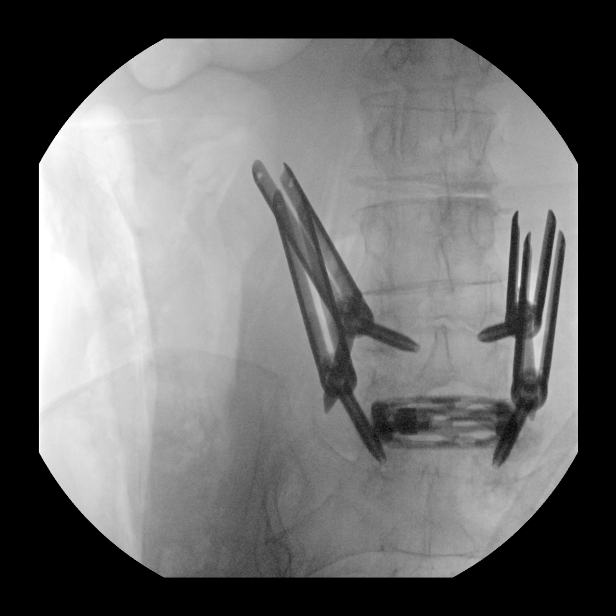
[im 41/45]
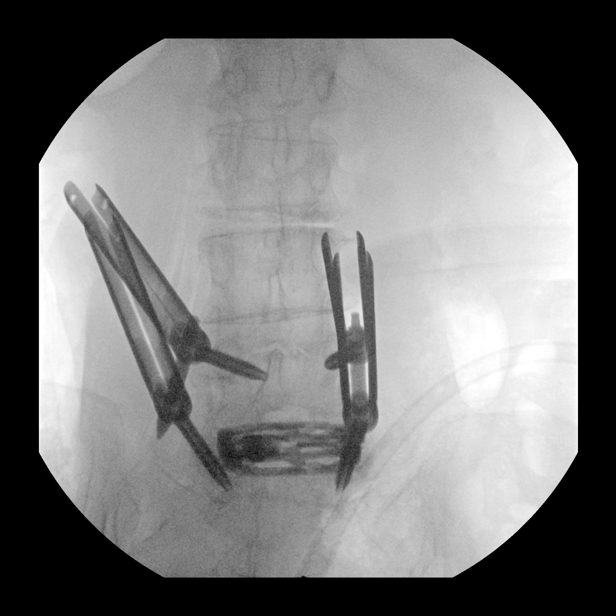
[im 45/45]
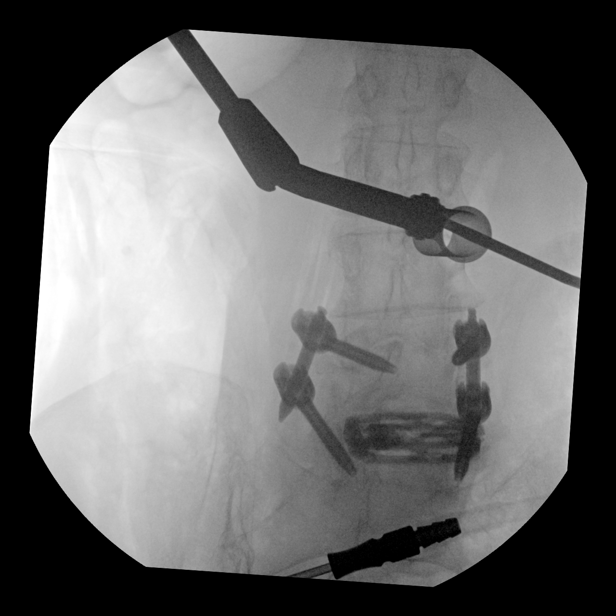

[15 of 24 positions shown; findings below may reference images not displayed]

FINDINGS: Forty-five intraoperative for Skopic images provided. There has been
placement of L4-L5 disc spacer and posterior fusion. The total
fluoroscopic time is 2 minutes, 48 seconds.
IMPRESSION: Intraoperative fluoroscopic images of the lumbar spine.

## 2021-01-10 DIAGNOSIS — M810 Age-related osteoporosis without current pathological fracture: Secondary | ICD-10-CM | POA: Diagnosis not present

## 2021-01-10 DIAGNOSIS — Z79899 Other long term (current) drug therapy: Secondary | ICD-10-CM | POA: Diagnosis not present

## 2021-01-10 DIAGNOSIS — M0550 Rheumatoid polyneuropathy with rheumatoid arthritis of unspecified site: Secondary | ICD-10-CM | POA: Diagnosis not present

## 2021-01-10 DIAGNOSIS — Z7952 Long term (current) use of systemic steroids: Secondary | ICD-10-CM | POA: Diagnosis not present

## 2021-01-10 DIAGNOSIS — G8929 Other chronic pain: Secondary | ICD-10-CM | POA: Diagnosis not present

## 2021-02-16 DIAGNOSIS — M0579 Rheumatoid arthritis with rheumatoid factor of multiple sites without organ or systems involvement: Secondary | ICD-10-CM | POA: Diagnosis not present

## 2021-02-16 DIAGNOSIS — M810 Age-related osteoporosis without current pathological fracture: Secondary | ICD-10-CM | POA: Diagnosis not present

## 2021-02-16 DIAGNOSIS — Z79899 Other long term (current) drug therapy: Secondary | ICD-10-CM | POA: Diagnosis not present

## 2021-02-16 DIAGNOSIS — G629 Polyneuropathy, unspecified: Secondary | ICD-10-CM | POA: Diagnosis not present

## 2021-03-25 DIAGNOSIS — H25813 Combined forms of age-related cataract, bilateral: Secondary | ICD-10-CM | POA: Diagnosis not present

## 2021-08-13 ENCOUNTER — Telehealth: Payer: Self-pay | Admitting: Primary Care

## 2021-08-13 NOTE — Telephone Encounter (Signed)
Pt called wanting to know if something can be called in for constipation

## 2021-08-14 NOTE — Telephone Encounter (Signed)
Tried to call pt. No answer and No VM. 

## 2021-08-17 NOTE — Telephone Encounter (Signed)
Spoke with patient. Patient states she has always had issues with bowels. She usually takes Miralax and stool softners daily. The last 3 weeks noticed she was more constipated which happens at times and when this does happen she takes Citrate OTC but this has been discontinued and patient would like to know what else she can try? She had 2 bowel movements last week but none this week so far. Patient could not tell me what her normal bowel movement amount was since she had chronic issue with this. Patient is having gas normal, she does feel bloated due to this. No nausea or vomiting.

## 2021-08-17 NOTE — Telephone Encounter (Signed)
Mail box is full not able to leave message.   ?

## 2021-08-17 NOTE — Telephone Encounter (Signed)
Have her increase Miralax to BID, continue stool softeners, and schedule a visit with me if no improvement.  It looks like she's due for CPE in November, this needs to be scheduled.

## 2021-08-23 NOTE — Telephone Encounter (Signed)
Called patient will make changes and let us know if no improvement. I have made cpe with fasting labs appointment. Patient was informed at [place] location. Will call if any questions.

## 2021-08-24 DIAGNOSIS — Z0100 Encounter for examination of eyes and vision without abnormal findings: Secondary | ICD-10-CM | POA: Diagnosis not present

## 2021-09-13 DIAGNOSIS — M810 Age-related osteoporosis without current pathological fracture: Secondary | ICD-10-CM | POA: Diagnosis not present

## 2021-09-13 DIAGNOSIS — Z79899 Other long term (current) drug therapy: Secondary | ICD-10-CM | POA: Diagnosis not present

## 2021-09-13 DIAGNOSIS — G629 Polyneuropathy, unspecified: Secondary | ICD-10-CM | POA: Diagnosis not present

## 2021-09-13 DIAGNOSIS — M0579 Rheumatoid arthritis with rheumatoid factor of multiple sites without organ or systems involvement: Secondary | ICD-10-CM | POA: Diagnosis not present

## 2021-10-26 ENCOUNTER — Encounter: Payer: Medicare HMO | Admitting: Primary Care

## 2021-11-04 ENCOUNTER — Ambulatory Visit: Payer: Self-pay | Admitting: Primary Care

## 2021-12-01 ENCOUNTER — Encounter: Payer: Medicare HMO | Admitting: Primary Care

## 2021-12-14 ENCOUNTER — Ambulatory Visit (INDEPENDENT_AMBULATORY_CARE_PROVIDER_SITE_OTHER): Payer: Medicare HMO | Admitting: Primary Care

## 2021-12-14 ENCOUNTER — Encounter: Payer: Self-pay | Admitting: Primary Care

## 2021-12-14 ENCOUNTER — Other Ambulatory Visit: Payer: Self-pay

## 2021-12-14 VITALS — BP 128/62 | HR 82 | Temp 97.8°F | Ht 64.0 in | Wt 149.0 lb

## 2021-12-14 DIAGNOSIS — G894 Chronic pain syndrome: Secondary | ICD-10-CM

## 2021-12-14 DIAGNOSIS — K5909 Other constipation: Secondary | ICD-10-CM

## 2021-12-14 DIAGNOSIS — H6120 Impacted cerumen, unspecified ear: Secondary | ICD-10-CM | POA: Insufficient documentation

## 2021-12-14 DIAGNOSIS — Z0001 Encounter for general adult medical examination with abnormal findings: Secondary | ICD-10-CM | POA: Diagnosis not present

## 2021-12-14 DIAGNOSIS — E2839 Other primary ovarian failure: Secondary | ICD-10-CM

## 2021-12-14 DIAGNOSIS — M069 Rheumatoid arthritis, unspecified: Secondary | ICD-10-CM

## 2021-12-14 DIAGNOSIS — M810 Age-related osteoporosis without current pathological fracture: Secondary | ICD-10-CM

## 2021-12-14 DIAGNOSIS — H6123 Impacted cerumen, bilateral: Secondary | ICD-10-CM | POA: Diagnosis not present

## 2021-12-14 DIAGNOSIS — Z13220 Encounter for screening for lipoid disorders: Secondary | ICD-10-CM | POA: Diagnosis not present

## 2021-12-14 DIAGNOSIS — M47816 Spondylosis without myelopathy or radiculopathy, lumbar region: Secondary | ICD-10-CM

## 2021-12-14 LAB — LIPID PANEL
Cholesterol: 195 mg/dL (ref 0–200)
HDL: 66.6 mg/dL (ref >39.00)
LDL Cholesterol: 111 mg/dL — ABNORMAL HIGH (ref 0–99)
NonHDL: 128.59
Total CHOL/HDL Ratio: 3
Triglycerides: 90 mg/dL (ref 0.0–149.0)
VLDL: 18 mg/dL (ref 0.0–40.0)

## 2021-12-14 LAB — COMPREHENSIVE METABOLIC PANEL
ALT: 8 U/L (ref 0–35)
AST: 20 U/L (ref 0–37)
Albumin: 4.2 g/dL (ref 3.5–5.2)
Alkaline Phosphatase: 89 U/L (ref 39–117)
BUN: 10 mg/dL (ref 6–23)
CO2: 26 mEq/L (ref 19–32)
Calcium: 9.4 mg/dL (ref 8.4–10.5)
Chloride: 105 mEq/L (ref 96–112)
Creatinine, Ser: 0.79 mg/dL (ref 0.40–1.20)
GFR: 72.17 mL/min (ref >60.00)
Glucose, Bld: 90 mg/dL (ref 70–99)
Potassium: 4.5 mEq/L (ref 3.5–5.1)
Sodium: 140 mEq/L (ref 135–145)
Total Bilirubin: 0.5 mg/dL (ref 0.2–1.2)
Total Protein: 7.4 g/dL (ref 6.0–8.3)

## 2021-12-14 NOTE — Assessment & Plan Note (Signed)
No improvement after lumbar fusion in 2021.  Offered PT, referral elsewhere. She declines.  Continue gabapentin 300 mg 2-3 times daily, etodolac 400 mg PRN, methocarbamol 500 mg PRN.

## 2021-12-14 NOTE — Assessment & Plan Note (Signed)
Ongoing. Discussed use of Miralax and Stool softeners.   Also discussed fiber, water, walking.

## 2021-12-14 NOTE — Assessment & Plan Note (Signed)
Denies influenza vaccine. She will get Shingrix at the pharmacy.  Bone density scan due. Ordered. Mammogram due, she will schedule.  Colonoscopy does not need repeating per patient.  Discussed the importance of a healthy diet and regular exercise in order for weight loss, and to reduce the risk of further co-morbidity.  Exam today as noted. Labs pending.

## 2021-12-14 NOTE — Assessment & Plan Note (Signed)
Follows with rheumatology through [facility]. Is working to get on Prolia. No longer on Forteo.  Will update bone density scan.  Order placed.

## 2021-12-14 NOTE — Assessment & Plan Note (Signed)
Following with rheumatology, last visit was in October 2022, notes reviewed from [facility].   Rheumatology working to get patient on Prolia. No longer on Forteo.

## 2021-12-14 NOTE — Assessment & Plan Note (Signed)
Moderate to bilateral canals.  Patient consented to irrigation. Bilateral canals irrigated.  TM and canals post irrigation without infection or bleeding. Patient tolerated well.

## 2021-12-14 NOTE — Patient Instructions (Addendum)
Stop by the lab prior to leaving today. I will notify you of your results once received.   You can use Debrox drops for any sensation of ear wax build up in the future. You can get these at any drug store.  For ear popping: Try using Flonase (fluticasone) nasal spray. Instill 1 spray in each nostril twice daily.   Ask the breast center in [facility] if they can complete your bone density scan along with your mammogram.   It was a pleasure to see you today!  Preventive Care 74 Years and Older, Female Preventive care refers to lifestyle choices and visits with your health care provider that can promote health and wellness. Preventive care visits are also called wellness exams. What can I expect for my preventive care visit? Counseling Your health care provider may ask you questions about your: Medical history, including: Past medical problems. Family medical history. Pregnancy and menstrual history. History of falls. Current health, including: Memory and ability to understand (cognition). Emotional well-being. Home life and relationship well-being. Sexual activity and sexual health. Lifestyle, including: Alcohol, nicotine or tobacco, and drug use. Access to firearms. Diet, exercise, and sleep habits. Work and work Statistician. Sunscreen use. Safety issues such as seatbelt and bike helmet use. Physical exam Your health care provider will check your: Height and weight. These may be used to calculate your BMI (body mass index). BMI is a measurement that tells if you are at a healthy weight. Waist circumference. This measures the distance around your waistline. This measurement also tells if you are at a healthy weight and may help predict your risk of certain diseases, such as type 2 diabetes and high blood pressure. Heart rate and blood pressure. Body temperature. Skin for abnormal spots. What immunizations do I need? Vaccines are usually given at various ages, according to a  schedule. Your health care provider will recommend vaccines for you based on your age, medical history, and lifestyle or other factors, such as travel or where you work. What tests do I need? Screening Your health care provider may recommend screening tests for certain conditions. This may include: Lipid and cholesterol levels. Hepatitis C test. Hepatitis B test. HIV (human immunodeficiency virus) test. STI (sexually transmitted infection) testing, if you are at risk. Lung cancer screening. Colorectal cancer screening. Diabetes screening. This is done by checking your blood sugar (glucose) after you have not eaten for a while (fasting). Mammogram. Talk with your health care provider about how often you should have regular mammograms. BRCA-related cancer screening. This may be done if you have a family history of breast, ovarian, tubal, or peritoneal cancers. Bone density scan. This is done to screen for osteoporosis. Talk with your health care provider about your test results, treatment options, and if necessary, the need for more tests. Follow these instructions at home: Eating and drinking  Eat a diet that includes fresh fruits and vegetables, whole grains, lean protein, and low-fat dairy products. Limit your intake of foods with high amounts of sugar, saturated fats, and salt. Take vitamin and mineral supplements as recommended by your health care provider. Do not drink alcohol if your health care provider tells you not to drink. If you drink alcohol: Limit how much you have to 0-1 drink a day. Know how much alcohol is in your drink. In the [place] one drink equals one 12 oz bottle of beer (355 mL), one 5 oz glass of wine (148 mL), or one 1 oz glass of hard liquor (44 mL).  Lifestyle Brush your teeth every morning and night with fluoride toothpaste. Floss one time each day. Exercise for at least 30 minutes 5 or more days each week. Do not use any products that contain nicotine or  tobacco. These products include cigarettes, chewing tobacco, and vaping devices, such as e-cigarettes. If you need help quitting, ask your health care provider. Do not use drugs. If you are sexually active, practice safe sex. Use a condom or other form of protection in order to prevent STIs. Take aspirin only as told by your health care provider. Make sure that you understand how much to take and what form to take. Work with your health care provider to find out whether it is safe and beneficial for you to take aspirin daily. Ask your health care provider if you need to take a cholesterol-lowering medicine (statin). Find healthy ways to manage stress, such as: Meditation, yoga, or listening to music. Journaling. Talking to a trusted person. Spending time with friends and family. Minimize exposure to UV radiation to reduce your risk of skin cancer. Safety Always wear your seat belt while driving or riding in a vehicle. Do not drive: If you have been drinking alcohol. Do not ride with someone who has been drinking. When you are tired or distracted. While texting. If you have been using any mind-altering substances or drugs. Wear a helmet and other protective equipment during sports activities. If you have firearms in your house, make sure you follow all gun safety procedures. What's next? Visit your health care provider once a year for an annual wellness visit. Ask your health care provider how often you should have your eyes and teeth checked. Stay up to date on all vaccines. This information is not intended to replace advice given to you by your health care provider. Make sure you discuss any questions you have with your health care provider. Document Revised: 05/12/2021 Document Reviewed: 05/12/2021 Elsevier Patient Education  [place].

## 2021-12-14 NOTE — Assessment & Plan Note (Signed)
No improvement after lumbar fusion in 2021. ° °Offered PT, referral elsewhere. She declines. ° °Continue gabapentin 300 mg 2-3 times daily, etodolac 400 mg PRN, methocarbamol 500 mg PRN. °

## 2021-12-14 NOTE — Progress Notes (Signed)
Subjective:    Patient ID: Casey [name], female    DOB: 05/22/1946, 76 y.o.   MRN: [id number]  HPI  Casey [name] is a very pleasant 77 y.o. female who presents today for complete physical and follow up of chronic conditions.  She would also like to discuss chronic left ear, sounds like a "popping sensation" and feels full. She's not tried anything OTC for symptoms. History of cerumen impaction. Denies pain, rhinorrhea, post nasal drip.   Immunizations: -Tetanus: 2019 -Influenza: Due, declines  -Covid-19: Has not completed -Shingles: Has not completed  -Pneumonia: Prevnar 13 in 2018, Pneumovax in   Diet: Fair diet.  Exercise: No regular exercise. Little walking.   Eye exam: Completes annually  Dental exam: Completes semi-annually   Mammogram: Completed January 2022, see Care Everywhere  Dexa: Completed 2020 Colonoscopy: Completed years ago, doesn't need to continue.       Review of Systems  Constitutional:  Negative for unexpected weight change.  HENT:  Negative for rhinorrhea.        Ear fullness   Respiratory:  Negative for cough and shortness of breath.   Cardiovascular:  Negative for chest pain.  Gastrointestinal:  Positive for constipation. Negative for diarrhea.  Genitourinary:  Negative for difficulty urinating.  Musculoskeletal:  Positive for arthralgias and back pain.  Skin:  Negative for rash.  Allergic/Immunologic: Negative for environmental allergies.  Neurological:  Negative for dizziness and headaches.  Psychiatric/Behavioral:  The patient is not nervous/anxious.         Past Medical History:  Diagnosis Date   Allergy    Chronic back pain    Herpes zoster without complication 10/09/2020   History of kidney stones    Osteoporosis    Rheumatoid arthritis (HCC)     Social History   Socioeconomic History   Marital status: Widowed    Spouse name: Not on file   Number of children: Not on file   Years of education: Not on file   Highest  education level: Not on file  Occupational History   Not on file  Tobacco Use   Smoking status: Never   Smokeless tobacco: Never  Vaping Use   Vaping Use: Never used  Substance and Sexual Activity   Alcohol use: Never   Drug use: Never   Sexual activity: Not on file  Other Topics Concern   Not on file  Social History Narrative   Not on file   Social Determinants of Health   Financial Resource Strain: Not on file  Food Insecurity: Not on file  Transportation Needs: Not on file  Physical Activity: Not on file  Stress: Not on file  Social Connections: Not on file  Intimate Partner Violence: Not on file    Past Surgical History:  Procedure Laterality Date   ANTERIOR LATERAL LUMBAR FUSION WITH PERCUTANEOUS SCREW 1 LEVEL N/A 06/17/2020   Procedure: L4-5 EXTREME LATERAL INTERBODY FUSION(XLIF)/POSTERIOR SPINAL FUSION (PSF);  Surgeon: Venetia Night, MD;  Location: [facility];  Service: Neurosurgery;  Laterality: N/A;   EPIDURAL BLOCK INJECTION     great toe Left    straightened and pinned   LUMBAR LAMINECTOMY/DECOMPRESSION MICRODISCECTOMY Right 06/17/2020   Procedure: RIGHT L2-3 MICRODISCECTOMY;  Surgeon: Venetia Night, MD;  Location: [facility];  Service: Neurosurgery;  Laterality: Right;   NECK SURGERY     ROTATOR CUFF REPAIR Right     Family History  Problem Relation Age of Onset   Heart attack Mother    Mental illness Mother  Bone cancer Brother    Heart attack Brother     Allergies  Allergen Reactions   Leflunomide Other (See Comments)    GI upset     Methotrexate Other (See Comments)    GI upset    Current Outpatient Medications on File Prior to Visit  Medication Sig Dispense Refill   acetaminophen (TYLENOL) 500 MG tablet Take 2 tablets (1,000 mg total) by mouth every 6 (six) hours as needed for mild pain. 30 tablet 0   Adalimumab 40 MG/0.8ML PNKT Inject 0.8 mLs into the skin every 14 (fourteen) days.     Ascorbic Acid (VITAMIN C) 1000 MG tablet Take  1,000 mg by mouth daily.     cholecalciferol (VITAMIN D) 25 MCG (1000 UNIT) tablet Take 1,000 Units by mouth daily.     ELDERBERRY PO Take 1 tablet by mouth daily.     etodolac (LODINE) 400 MG tablet Take 400 mg by mouth 2 (two) times daily.     gabapentin (NEURONTIN) 300 MG capsule TAKE 1 CAPSULE BY MOUTH THREE TIMES A DAY     Melatonin 10 MG TABS Take 10 mg by mouth at bedtime as needed (sleep.).     methocarbamol (ROBAXIN) 500 MG tablet TAKE 1.5 TABLETS (750 MG TOTAL) BY MOUTH EVERY 6 (SIX) HOURS.     Multiple Vitamin (MULTIVITAMIN WITH MINERALS) TABS tablet Take 1 tablet by mouth daily. Centrum Silver     polyethylene glycol (MIRALAX / GLYCOLAX) 17 g packet Take 17 g by mouth daily as needed for mild constipation. 14 each 0   predniSONE (DELTASONE) 5 MG tablet Take 2.5 mg by mouth daily.     lidocaine (LIDODERM) 5 % Place 1 patch onto the skin daily as needed (pain).  (Patient not taking: Reported on 12/14/2021)     Omega-3 Fatty Acids (FISH OIL) 1200 MG CAPS Take 1,200 mg by mouth daily. (Patient not taking: Reported on 12/14/2021)     senna (SENOKOT) 8.6 MG TABS tablet Take 1 tablet (8.6 mg total) by mouth 2 (two) times daily. (Patient not taking: Reported on 12/14/2021) 120 tablet 0   No current facility-administered medications on file prior to visit.    BP 128/62    Pulse 82    Temp 97.8 F (36.6 C) (Temporal)    Ht 5\' 4"  (1.626 m)    Wt 149 lb (67.6 kg)    SpO2 97%    BMI 25.58 kg/m  Objective:   Physical Exam HENT:     Right Ear: There is impacted cerumen.     Left Ear: There is impacted cerumen.     Nose: Nose normal.  Eyes:     Conjunctiva/sclera: Conjunctivae normal.     Pupils: Pupils are equal, round, and reactive to light.  Neck:     Thyroid: No thyromegaly.  Cardiovascular:     Rate and Rhythm: Normal rate and regular rhythm.     Heart sounds: No murmur heard. Pulmonary:     Effort: Pulmonary effort is normal.     Breath sounds: Normal breath sounds. No rales.   Abdominal:     General: Bowel sounds are normal.     Palpations: Abdomen is soft.     Tenderness: There is no abdominal tenderness.  Musculoskeletal:        General: Normal range of motion.     Cervical back: Neck supple.  Lymphadenopathy:     Cervical: No cervical adenopathy.  Skin:    General: Skin is warm and dry.  Findings: No rash.  Neurological:     Mental Status: She is alert and oriented to person, place, and time.     Cranial Nerves: No cranial nerve deficit.     Deep Tendon Reflexes: Reflexes are normal and symmetric.          Assessment & Plan:      This visit occurred during the SARS-CoV-2 public health emergency.  Safety protocols were in place, including screening questions prior to the visit, additional usage of staff PPE, and extensive cleaning of exam room while observing appropriate contact time as indicated for disinfecting solutions.

## 2021-12-22 ENCOUNTER — Other Ambulatory Visit: Payer: Self-pay | Admitting: Primary Care

## 2021-12-22 DIAGNOSIS — E785 Hyperlipidemia, unspecified: Secondary | ICD-10-CM

## 2021-12-22 MED ORDER — PRAVASTATIN SODIUM 40 MG PO TABS: 40.0000 mg | ORAL_TABLET | Freq: Every day | ORAL | 3 refills | Status: DC

## 2022-02-14 ENCOUNTER — Other Ambulatory Visit: Payer: Self-pay | Admitting: Primary Care

## 2022-02-14 DIAGNOSIS — E785 Hyperlipidemia, unspecified: Secondary | ICD-10-CM

## 2022-02-18 LAB — HM MAMMOGRAPHY

## 2022-02-21 ENCOUNTER — Other Ambulatory Visit: Payer: Medicare HMO

## 2022-02-23 ENCOUNTER — Encounter: Payer: Self-pay | Admitting: Primary Care

## 2022-03-04 ENCOUNTER — Emergency Department
Admission: EM | Admit: 2022-03-04 | Discharge: 2022-03-04 | Disposition: A | Discharge status: Home / Self Care | Payer: Medicare HMO | Attending: Emergency Medicine | Admitting: Emergency Medicine

## 2022-03-04 ENCOUNTER — Other Ambulatory Visit: Payer: Self-pay

## 2022-03-04 DIAGNOSIS — M545 Low back pain, unspecified: Secondary | ICD-10-CM | POA: Diagnosis present

## 2022-03-04 DIAGNOSIS — G8929 Other chronic pain: Secondary | ICD-10-CM | POA: Insufficient documentation

## 2022-03-04 MED ORDER — IBUPROFEN 400 MG PO TABS
400.0000 mg | ORAL_TABLET | Freq: Once | ORAL | Status: AC
  Administered 2022-03-04: 400 mg via ORAL
  Filled 2022-03-04: qty 1

## 2022-03-04 MED ORDER — LIDOCAINE 5 % EX PTCH: 1.0000 | MEDICATED_PATCH | CUTANEOUS | 0 refills | Status: DC

## 2022-03-04 MED ORDER — LIDOCAINE 5 % EX PTCH
1.0000 | MEDICATED_PATCH | CUTANEOUS | Status: DC
  Administered 2022-03-04: 1 via TRANSDERMAL
  Filled 2022-03-04: qty 1

## 2022-03-04 NOTE — ED Notes (Signed)
See triage note presents with lower back pain  states pain is in lower back which is radiating into both legs  states she feels like she is sitting on a "brick"   ambulates slowly d/t pain ? ?

## 2022-03-04 NOTE — Discharge Instructions (Addendum)
Has follow-up with both your family medicine doctor and Dr. Cari Caraway.  You can try the Lidoderm patches as well as 400 mg of Profen every 6 hours. ?

## 2022-03-04 NOTE — ED Provider Notes (Signed)
? ?[facility] ?Provider Note ? ? ? Event Date/Time  ? First MD Initiated Contact with Patient 03/04/22 1022   ?  (approximate) ? ? ?History  ? ?Back Pain ? ? ?HPI ? ?Casey [name] is a 76 y.o. female past medical history of rheumatoid arthritis, osteoporosis, final stenosis status post lumbar fusion in 2021 who presents with ongoing back pain.  Patient notes that since having her lumbar fusion she has had chronic back pain.  It is in the bilateral lower buttock region rating down to the legs she denies numbness tingling or weakness.  Pain has been rather constant for several years.  She was told by Dr. Marcell BarlowYarborough that the surgery just did not take and there is not much else that she could do.  Did complete physical therapy before but ran out of additional appointments.  Denies any new injuries.  She presents today because she is wanting there is any thing else such as an injection that she could get to help with the pain.  Pain is worse with sitting lying down.  No position seems to make it worse.  Denies bowel bladder incontinence saddle anesthesia fevers chills.  She is on 2.5 mg of prednisone daily. ? ?  ? ?Past Medical History:  ?Diagnosis Date  ? Allergy   ? Chronic back pain   ? Herpes zoster without complication 10/09/2020  ? History of kidney stones   ? Osteoporosis   ? Rheumatoid arthritis (HCC)   ? ? ?Patient Active Problem List  ? Diagnosis Date Noted  ? Chronic constipation 12/14/2021  ? Cerumen impaction 12/14/2021  ? Encounter for annual general medical examination with abnormal findings in adult 10/21/2020  ? Chronic bilateral thoracic back pain 08/28/2020  ? Chronic pain of right hip 08/28/2020  ? Bilateral lower extremity edema 07/01/2020  ? Anterolisthesis 06/17/2020  ? Chronic pain syndrome 04/21/2020  ? Chronic radicular lumbar pain 10/31/2019  ? Lumbar radiculopathy 10/31/2019  ? Lumbar facet arthropathy 10/31/2019  ? Lumbar spondylosis 10/31/2019  ? Spinal stenosis,  lumbar region, with neurogenic claudication 10/31/2019  ? Lumbar degenerative disc disease 10/31/2019  ? Bilateral primary osteoarthritis of knee 10/31/2019  ? Sacroiliac joint pain 10/31/2019  ? Nondisplaced fracture of right radial styloid process, initial encounter for closed fracture 08/06/2018  ? Rheumatoid arthritis, unspecified (HCC) 07/29/2014  ? Osteoporosis 07/29/2014  ? ? ? ?Physical Exam  ?Triage Vital Signs: ?ED Triage Vitals  ?Enc Vitals Group  ?   BP 03/04/22 0930 (!) 163/84  ?   Pulse Rate 03/04/22 0930 (!) 101  ?   Resp 03/04/22 0930 16  ?   Temp 03/04/22 0930 97.8 ?F (36.6 ?C)  ?   Temp Source 03/04/22 0930 Oral  ?   SpO2 03/04/22 0930 100 %  ?   Weight 03/04/22 0935 149 lb 0.5 oz (67.6 kg)  ?   Height 03/04/22 0935 5\' 4"  (1.626 m)  ?   Head Circumference --   ?   Peak Flow --   ?   Pain Score 03/04/22 0925 10  ?   Pain Loc --   ?   Pain Edu? --   ?   Excl. in GC? --   ? ? ?Most recent vital signs: ?Vitals:  ? 03/04/22 0930  ?BP: (!) 163/84  ?Pulse: (!) 101  ?Resp: 16  ?Temp: 97.8 ?F (36.6 ?C)  ?SpO2: 100%  ? ? ? ?General: Awake, no distress.  ?CV:  Good peripheral perfusion. 2+  dp pulses BL ?Resp:  Normal effort.  ?Abd:  No distention.  ?Neuro:             Awake, Alert, Oriented x 3  ?Other:  No midline lumbar ttp ?5/5 strength with hip flexion, knee flexion/extension, plantarflexion and dorsiflexion ?Sensation is grossly intact in bilateral lower extremities ?She is able to ambulate without difficulty ? ? ?ED Results / Procedures / Treatments  ?Labs ?(all labs ordered are listed, but only abnormal results are displayed) ?Labs Reviewed - No data to display ? ? ?EKG ? ? ? ? ?RADIOLOGY ? ? ? ?PROCEDURES: ? ?Critical Care performed: No ? ?Procedures ? ? ? ? ?MEDICATIONS ORDERED IN ED: ?Medications  ?lidocaine (LIDODERM) 5 % 1 patch (has no administration in time range)  ?ibuprofen (ADVIL) tablet 400 mg (has no administration in time range)  ? ? ? ?IMPRESSION / MDM / ASSESSMENT AND PLAN / ED COURSE   ?I reviewed the triage vital signs and the nursing notes. ?             ?               ? ?Differential diagnosis includes, but is not limited to, spinal stenosis, less likely compression fracture, pathologic fracture, cauda equina syndrome, conus medullaris ? ?This patient is a 76 year old female with a history of prior spinal fusion for spinal stenosis with Dr. Marcell Barlow in 2021 presents with chronic back pain.  Her symptoms have been going on for over a year.  Pain is located in bilateral buttocks radiating down to the legs but she denies numbness tingling or weakness.  She has no other red flag symptoms including trauma bowel bladder incontinence fevers.  She is on chronic steroids but this has been for a while and there has been no real acute change in her pain.  On exam she has no midline tenderness no overlying skin changes in the back and she has full strength with hip flexion knee flexion extension and plantarflexion dorsiflexion is able to ambulate.  My suspicion for acute cord compression is quite low given her reassuring exam and the chronicity of her symptoms.  I suspect that her pain is related to the chronic spinal stenosis.  She is already taking muscle relaxant at bedtime and gabapentin and Tylenol.  Suggested following up with primary care for arrangement for physical therapy as well as follow-up with Dr. Marcell Barlow.  Discussed short course of ibuprofen and will trial Lidoderm patch.  No indication for emergent MRI today. ? ?  ? ? ?FINAL CLINICAL IMPRESSION(S) / ED DIAGNOSES  ? ?Final diagnoses:  ?Chronic bilateral low back pain, unspecified whether sciatica present  ? ? ? ?Rx / DC Orders  ? ?ED Discharge Orders   ? ?      Ordered  ?  lidocaine (LIDODERM) 5 %  Every 24 hours       ? 03/04/22 1054  ? ?  ?  ? ?  ? ? ? ?Note:  This document was prepared using Dragon voice recognition software and may include unintentional dictation errors. ?  ?Georga Hacking, MD ?03/04/22 1058 ? ?

## 2022-03-04 NOTE — ED Triage Notes (Signed)
Pt c/o lower back pain that radiates into the buttock and leg, states she is already on medication for it but it is not giving her any relief. ?

## 2022-03-09 ENCOUNTER — Telehealth: Payer: Self-pay | Admitting: Student in an Organized Health Care Education/Training Program

## 2022-03-09 NOTE — Telephone Encounter (Signed)
This patient last saw Dr Cherylann Ratel in 04-2021. She had surgery 05-2021. She is having a lot of pain and wants to come in asap, today if possible to see Dr. Cherylann Ratel. Please call patient to advise ?

## 2022-03-09 NOTE — Telephone Encounter (Signed)
Per schedule is coming on MOnday ?

## 2022-03-09 NOTE — Telephone Encounter (Signed)
Casey [name], looks like it was 2021 since she has been here last.  She will need to make an appointment.  ?

## 2022-03-10 ENCOUNTER — Other Ambulatory Visit (INDEPENDENT_AMBULATORY_CARE_PROVIDER_SITE_OTHER): Payer: Medicare HMO

## 2022-03-10 DIAGNOSIS — E785 Hyperlipidemia, unspecified: Secondary | ICD-10-CM | POA: Diagnosis not present

## 2022-03-10 LAB — LIPID PANEL
Cholesterol: 153 mg/dL (ref 0–200)
HDL: 55.1 mg/dL (ref >39.00)
LDL Cholesterol: 86 mg/dL (ref 0–99)
NonHDL: 98
Total CHOL/HDL Ratio: 3
Triglycerides: 60 mg/dL (ref 0.0–149.0)
VLDL: 12 mg/dL (ref 0.0–40.0)

## 2022-03-10 LAB — HEPATIC FUNCTION PANEL
ALT: 7 U/L (ref 0–35)
AST: 17 U/L (ref 0–37)
Albumin: 3.9 g/dL (ref 3.5–5.2)
Alkaline Phosphatase: 57 U/L (ref 39–117)
Bilirubin, Direct: 0.1 mg/dL (ref 0.0–0.3)
Total Bilirubin: 0.4 mg/dL (ref 0.2–1.2)
Total Protein: 7.3 g/dL (ref 6.0–8.3)

## 2022-03-14 ENCOUNTER — Ambulatory Visit
Payer: Medicare HMO | Attending: Student in an Organized Health Care Education/Training Program | Admitting: Student in an Organized Health Care Education/Training Program

## 2022-03-14 ENCOUNTER — Encounter: Payer: Self-pay | Admitting: Student in an Organized Health Care Education/Training Program

## 2022-03-14 VITALS — BP 139/75 | HR 103 | Temp 97.4°F | Ht 63.0 in | Wt 144.0 lb

## 2022-03-14 DIAGNOSIS — G8929 Other chronic pain: Secondary | ICD-10-CM | POA: Diagnosis present

## 2022-03-14 DIAGNOSIS — G894 Chronic pain syndrome: Secondary | ICD-10-CM | POA: Insufficient documentation

## 2022-03-14 DIAGNOSIS — M5416 Radiculopathy, lumbar region: Secondary | ICD-10-CM | POA: Insufficient documentation

## 2022-03-14 DIAGNOSIS — M48062 Spinal stenosis, lumbar region with neurogenic claudication: Secondary | ICD-10-CM | POA: Insufficient documentation

## 2022-03-14 DIAGNOSIS — Z981 Arthrodesis status: Secondary | ICD-10-CM | POA: Diagnosis not present

## 2022-03-14 MED ORDER — HYDROCODONE-ACETAMINOPHEN 5-325 MG PO TABS: 1.0000 | ORAL_TABLET | Freq: Two times a day (BID) | ORAL | 0 refills | Status: DC | PRN

## 2022-03-14 MED ORDER — DULOXETINE HCL 20 MG PO CPEP: 20.0000 mg | ORAL_CAPSULE | Freq: Every day | ORAL | 0 refills | Status: DC

## 2022-03-14 NOTE — Progress Notes (Signed)
PROVIDER NOTE: Information contained herein reflects review and annotations entered in association with encounter. Interpretation of such information and data should be left to medically-trained personnel. Information provided to patient can be located elsewhere in the medical record under "Patient Instructions". Document created using STT-dictation technology, any transcriptional errors that may result from process are unintentional.  ?  ?Patient: Casey [name]  Service Category: E/M  Provider: Gillis Santa, MD  ?DOB: 19-Jan-1946  DOS: 03/14/2022  Specialty: Interventional Pain Management  ?MRN: [id number]  Setting: Ambulatory outpatient  PCP: Casey Koch, NP  ?Type: Established Patient    Referring Provider: Pleas Koch, NP  ?Location: Office  Delivery: Face-to-face    ? ?HPI  ?[name], a 76 y.o. year old female, is here today because of her Chronic radicular lumbar pain [Casey [name], Casey [name]]. [name] primary complain today is Back Pain (lower) ?Last encounter: My last encounter with her was on 03/09/2022. ?Pertinent problems: [name] has Chronic radicular lumbar pain; Lumbar radiculopathy; Lumbar facet arthropathy; Spinal stenosis, lumbar region, with neurogenic claudication; and Chronic pain syndrome on their pertinent problem list. ?Pain Assessment: Severity of Chronic pain is reported as a 10-Worst pain ever/10. Location: Back Right, Left/pain radiaites down both leg to her feet. Onset: More than a month ago. Quality: Constant, Aching, Pressure, Discomfort. Timing: Constant. Modifying factor(s): sitting on cushion, meds. ?Vitals:  height is _0  (1.6 m) and weight is 144 lb (65.3 kg). Her temperature is 97.4 ?F (36.3 ?C) (abnormal). Her blood pressure is 139/75 and her pulse is 103 (abnormal). Her oxygen saturation is 100%.  ? ?Reason for encounter: evaluation of worsening, or previously known (established) problem.  ? ? ?Patient's last visit with me was in June 2021.  She subsequently  went on to have L2-3 microdiscectomy, L4-5 XLIF 06/17/2020.  Unfortunately, she did not find benefit with the surgery and is experiencing low back pain with radiation into bilateral legs down to her feet with associated gait imbalance.  She has had lumbar epidural steroid injections with me in the past with limited response.  She is in moderate to severe pain.  At this point, I recommend follow-up imaging via lumbar MRI for further work-up.  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  In the interim, I recommend she start Casey [name] at 20 mg daily and I will also send in a prescription for Casey [name] that she can take for severe, breakthrough pain. ? ?I will have her complete a urine toxicology screen and sign a pain contract as well. ? ?She will follow-up with me in 4 weeks or earlier after she has completed her lumbar MRI to discuss treatment plan. ? ? ?ROS  ?Constitutional: Denies any fever or chills ?Gastrointestinal: No reported hemesis, hematochezia, vomiting, or acute GI distress ?Musculoskeletal:  Low back pain with radiation to bilateral lower extremity ?Neurological:  Paresthesias of bilateral lower extremity ? ?Medication Review  ?Adalimumab, DULoxetine, Elderberry, Casey [name]-acetaminophen, Melatonin, acetaminophen, cholecalciferol, etodolac, gabapentin, lidocaine, methocarbamol, multivitamin with minerals, polyethylene glycol, pravastatin, predniSONE, and vitamin C ? ?History Review  ?Allergy: [name] is allergic to leflunomide and methotrexate. ?Drug: [name]  reports no history of drug use. ?Alcohol:  reports no history of alcohol use. ?Tobacco:  reports that she has never smoked. She has never used smokeless tobacco. ?Social: [name]  reports that she has never smoked. She has never used smokeless tobacco. She reports that she does not drink alcohol and does not use drugs. ?  Medical:  has a past medical history of Allergy, Chronic back  pain, Herpes zoster without complication (48/18/5631), History of kidney stones, Osteoporosis, and Rheumatoid arthritis ([place]). ?Surgical: Ms. Pulse  has a past surgical history that includes Epidural block injection; Neck surgery; great toe (Left); Rotator cuff repair (Right); Anterior lateral lumbar fusion with percutaneous screw 1 level (N/A, 06/17/2020); and Lumbar laminectomy/decompression microdiscectomy (Right, 06/17/2020). ?Family: family history includes Bone cancer in her brother; Heart attack in her brother and mother; Mental illness in her mother. ? ?Laboratory Chemistry Profile  ? ?Renal ?Lab Results  ?Component Value Date  ? BUN 10 12/14/2021  ? CREATININE 0.79 12/14/2021  ? BCR NOT APPLICABLE 49/70/2637  ? GFR 72.17 12/14/2021  ? GFRAA >60 06/11/2020  ? GFRNONAA >60 06/11/2020  ?  Hepatic ?Lab Results  ?Component Value Date  ? AST 17 03/10/2022  ? ALT 7 03/10/2022  ? ALBUMIN 3.9 03/10/2022  ? ALKPHOS 57 03/10/2022  ?  ?Electrolytes ?Lab Results  ?Component Value Date  ? NA 140 12/14/2021  ? K 4.5 12/14/2021  ? CL 105 12/14/2021  ? CALCIUM 9.4 12/14/2021  ?  Bone ?No results found for: [place], [id number], [id number], CH8850YD7, 25OHVITD1, 25OHVITD2, 25OHVITD3, TESTOFREE, TESTOSTERONE  ?Inflammation (CRP: Acute Phase) (ESR: Chronic Phase) ?No results found for: CRP, ESRSEDRATE, LATICACIDVEN    ?  ? ?Note: Above Lab results reviewed. ? ?Recent Imaging Review  ?DG Thoracic Spine W/Swimmers ?CLINICAL DATA:  76 year old female with chronic back pain and is s/p ?lumbar fusion from July 2021. Now with new thoracic spine pain. No ?trauma. ? ?EXAM: ?THORACIC SPINE - 3 VIEWS ? ?COMPARISON:  None. ? ?FINDINGS: ?Twelve rib-bearing thoracic vertebral bodies are identified. ? ?No acute displaced fracture of the thoracic spine. Least mild ?multilevel degenerative changes of the cervical and thoracic spine. ?Alignment is normal. No other significant bone abnormalities are ?identified. ? ?IMPRESSION: ?No acute displaced  fracture or traumatic listhesis of the the ?thoracic spine. ? ?Electronically Signed ?  By: Iven Finn M.D. ?  On: 08/28/2020 23:30 ?DG HIP UNILAT WITH PELVIS 2-3 VIEWS RIGHT ?CLINICAL DATA:  76 year old female with chronic back pain, buttock ?pain, right hip pain. No trauma. S/p lumbar fusion in July 2021 ? ?EXAM: ?DG HIP (WITH OR WITHOUT PELVIS) 2-3V RIGHT ? ?COMPARISON:  X-ray lumbar spine 06/18/2020. ? ?FINDINGS: ?There is no evidence of hip fracture or dislocation of the right ?hip. Frontal evaluation of the left hip and pelvis are grossly ?unremarkable. Partially visualized sacrum is grossly unremarkable on ?the frontal view. Incomplete evaluation of surgerized L4-L5 level ?with inter pedicular screws, parallel interlocking rod, and and ?interbody cage. Suggestion of lucency surrounding the L5 pedicular ?screws is noted. There is no evidence of arthropathy or focal ?aggressive appearing bone abnormality. ? ?IMPRESSION: ?1. No acute displaced fracture or dislocation of the right hip. ?2. Suggestion of lucency surrounding L5 pedicular screws ?incompletely evaluated. ? ?Electronically Signed ?  By: Iven Finn M.D. ?  On: 08/28/2020 23:28 ?Note: Reviewed       ? ?Physical Exam  ?General appearance: Well nourished, well developed, and well hydrated. In no apparent acute distress ?Mental status: Alert, oriented x 3 (person, place, & time)       ?Respiratory: No evidence of acute respiratory distress ?Eyes: PERLA ?Vitals: BP 139/75   Pulse (!) 103   Temp (!) 97.4 ?F (36.3 ?C)   Ht _0  (1.6 m)   Wt 144 lb (65.3 kg)   SpO2 100%  BMI 25.51 kg/m?  ?BMI: Estimated body mass index is 25.51 kg/m? as calculated from the following: ?  Height as of this encounter: _0  (1.6 m). ?  Weight as of this encounter: 144 lb (65.3 kg). ?Ideal: Ideal body weight: 52.4 kg (115 lb 8.3 oz) ?Adjusted ideal body weight: 57.6 kg (126 lb 14.6 oz) ? ?Lumbar Exam  ?Skin & Axial Inspection: Healed scar from prior spine  surgery ?Alignment: Symmetrical ?Functional ROM: Decreased ROM affecting both sides ?Stability: No instability detected ?Muscle Tone/Strength: Functionally intact. No obvious neuro-muscular anomalies detected. ?Sensory

## 2022-03-14 NOTE — Progress Notes (Signed)
Safety precautions to be maintained throughout the outpatient stay will include: orient to surroundings, keep bed in low position, maintain call bell within reach at all times, provide assistance with transfer out of bed and ambulation.  

## 2022-03-15 ENCOUNTER — Other Ambulatory Visit: Payer: Self-pay | Admitting: Student in an Organized Health Care Education/Training Program

## 2022-03-16 ENCOUNTER — Telehealth: Payer: Self-pay | Admitting: Student in an Organized Health Care Education/Training Program

## 2022-03-16 ENCOUNTER — Other Ambulatory Visit: Payer: Self-pay | Admitting: *Deleted

## 2022-03-16 MED ORDER — HYDROCODONE-ACETAMINOPHEN 5-325 MG PO TABS: 1.0000 | ORAL_TABLET | Freq: Two times a day (BID) | ORAL | 0 refills | Status: AC | PRN

## 2022-03-16 NOTE — Telephone Encounter (Signed)
Attempting to send med request to [facility] on behalf of [facility] because patients pharmacy is out of stock.  Patient has left her phone at [facility] and the staff member there answered the phone.  Maybe the patient will return to get the phone.  Until then I am not able to accommodate this request since I can not verify the pharmacy.  ?

## 2022-03-16 NOTE — Telephone Encounter (Signed)
Patient cannot get Hydrocodone at [facility] but called [facility] at [address] and they have it. Please advise patient. She is leaving to go out of town tomorrow. ?

## 2022-03-17 NOTE — Telephone Encounter (Signed)
Rx for Hydrocodone sent to [facility] by Dr. Andree Elk. ?

## 2022-03-18 LAB — COMPLIANCE DRUG ANALYSIS, UR

## 2022-03-28 ENCOUNTER — Ambulatory Visit
Admission: RE | Admit: 2022-03-28 | Discharge: 2022-03-28 | Disposition: A | Discharge status: Home / Self Care | Payer: Medicare HMO | Source: Ambulatory Visit | Attending: Student in an Organized Health Care Education/Training Program | Admitting: Student in an Organized Health Care Education/Training Program

## 2022-03-28 DIAGNOSIS — M5416 Radiculopathy, lumbar region: Secondary | ICD-10-CM | POA: Diagnosis not present

## 2022-03-28 DIAGNOSIS — G8929 Other chronic pain: Secondary | ICD-10-CM | POA: Diagnosis present

## 2022-03-28 DIAGNOSIS — Z981 Arthrodesis status: Secondary | ICD-10-CM

## 2022-03-28 DIAGNOSIS — M48062 Spinal stenosis, lumbar region with neurogenic claudication: Secondary | ICD-10-CM | POA: Diagnosis present

## 2022-03-28 DIAGNOSIS — G894 Chronic pain syndrome: Secondary | ICD-10-CM | POA: Diagnosis present

## 2022-03-31 ENCOUNTER — Telehealth: Payer: Self-pay

## 2022-03-31 DIAGNOSIS — M5136 Other intervertebral disc degeneration, lumbar region: Secondary | ICD-10-CM

## 2022-03-31 DIAGNOSIS — M47816 Spondylosis without myelopathy or radiculopathy, lumbar region: Secondary | ICD-10-CM

## 2022-03-31 NOTE — Addendum Note (Signed)
Addended by: Doreene Nest on: 03/31/2022 05:14 PM ? ? Modules accepted: Orders ? ?

## 2022-03-31 NOTE — Telephone Encounter (Signed)
Patient would like referral to podiatry in [place]. No new problems just needs nails trimmed and not able to do.  ?

## 2022-03-31 NOTE — Telephone Encounter (Signed)
Noted, referral placed.  

## 2022-04-07 ENCOUNTER — Ambulatory Visit
Payer: Medicare HMO | Attending: Student in an Organized Health Care Education/Training Program | Admitting: Student in an Organized Health Care Education/Training Program

## 2022-04-07 ENCOUNTER — Encounter: Payer: Self-pay | Admitting: Student in an Organized Health Care Education/Training Program

## 2022-04-07 VITALS — BP 155/82 | HR 83 | Temp 97.5°F | Resp 16 | Ht 64.0 in | Wt 142.0 lb

## 2022-04-07 DIAGNOSIS — M5416 Radiculopathy, lumbar region: Secondary | ICD-10-CM

## 2022-04-07 DIAGNOSIS — G894 Chronic pain syndrome: Secondary | ICD-10-CM

## 2022-04-07 DIAGNOSIS — G8929 Other chronic pain: Secondary | ICD-10-CM | POA: Diagnosis present

## 2022-04-07 DIAGNOSIS — M48062 Spinal stenosis, lumbar region with neurogenic claudication: Secondary | ICD-10-CM

## 2022-04-07 DIAGNOSIS — Z981 Arthrodesis status: Secondary | ICD-10-CM

## 2022-04-07 NOTE — Patient Instructions (Signed)
GENERAL RISKS AND COMPLICATIONS ? ?What are the risk, side effects and possible complications? ?Generally speaking, most procedures are safe.  However, with any procedure there are risks, side effects, and the possibility of complications.  The risks and complications are dependent upon the sites that are lesioned, or the type of nerve block to be performed.  The closer the procedure is to the spine, the more serious the risks are.  Great care is taken when placing the radio frequency needles, block needles or lesioning probes, but sometimes complications can occur. ?Infection: Any time there is an injection through the skin, there is a risk of infection.  This is why sterile conditions are used for these blocks.  There are four possible types of infection. ?Localized skin infection. ?Central Nervous System Infection-This can be in the form of Meningitis, which can be deadly. ?Epidural Infections-This can be in the form of an epidural abscess, which can cause pressure inside of the spine, causing compression of the spinal cord with subsequent paralysis. This would require an emergency surgery to decompress, and there are no guarantees that the patient would recover from the paralysis. ?Discitis-This is an infection of the intervertebral discs.  It occurs in about 1% of discography procedures.  It is difficult to treat and it may lead to surgery. ? ?      2. Pain: the needles have to go through skin and soft tissues, will cause soreness. ?      3. Damage to internal structures:  The nerves to be lesioned may be near blood vessels or   ? other nerves which can be potentially damaged. ?      4. Bleeding: Bleeding is more common if the patient is taking blood thinners such as  aspirin, Coumadin, Ticiid, Plavix, etc., or if he/she have some genetic predisposition  such as hemophilia. Bleeding into the spinal canal can cause compression of the spinal  cord with subsequent paralysis.  This would require an emergency  surgery to  decompress and there are no guarantees that the patient would recover from the  paralysis. ?      5. Pneumothorax:  Puncturing of a lung is a possibility, every time a needle is introduced in  the area of the chest or upper back.  Pneumothorax refers to free air around the  collapsed lung(s), inside of the thoracic cavity (chest cavity).  Another two possible  complications related to a similar event would include: Hemothorax and Chylothorax.   These are variations of the Pneumothorax, where instead of air around the collapsed  lung(s), you may have blood or chyle, respectively. ?      6. Spinal headaches: They may occur with any procedures in the area of the spine. ?      7. Persistent CSF (Cerebro-Spinal Fluid) leakage: This is a rare problem, but may occur  with prolonged intrathecal or epidural catheters either due to the formation of a fistulous  track or a dural tear. ?      8. Nerve damage: By working so close to the spinal cord, there is always a possibility of  nerve damage, which could be as serious as a permanent spinal cord injury with  paralysis. ?      9. Death:  Although rare, severe deadly allergic reactions known as "Anaphylactic  reaction" can occur to any of the medications used. ?     10. Worsening of the symptoms:  We can always make thing worse. ? ?What are the chances   of something like this happening? ?Chances of any of this occuring are extremely low.  By statistics, you have more of a chance of getting killed in a motor vehicle accident: while driving to the hospital than any of the above occurring .  Nevertheless, you should be aware that they are possibilities.  In general, it is similar to taking a shower.  Everybody knows that you can slip, hit your head and get killed.  Does that mean that you should not shower again?  Nevertheless always keep in mind that statistics do not mean anything if you happen to be on the wrong side of them.  Even if a procedure has a 1 (one) in a  1,000,000 (million) chance of going wrong, it you happen to be that one..Also, keep in mind that by statistics, you have more of a chance of having something go wrong when taking medications. ? ?Who should not have this procedure? ?If you are on a blood thinning medication (e.g. Coumadin, Plavix, see list of "Blood Thinners"), or if you have an active infection going on, you should not have the procedure.  If you are taking any blood thinners, please inform your physician. ? ?How should I prepare for this procedure? ?Do not eat or drink anything at least six hours prior to the procedure. ?Bring a driver with you .  It cannot be a taxi. ?Come accompanied by an adult that can drive you back, and that is strong enough to help you if your legs get weak or numb from the local anesthetic. ?Take all of your medicines the morning of the procedure with just enough water to swallow them. ?If you have diabetes, make sure that you are scheduled to have your procedure done first thing in the morning, whenever possible. ?If you have diabetes, take only half of your insulin dose and notify our nurse that you have done so as soon as you arrive at the clinic. ?If you are diabetic, but only take blood sugar pills (oral hypoglycemic), then do not take them on the morning of your procedure.  You may take them after you have had the procedure. ?Do not take aspirin or any aspirin-containing medications, at least eleven (11) days prior to the procedure.  They may prolong bleeding. ?Wear loose fitting clothing that may be easy to take off and that you would not mind if it got stained with Betadine or blood. ?Do not wear any jewelry or perfume ?Remove any nail coloring.  It will interfere with some of our monitoring equipment. ? ?NOTE: Remember that this is not meant to be interpreted as a complete list of all possible complications.  Unforeseen problems may occur. ? ?BLOOD THINNERS ?The following drugs contain aspirin or other products,  which can cause increased bleeding during surgery and should not be taken for 2 weeks prior to and 1 week after surgery.  If you should need take something for relief of minor pain, you may take acetaminophen which is found in Tylenol,m Datril, Anacin-3 and Panadol. It is not blood thinner. The products listed below are.  Do not take any of the products listed below in addition to any listed on your instruction sheet. ? ?A.P.C or A.P.C with Codeine Codeine Phosphate Capsules #3 Ibuprofen Ridaura  ?ABC compound Congesprin Imuran rimadil  ?Advil Cope Indocin Robaxisal  ?Alka-Seltzer Effervescent Pain Reliever and Antacid Coricidin or Coricidin-D ? Indomethacin Rufen  ?Alka-Seltzer plus Cold Medicine Cosprin Ketoprofen S-A-C Tablets  ?Anacin Analgesic Tablets or Capsules Coumadin   Korlgesic Salflex  ?Anacin Extra Strength Analgesic tablets or capsules CP-2 Tablets Lanoril Salicylate  ?Anaprox Cuprimine Capsules Levenox Salocol  ?Anexsia-D Dalteparin Magan Salsalate  ?Anodynos Darvon compound Magnesium Salicylate Sine-off  ?Ansaid Dasin Capsules Magsal Sodium Salicylate  ?Anturane Depen Capsules Marnal Soma  ?APF Arthritis pain formula Dewitt's Pills Measurin Stanback  ?Argesic Dia-Gesic Meclofenamic Sulfinpyrazone  ?Arthritis Bayer Timed Release Aspirin Diclofenac Meclomen Sulindac  ?Arthritis pain formula Anacin Dicumarol Medipren Supac  ?Analgesic (Safety coated) Arthralgen Diffunasal Mefanamic Suprofen  ?Arthritis Strength Bufferin Dihydrocodeine Mepro Compound Suprol  ?Arthropan liquid Dopirydamole Methcarbomol with Aspirin Synalgos  ?ASA tablets/Enseals Disalcid Micrainin Tagament  ?Ascriptin Doan's Midol Talwin  ?Ascriptin A/D Dolene Mobidin Tanderil  ?Ascriptin Extra Strength Dolobid Moblgesic Ticlid  ?Ascriptin with Codeine Doloprin or Doloprin with Codeine Momentum Tolectin  ?Asperbuf Duoprin Mono-gesic Trendar  ?Aspergum Duradyne Motrin or Motrin IB Triminicin  ?Aspirin plain, buffered or enteric coated  Durasal Myochrisine Trigesic  ?Aspirin Suppositories Easprin Nalfon Trillsate  ?Aspirin with Codeine Ecotrin Regular or Extra Strength Naprosyn Uracel  ?Atromid-S Efficin Naproxen Ursinus  ?Auranofin Capsules Elmiro

## 2022-04-07 NOTE — Progress Notes (Signed)
Nursing Pain Medication Assessment:  ?Safety precautions to be maintained throughout the outpatient stay will include: orient to surroundings, keep bed in low position, maintain call bell within reach at all times, provide assistance with transfer out of bed and ambulation.  ?Medication Inspection Compliance: Pill count conducted under aseptic conditions, in front of the patient. Neither the pills nor the bottle was removed from the patient's sight at any time. Once count was completed pills were immediately returned to the patient in their original bottle. ? ?Medication: Hydrocodone/APAP ?Pill/Patch Count:  60 of 60 pills remain ?Pill/Patch Appearance: Markings consistent with prescribed medication ?Bottle Appearance: Standard pharmacy container. Clearly labeled. ?Filled Date: 04 / 19 / 2023 ?Last Medication intake:   has not taken any pills from this container ?

## 2022-04-07 NOTE — Progress Notes (Signed)
PROVIDER NOTE: Information contained herein reflects review and annotations entered in association with encounter. Interpretation of such information and data should be left to medically-trained personnel. Information provided to patient can be located elsewhere in the medical record under "Patient Instructions". Document created using STT-dictation technology, any transcriptional errors that may result from process are unintentional.  ?  ?Patient: Casey [name]  Service Category: E/M  Provider: Gillis Santa, MD  ?DOB: 1946-03-12  DOS: 04/07/2022  Specialty: Interventional Pain Management  ?MRN: [id number]  Setting: Ambulatory outpatient  PCP: Pleas Koch, NP  ?Type: Established Patient    Referring Provider: Pleas Koch, NP  ?Location: Office  Delivery: Face-to-face    ? ?HPI  ?[name], a 76 y.o. year old female, is here today because of her Chronic radicular lumbar pain [M54.16, G89.29]. Ms. Adcox primary complain today is Back Pain ?Last encounter: My last encounter with her was on 03/16/2022. ?Pertinent problems: Ms. Bittinger has Chronic radicular lumbar pain; Lumbar radiculopathy; Lumbar facet arthropathy; Spinal stenosis, lumbar region, with neurogenic claudication; and Chronic pain syndrome on their pertinent problem list. ?Pain Assessment: Severity of Chronic pain is reported as a 10-Worst pain ever/10. Location: Back Lower/through buttocks down both legs to feet. Onset:  . Quality: Aching, Constant, Discomfort, Pressure. Timing: Constant. Modifying factor(s): sitting on cushion. ?Vitals:  height is 5' 4"  (1.626 m) and weight is 142 lb (64.4 kg). Her temporal temperature is 97.5 ?F (36.4 ?C) (abnormal). Her blood pressure is 155/82 (abnormal) and her pulse is 83. Her respiration is 16 and oxygen saturation is 100%.  ? ?Reason for encounter: follow-up evaluation.  ? ?No change in her medical history since her last visit.  Has not utilize any hydrocodone that was prescribed to her for her  chronic pain.  Here to review her lumbar MRI. ? ?Please see HPI from previous clinic visit 03/14/2022: ?Patient's last visit with me was in June 2021.  She subsequently went on to have L2-3 microdiscectomy, L4-5 XLIF 06/17/2020.  Unfortunately, she did not find benefit with the surgery and is experiencing low back pain with radiation into bilateral legs down to her feet with associated gait imbalance.  She has had lumbar epidural steroid injections with me in the past with limited response.  She is in moderate to severe pain.  At this point, I recommend follow-up imaging via lumbar MRI for further work-up.  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  In the interim, I recommend she start Cymbalta at 20 mg daily and I will also send in a prescription for hydrocodone that she can take for severe, breakthrough pain. ?  ?I will have her complete a urine toxicology screen and sign a pain contract as well. ?  ?She will follow-up with me in 4 weeks or earlier after she has completed her lumbar MRI to discuss treatment plan. ?  ? ?ROS  ?Constitutional: Denies any fever or chills ?Gastrointestinal: No reported hemesis, hematochezia, vomiting, or acute GI distress ?Musculoskeletal:  Low back, bilateral leg pain, weakness ?Neurological:  Lower extremity paresthesias ? ?Medication Review  ?Adalimumab, DULoxetine, Elderberry, HYDROcodone-acetaminophen, Melatonin, acetaminophen, cholecalciferol, etodolac, gabapentin, lidocaine, methocarbamol, multivitamin with minerals, polyethylene glycol, pravastatin, predniSONE, and vitamin C ? ?History Review  ?Allergy: Ms. Mikkelsen is allergic to leflunomide and methotrexate. ?Drug: Ms. Perdew  reports no history of drug use. ?Alcohol:  reports no history of alcohol use. ?Tobacco:  reports that she has never smoked. She has never used smokeless  tobacco. ?Social: Ms. Mcilhenny  reports that she has never smoked. She has never used smokeless  tobacco. She reports that she does not drink alcohol and does not use drugs. ?Medical:  has a past medical history of Allergy, Chronic back pain, Herpes zoster without complication (63/87/5643), History of kidney stones, Osteoporosis, and Rheumatoid arthritis ([place]). ?Surgical: Ms. Lisanti  has a past surgical history that includes Epidural block injection; Neck surgery; great toe (Left); Rotator cuff repair (Right); Anterior lateral lumbar fusion with percutaneous screw 1 level (N/A, 06/17/2020); and Lumbar laminectomy/decompression microdiscectomy (Right, 06/17/2020). ?Family: family history includes Bone cancer in her brother; Heart attack in her brother and mother; Mental illness in her mother. ? ?Laboratory Chemistry Profile  ? ?Renal ?Lab Results  ?Component Value Date  ? BUN 10 12/14/2021  ? CREATININE 0.79 12/14/2021  ? BCR NOT APPLICABLE 32/95/1884  ? GFR 72.17 12/14/2021  ? GFRAA >60 06/11/2020  ? GFRNONAA >60 06/11/2020  ?  Hepatic ?Lab Results  ?Component Value Date  ? AST 17 03/10/2022  ? ALT 7 03/10/2022  ? ALBUMIN 3.9 03/10/2022  ? ALKPHOS 57 03/10/2022  ?  ?Electrolytes ?Lab Results  ?Component Value Date  ? NA 140 12/14/2021  ? K 4.5 12/14/2021  ? CL 105 12/14/2021  ? CALCIUM 9.4 12/14/2021  ?  Bone ?No results found for: [place], [id number], [id number], [id number], 25OHVITD1, 25OHVITD2, 25OHVITD3, TESTOFREE, TESTOSTERONE  ?Inflammation (CRP: Acute Phase) (ESR: Chronic Phase) ?No results found for: CRP, ESRSEDRATE, LATICACIDVEN    ?  ? ?Note: Above Lab results reviewed. ? ?Recent Imaging Review  ?MR LUMBAR SPINE WO CONTRAST ?CLINICAL DATA:  Low back pain with symptoms persisting over 6 weeks. ?Lumbar radiculopathy with pain extending down both legs for years ? ?EXAM: ?MRI LUMBAR SPINE WITHOUT CONTRAST ? ?TECHNIQUE: ?Multiplanar, multisequence MR imaging of the lumbar spine was ?performed. No intravenous contrast was administered. ? ?COMPARISON:  08/17/2020 ? ?FINDINGS: ?Segmentation:  5 lumbar type  vertebrae as previously established ? ?Alignment: Levoscoliosis and fused L4-5 anterolisthesis. Stable mild ?L5-S1 anterolisthesis ? ?Vertebrae:  No fracture, evidence of discitis, or bone lesion. ? ?Conus medullaris and cauda equina: Conus extends to the L1 level. ?Conus and cauda equina appear normal. ? ?Paraspinal and other soft tissues: Negative for perispinal mass or ?inflammation ? ?Disc levels: ? ?T12- L1: Degenerative facet spurring asymmetric to the left. Mild ?left foraminal narrowing ? ?L1-L2: Degenerative facet spurring on the left more than right ? ?L2-L3: Disc collapse with disc bulging and right paracentral to ?foraminal protrusion. Asymmetric right facet spurring. Right ?foraminal impingement. Right subarticular recess narrowing that is ?noncompressive after right laminotomy. ? ?L3-L4: Disc narrowing and bulging with mild facet spurring ? ?L4-L5: PLIF. Bilateral subarticular recess stenosis primarily from ?anterolisthesis and facet hypertrophy, impinging on both descending ?L5 nerve roots. Patent foramina ? ?L5-S1:Facet osteoarthritis with mild anterolisthesis. Mild disc ?bulging. No neural impingement. ? ?IMPRESSION: ?1. 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 No change since 2020. ?2. L2-3 right foraminal impingement. ?3. L4-5 bilateral subarticular recess stenosis and L5 impingement. ? ?Electronically Signed ?  By: Jorje Guild M.D. ?  On: 03/29/2022 06:09 ?Note: Reviewed       ? ?Physical Exam  ?General appearance: Well nourished, well developed, and well hydrated. In no apparent acute distress ?Mental status: Alert, oriented x 3 (person, place, & time)       ?Respiratory: No evidence of acute respiratory distress ?Eyes: PERLA ?Vitals: BP (!) 155/82   Pulse 83   Temp (!)  97.5 ?F (36.4 ?C) (Temporal)   Resp 16   Ht 5' 4"  (1.626 m)   Wt 142 lb (64.4 kg)   SpO2 100%   BMI 24.37 kg/m?  ?BMI: Estimated body mass index is 24.37 kg/m? as  calculated from the following: ?  Height as of this encounter: 5' 4"  (1.626 m). ?  Weight as of this encounter: 142 lb (64.4 kg). ?Ideal: Ideal body weight: 54.7 kg (120 lb 9.5 oz) ?Adjusted ideal body weight: 58.6 kg (129 lb 2

## 2022-04-19 ENCOUNTER — Encounter: Payer: Self-pay | Admitting: Podiatry

## 2022-04-19 ENCOUNTER — Ambulatory Visit: Payer: Medicare HMO | Admitting: Podiatry

## 2022-04-19 DIAGNOSIS — M79674 Pain in right toe(s): Secondary | ICD-10-CM

## 2022-04-19 DIAGNOSIS — M79675 Pain in left toe(s): Secondary | ICD-10-CM | POA: Diagnosis not present

## 2022-04-19 DIAGNOSIS — B351 Tinea unguium: Secondary | ICD-10-CM

## 2022-04-19 NOTE — Progress Notes (Signed)
   SUBJECTIVE Patient presents to office today complaining of elongated, thickened nails that cause pain while ambulating in shoes.  Patient is unable to trim their own nails. Patient is here for further evaluation and treatment.  Past Medical History:  Diagnosis Date   Allergy    Chronic back pain    Herpes zoster without complication 10/09/2020   History of kidney stones    Osteoporosis    Rheumatoid arthritis ([facility])     OBJECTIVE General Patient is awake, alert, and oriented x 3 and in no acute distress. Derm Skin is dry and supple bilateral. Negative open lesions or macerations. Remaining integument unremarkable. Nails are tender, long, thickened and dystrophic with subungual debris, consistent with onychomycosis, 1-5 bilateral. No signs of infection noted. Vasc  DP and PT pedal pulses palpable bilaterally. Temperature gradient within normal limits.  Neuro Epicritic and protective threshold sensation grossly intact bilaterally.  Musculoskeletal Exam No symptomatic pedal deformities noted bilateral. Muscular strength within normal limits.  ASSESSMENT 1.  Pain due to onychomycosis of toenails both  PLAN OF CARE 1. Patient evaluated today.  2. Instructed to maintain good pedal hygiene and foot care.  3. Mechanical debridement of nails 1-5 bilaterally performed using a nail nipper. Filed with dremel without incident.  4. Return to clinic in 3 mos.    Aislinn Feliz M. Polsky, DPM [facility]  Dr. Latrecia Capito M. Alton, DPM    [address]                                     [place], [place] [address]                Office [phone]  Fax [phone]    

## 2022-04-20 ENCOUNTER — Ambulatory Visit: Payer: Medicare HMO | Admitting: Student in an Organized Health Care Education/Training Program

## 2022-04-22 ENCOUNTER — Telehealth: Payer: Self-pay | Admitting: Primary Care

## 2022-04-22 NOTE — Telephone Encounter (Signed)
Pt wanted the nurse to know that "she is requesting a shingles shot, she has [facility] and she looked it up and stated they would cover it." Please return a call back when possible, thanks.  Callback Number: [phone]

## 2022-04-22 NOTE — Telephone Encounter (Signed)
Mail box full. Not able to reach patient. Need to let her know the following.  It is covered if filled under pharmacy benefits. We are not able to file under pharmacy. She will need to get at pharmacy

## 2022-05-02 NOTE — Telephone Encounter (Signed)
Called patient information given she will have done at pharmacy.

## 2022-06-01 ENCOUNTER — Ambulatory Visit (INDEPENDENT_AMBULATORY_CARE_PROVIDER_SITE_OTHER): Payer: Medicare HMO

## 2022-06-01 ENCOUNTER — Telehealth: Payer: Self-pay | Admitting: Student in an Organized Health Care Education/Training Program

## 2022-06-01 VITALS — Ht 64.0 in | Wt 148.0 lb

## 2022-06-01 DIAGNOSIS — Z0000 Encounter for general adult medical examination without abnormal findings: Secondary | ICD-10-CM | POA: Diagnosis not present

## 2022-06-01 DIAGNOSIS — G8929 Other chronic pain: Secondary | ICD-10-CM

## 2022-06-01 DIAGNOSIS — M48062 Spinal stenosis, lumbar region with neurogenic claudication: Secondary | ICD-10-CM

## 2022-06-01 NOTE — Progress Notes (Signed)
Subjective:   Casey [name] is a 76 y.o. female who presents for Medicare Annual (Subsequent) preventive examination.  Review of Systems    Virtual Visit via Telephone Note  I connected with  [name] on 06/01/22 at 11:30 AM EDT by telephone and verified that I am speaking with the correct person using two identifiers.  Location: Patient: Home Provider: Office Persons participating in the virtual visit: patient/Nurse Health Advisor   I discussed the limitations, risks, security and privacy concerns of performing an evaluation and management service by telephone and the availability of in person appointments. The patient expressed understanding and agreed to proceed.  Interactive audio and video telecommunications were attempted between this nurse and patient, however failed, due to patient having technical difficulties OR patient did not have access to video capability.  We continued and completed visit with audio only.  Some vital signs may be absent or patient reported.   Tillie Rung, LPN  Cardiac Risk Factors include: advanced age (>6men, >96 women)     Objective:    Today's Vitals   06/01/22 1123  Weight: 148 lb (67.1 kg)  Height: 5\' 4"  (1.626 m)   Body mass index is 25.4 kg/m.     06/01/2022   11:35 AM 03/04/2022    9:26 AM 06/17/2020   12:30 PM 06/11/2020    9:31 AM 05/21/2020    2:02 PM 04/29/2020   10:27 AM 04/04/2020    3:09 PM  Advanced Directives  Does Patient Have a Medical Advance Directive? Yes No Yes Yes Yes Yes Yes  Type of 06/04/2020 of [place];Living will  Healthcare Power of [place];Living will Healthcare Power of [place];Living will Healthcare Power of [place];Living will Living will Healthcare Power of [place];Living will  Does patient want to make changes to medical advance directive? No - Patient declined  No - Guardian declined No - Patient declined   No - Patient declined  Copy of Healthcare Power of Attorney  in Chart? No - copy requested  No - copy requested No - copy requested No - copy requested  No - copy requested  Would patient like information on creating a medical advance directive?  No - Patient declined         Current Medications (verified) Outpatient Encounter Medications as of 06/01/2022  Medication Sig   acetaminophen (TYLENOL) 500 MG tablet Take 2 tablets (1,000 mg total) by mouth every 6 (six) hours as needed for mild pain.   Adalimumab 40 MG/0.8ML PNKT Inject 0.8 mLs into the skin every 14 (fourteen) days.   Ascorbic Acid (VITAMIN C) 1000 MG tablet Take 1,000 mg by mouth daily.   cholecalciferol (VITAMIN D) 25 MCG (1000 UNIT) tablet Take 1,000 Units by mouth daily.   DULoxetine (CYMBALTA) 20 MG capsule Take 1 capsule (20 mg total) by mouth daily.   ELDERBERRY PO Take 1 tablet by mouth daily.   etodolac (LODINE) 400 MG tablet Take 400 mg by mouth 2 (two) times daily.   gabapentin (NEURONTIN) 300 MG capsule TAKE 1 CAPSULE BY MOUTH THREE TIMES A DAY   lidocaine (LIDODERM) 5 % Place 1 patch onto the skin daily. Remove & Discard patch within 12 hours or as directed by MD   Melatonin 10 MG TABS Take 10 mg by mouth at bedtime as needed (sleep.).   methocarbamol (ROBAXIN) 500 MG tablet TAKE 1.5 TABLETS (750 MG TOTAL) BY MOUTH EVERY 6 (SIX) HOURS.   Multiple Vitamin (MULTIVITAMIN WITH MINERALS) TABS tablet  Take 1 tablet by mouth daily. Centrum Silver   polyethylene glycol (MIRALAX / GLYCOLAX) 17 g packet Take 17 g by mouth daily as needed for mild constipation.   pravastatin (PRAVACHOL) 40 MG tablet Take 1 tablet (40 mg total) by mouth daily. For cholesterol   predniSONE (DELTASONE) 5 MG tablet Take 2.5 mg by mouth daily.   No facility-administered encounter medications on file as of 06/01/2022.    Allergies (verified) Leflunomide and Methotrexate   History: Past Medical History:  Diagnosis Date   Allergy    Chronic back pain    Herpes zoster without complication 10/09/2020    History of kidney stones    Osteoporosis    Rheumatoid arthritis (HCC)    Past Surgical History:  Procedure Laterality Date   ANTERIOR LATERAL LUMBAR FUSION WITH PERCUTANEOUS SCREW 1 LEVEL N/A 06/17/2020   Procedure: L4-5 EXTREME LATERAL INTERBODY FUSION(XLIF)/POSTERIOR SPINAL FUSION (PSF);  Surgeon: Venetia Night, MD;  Location: [facility] ORS;  Service: Neurosurgery;  Laterality: N/A;   EPIDURAL BLOCK INJECTION     great toe Left    straightened and pinned   LUMBAR LAMINECTOMY/DECOMPRESSION MICRODISCECTOMY Right 06/17/2020   Procedure: RIGHT L2-3 MICRODISCECTOMY;  Surgeon: Venetia Night, MD;  Location: [facility] ORS;  Service: Neurosurgery;  Laterality: Right;   NECK SURGERY     ROTATOR CUFF REPAIR Right    Family History  Problem Relation Age of Onset   Heart attack Mother    Mental illness Mother    Bone cancer Brother    Heart attack Brother    Social History   Socioeconomic History   Marital status: Widowed    Spouse name: Not on file   Number of children: Not on file   Years of education: Not on file   Highest education level: Not on file  Occupational History   Not on file  Tobacco Use   Smoking status: Never   Smokeless tobacco: Never  Vaping Use   Vaping Use: Never used  Substance and Sexual Activity   Alcohol use: Never   Drug use: Never   Sexual activity: Not on file  Other Topics Concern   Not on file  Social History Narrative   Not on file   Social Determinants of Health   Financial Resource Strain: Low Risk  (06/01/2022)   Overall Financial Resource Strain (CARDIA)    Difficulty of Paying Living Expenses: Not hard at all  Food Insecurity: No Food Insecurity (06/01/2022)   Hunger Vital Sign    Worried About Running Out of Food in the Last Year: Never true    Ran Out of Food in the Last Year: Never true  Transportation Needs: No Transportation Needs (06/01/2022)   PRAPARE - Administrator, Civil Service (Medical): No    Lack of Transportation  (Non-Medical): No  Physical Activity: Insufficiently Active (06/01/2022)   Exercise Vital Sign    Days of Exercise per Week: 1 day    Minutes of Exercise per Session: 10 min  Stress: No Stress Concern Present (06/01/2022)   [employer] of Occupational Health - Occupational Stress Questionnaire    Feeling of Stress : Not at all  Social Connections: Moderately Integrated (06/01/2022)   Social Connection and Isolation Panel [NHANES]    Frequency of Communication with Friends and Family: More than three times a week    Frequency of Social Gatherings with Friends and Family: More than three times a week    Attends Religious Services: More than 4 times per year  Active Member of Clubs or Organizations: Yes    Attends Banker Meetings: More than 4 times per year    Marital Status: Widowed    Tobacco Counseling Counseling given: Not Answered   Clinical Intake:  Pre-visit preparation completed: No  Diabetic?  No    Activities of Daily Living    06/01/2022   11:33 AM 12/14/2021   12:12 PM  In your present state of health, do you have any difficulty performing the following activities:  Hearing? 0 0  Vision? 0 0  Difficulty concentrating or making decisions? 0 0  Walking or climbing stairs? 0 0  Dressing or bathing? 0 0  Doing errands, shopping? 0 0  Preparing Food and eating ? N   Using the Toilet? N   In the past six months, have you accidently leaked urine? N   Do you have problems with loss of bowel control? N   Managing your Medications? N   Managing your Finances? N   Housekeeping or managing your Housekeeping? N     Patient Care Team: Doreene Nest, NP as PCP - General (Internal Medicine)  Indicate any recent Medical Services you may have received from other than [facility] providers in the past year (date may be approximate).     Assessment:   This is a routine wellness examination for [place].  Hearing/Vision screen Hearing Screening - Comments::  No hearing difficulty Vision Screening - Comments:: Wears glasses. Followed by [facility]  Dietary issues and exercise activities discussed: Exercise limited by: None identified   Goals Addressed               This Visit's Progress     Increase physical activity (pt-stated)         Depression Screen    06/01/2022   11:31 AM 12/14/2021   12:12 PM 05/21/2020    2:03 PM 04/29/2020   10:27 AM  PHQ 2/9 Scores  PHQ - 2 Score 0 0 0 0  PHQ- 9 Score  0 0     Fall Risk    06/01/2022   11:34 AM 04/07/2022   10:48 AM 03/14/2022    1:12 PM 12/14/2021   12:12 PM 05/21/2020    2:02 PM  Fall Risk   Falls in the past year? 0 0 0 0 0  Number falls in past yr: 0   0 0  Injury with Fall? 0   0 0  Risk for fall due to : No Fall Risks    Medication side effect  Follow up     Falls evaluation completed;Falls prevention discussed    FALL RISK PREVENTION PERTAINING TO THE HOME:  Any stairs in or around the home? Yes  If so, are there any without handrails? No  Home free of loose throw rugs in walkways, pet beds, electrical cords, etc? Yes  Adequate lighting in your home to reduce risk of falls? Yes   ASSISTIVE DEVICES UTILIZED TO PREVENT FALLS:  Life alert? No  Use of a cane, walker or w/c? No  Grab bars in the bathroom? No  Shower chair or bench in shower? Yes  Elevated toilet seat or a handicapped toilet? No   TIMED UP AND GO:  Was the test performed? No . Audio Visit   Cognitive Function:    05/21/2020    2:05 PM  MMSE - Mini Mental State Exam  Orientation to time 5  Orientation to Place 5  Registration 3  Attention/ Calculation  5  Recall 3  Language- repeat 1        06/01/2022   11:38 AM  6CIT Screen  What Year? 0 points  What month? 0 points  What time? 0 points  Count back from 20 0 points  Months in reverse 0 points  Repeat phrase 0 points  Total Score 0 points    Immunizations Immunization History  Administered Date(s) Administered   Influenza,  Seasonal, Injecte, Preservative Fre 09/15/2011   PFIZER(Purple Top)SARS-COV-2 Vaccination 12/23/2019, 01/13/2020   PPD Test 09/16/2015, 09/16/2015   Pneumococcal Conjugate-13 12/22/2016   Pneumococcal Polysaccharide-23 11/29/2011   Tdap 07/16/2018    TDAP status: Up to date  Flu Vaccine status: Declined, Education has been provided regarding the importance of this vaccine but patient still declined. Advised may receive this vaccine at local pharmacy or Health Dept. Aware to provide a copy of the vaccination record if obtained from local pharmacy or Health Dept. Verbalized acceptance and understanding.  Pneumococcal vaccine status: Completed during today's visit.  Covid-19 vaccine status: Completed vaccines  Qualifies for Shingles Vaccine? Yes   Zostavax completed No   Shingrix Completed?: No.    Education has been provided regarding the importance of this vaccine. Patient has been advised to call insurance company to determine out of pocket expense if they have not yet received this vaccine. Advised may also receive vaccine at local pharmacy or Health Dept. Verbalized acceptance and understanding.  Screening Tests Health Maintenance  Topic Date Due   COVID-19 Vaccine (3 - Pfizer risk series) 06/17/2022 (Originally 02/10/2020)   Zoster Vaccines- Shingrix (1 of 2) 09/01/2022 (Originally 07/30/1965)   Hepatitis C Screening  06/02/2023 (Originally 07/29/1964)   INFLUENZA VACCINE  06/28/2022   TETANUS/TDAP  07/16/2028   Pneumonia Vaccine 36+ Years old  Completed   DEXA SCAN  Completed   HPV VACCINES  Aged Out    Health Maintenance  There are no preventive care reminders to display for this patient.  Bone Density status: Ordered 12/14/20. Pt provided with contact info and advised to call to schedule appt.  Lung Cancer Screening: (Low Dose CT Chest recommended if Age 67-80 years, 30 pack-year currently smoking OR have quit w/in 15years.) does not qualify.     Additional  Screening:  Hepatitis C Screening: does qualify; Completed Patient deferred  Vision Screening: Recommended annual ophthalmology exams for early detection of glaucoma and other disorders of the eye. Is the patient up to date with their annual eye exam?  Yes  Who is the provider or what is the name of the office in which the patient attends annual eye exams? [facility] If pt is not established with a provider, would they like to be referred to a provider to establish care? No .   Dental Screening: Recommended annual dental exams for proper oral hygiene  Community Resource Referral / Chronic Care Management:  CRR required this visit?  No   CCM required this visit?  No      Plan:     I have personally reviewed and noted the following in the patient's chart:   Medical and social history Use of alcohol, tobacco or illicit drugs  Current medications and supplements including opioid prescriptions.  Functional ability and status Nutritional status Physical activity Advanced directives List of other physicians Hospitalizations, surgeries, and ER visits in previous 12 months Vitals Screenings to include cognitive, depression, and falls Referrals and appointments  In addition, I have reviewed and discussed with patient certain preventive protocols, quality  metrics, and best practice recommendations. A written personalized care plan for preventive services as well as general preventive health recommendations were provided to patient.     Tillie Rung, LPN   06/02/2262   Nurse Notes: None

## 2022-06-01 NOTE — Telephone Encounter (Signed)
Patient stated she is in a lot of pain. Patient wants to see about coming in for an epidural. Patient was schedule for an epidural appt back in May but cancel. Please give patient a call. Thanks

## 2022-06-01 NOTE — Telephone Encounter (Signed)
Called patient and she is requesting an ESI related to back pain. Patient was schedule one in May but cancelled appt. Would you like to see her or can you put in order for Casey [name] to have ESI. IF procedure please send to front desk so they can schedule. Thanks

## 2022-06-01 NOTE — Patient Instructions (Addendum)
Casey [name] , Thank you for taking time to come for your Medicare Wellness Visit. I appreciate your ongoing commitment to your health goals. Please review the following plan we discussed and let me know if I can assist you in the future.   These are the goals we discussed:  Goals       Increase physical activity (pt-stated)      Patient Stated      05/21/2020, I will maintain and continue medications as prescribed.         This is a list of the screening recommended for you and due dates:  Health Maintenance  Topic Date Due   COVID-19 Vaccine (3 - Pfizer risk series) 06/17/2022*   Zoster (Shingles) Vaccine (1 of 2) 09/01/2022*   Hepatitis C Screening: USPSTF Recommendation to screen - Ages 18-79 yo.  06/02/2023*   Flu Shot  06/28/2022   Tetanus Vaccine  07/16/2028   Pneumonia Vaccine  Completed   DEXA scan (bone density measurement)  Completed   HPV Vaccine  Aged Out  *Topic was postponed. The date shown is not the original due date.   Advanced directives: Yes  Conditions/risks identified: None  Next appointment: Follow up in one year for your annual wellness visit      Preventive Care 65 Years and Older, Female Preventive care refers to lifestyle choices and visits with your health care provider that can promote health and wellness. What does preventive care include? A yearly physical exam. This is also called an annual well check. Dental exams once or twice a year. Routine eye exams. Ask your health care provider how often you should have your eyes checked. Personal lifestyle choices, including: Daily care of your teeth and gums. Regular physical activity. Eating a healthy diet. Avoiding tobacco and drug use. Limiting alcohol use. Practicing safe sex. Taking low-dose aspirin every day. Taking vitamin and mineral supplements as recommended by your health care provider. What happens during an annual well check? The services and screenings done by your health care  provider during your annual well check will depend on your age, overall health, lifestyle risk factors, and family history of disease. Counseling  Your health care provider may ask you questions about your: Alcohol use. Tobacco use. Drug use. Emotional well-being. Home and relationship well-being. Sexual activity. Eating habits. History of falls. Memory and ability to understand (cognition). Work and work Astronomer. Reproductive health. Screening  You may have the following tests or measurements: Height, weight, and BMI. Blood pressure. Lipid and cholesterol levels. These may be checked every 5 years, or more frequently if you are over 54 years old. Skin check. Lung cancer screening. You may have this screening every year starting at age 24 if you have a 30-pack-year history of smoking and currently smoke or have quit within the past 15 years. Fecal occult blood test (FOBT) of the stool. You may have this test every year starting at age 49. Flexible sigmoidoscopy or colonoscopy. You may have a sigmoidoscopy every 5 years or a colonoscopy every 10 years starting at age 36. Hepatitis C blood test. Hepatitis B blood test. Sexually transmitted disease (STD) testing. Diabetes screening. This is done by checking your blood sugar (glucose) after you have not eaten for a while (fasting). You may have this done every 1-3 years. Bone density scan. This is done to screen for osteoporosis. You may have this done starting at age 24. Mammogram. This may be done every 1-2 years. Talk to your health care provider  about how often you should have regular mammograms. Talk with your health care provider about your test results, treatment options, and if necessary, the need for more tests. Vaccines  Your health care provider may recommend certain vaccines, such as: Influenza vaccine. This is recommended every year. Tetanus, diphtheria, and acellular pertussis (Tdap, Td) vaccine. You may need a Td  booster every 10 years. Zoster vaccine. You may need this after age 30. Pneumococcal 13-valent conjugate (PCV13) vaccine. One dose is recommended after age 53. Pneumococcal polysaccharide (PPSV23) vaccine. One dose is recommended after age 103. Talk to your health care provider about which screenings and vaccines you need and how often you need them. This information is not intended to replace advice given to you by your health care provider. Make sure you discuss any questions you have with your health care provider. Document Released: 12/11/2015 Document Revised: 08/03/2016 Document Reviewed: 09/15/2015 Elsevier Interactive Patient Education  2017 [place] Prevention in the Home Falls can cause injuries. They can happen to people of all ages. There are many things you can do to make your home safe and to help prevent falls. What can I do on the outside of my home? Regularly fix the edges of walkways and driveways and fix any cracks. Remove anything that might make you trip as you walk through a door, such as a raised step or threshold. Trim any bushes or trees on the path to your home. Use bright outdoor lighting. Clear any walking paths of anything that might make someone trip, such as rocks or tools. Regularly check to see if handrails are loose or broken. Make sure that both sides of any steps have handrails. Any raised decks and porches should have guardrails on the edges. Have any leaves, snow, or ice cleared regularly. Use sand or salt on walking paths during winter. Clean up any spills in your garage right away. This includes oil or grease spills. What can I do in the bathroom? Use night lights. Install grab bars by the toilet and in the tub and shower. Do not use towel bars as grab bars. Use non-skid mats or decals in the tub or shower. If you need to sit down in the shower, use a plastic, non-slip stool. Keep the floor dry. Clean up any water that spills on the floor  as soon as it happens. Remove soap buildup in the tub or shower regularly. Attach bath mats securely with double-sided non-slip rug tape. Do not have throw rugs and other things on the floor that can make you trip. What can I do in the bedroom? Use night lights. Make sure that you have a light by your bed that is easy to reach. Do not use any sheets or blankets that are too big for your bed. They should not hang down onto the floor. Have a firm chair that has side arms. You can use this for support while you get dressed. Do not have throw rugs and other things on the floor that can make you trip. What can I do in the kitchen? Clean up any spills right away. Avoid walking on wet floors. Keep items that you use a lot in easy-to-reach places. If you need to reach something above you, use a strong step stool that has a grab bar. Keep electrical cords out of the way. Do not use floor polish or wax that makes floors slippery. If you must use wax, use non-skid floor wax. Do not have throw rugs and  other things on the floor that can make you trip. What can I do with my stairs? Do not leave any items on the stairs. Make sure that there are handrails on both sides of the stairs and use them. Fix handrails that are broken or loose. Make sure that handrails are as long as the stairways. Check any carpeting to make sure that it is firmly attached to the stairs. Fix any carpet that is loose or worn. Avoid having throw rugs at the top or bottom of the stairs. If you do have throw rugs, attach them to the floor with carpet tape. Make sure that you have a light switch at the top of the stairs and the bottom of the stairs. If you do not have them, ask someone to add them for you. What else can I do to help prevent falls? Wear shoes that: Do not have high heels. Have rubber bottoms. Are comfortable and fit you well. Are closed at the toe. Do not wear sandals. If you use a stepladder: Make sure that it is  fully opened. Do not climb a closed stepladder. Make sure that both sides of the stepladder are locked into place. Ask someone to hold it for you, if possible. Clearly mark and make sure that you can see: Any grab bars or handrails. First and last steps. Where the edge of each step is. Use tools that help you move around (mobility aids) if they are needed. These include: Canes. Walkers. Scooters. Crutches. Turn on the lights when you go into a dark area. Replace any light bulbs as soon as they burn out. Set up your furniture so you have a clear path. Avoid moving your furniture around. If any of your floors are uneven, fix them. If there are any pets around you, be aware of where they are. Review your medicines with your doctor. Some medicines can make you feel dizzy. This can increase your chance of falling. Ask your doctor what other things that you can do to help prevent falls. This information is not intended to replace advice given to you by your health care provider. Make sure you discuss any questions you have with your health care provider. Document Released: 09/10/2009 Document Revised: 04/21/2016 Document Reviewed: 12/19/2014 Elsevier Interactive Patient Education  2017 [employer].

## 2022-06-02 NOTE — Addendum Note (Signed)
Addended by: Edward Jolly on: 06/02/2022 08:32 AM   Modules accepted: Orders

## 2022-06-07 ENCOUNTER — Telehealth: Payer: Self-pay | Admitting: Student in an Organized Health Care Education/Training Program

## 2022-06-07 NOTE — Telephone Encounter (Signed)
Patient stated that she is in pain and wants to see about an epidural. Please give patient a call. Thanks

## 2022-06-07 NOTE — Telephone Encounter (Signed)
An epidural was ordered on 06-02-22

## 2022-06-08 ENCOUNTER — Telehealth: Payer: Self-pay

## 2022-06-08 NOTE — Telephone Encounter (Signed)
Patient notified

## 2022-06-08 NOTE — Telephone Encounter (Signed)
The patient was scheduled for an LESI on 5/22 but she cancelled stating she didn't want to have it done. While I was out sick she called in wanting to have it done.Her authorization was still valid so I called her this morning to schedue, got her scheduled for 7/24 which is Dr. Laurey Morale first available opening.  She is upset about having to wait so long and wants a nurse to call her back and tell her what she can do for the pain until then.

## 2022-06-20 ENCOUNTER — Ambulatory Visit: Payer: Medicare HMO | Admitting: Student in an Organized Health Care Education/Training Program

## 2022-06-27 ENCOUNTER — Ambulatory Visit
Payer: Medicare HMO | Attending: Student in an Organized Health Care Education/Training Program | Admitting: Student in an Organized Health Care Education/Training Program

## 2022-06-27 ENCOUNTER — Ambulatory Visit
Admission: RE | Admit: 2022-06-27 | Discharge: 2022-06-27 | Disposition: A | Discharge status: Home / Self Care | Payer: Medicare HMO | Source: Ambulatory Visit | Attending: Student in an Organized Health Care Education/Training Program | Admitting: Student in an Organized Health Care Education/Training Program

## 2022-06-27 ENCOUNTER — Encounter: Payer: Self-pay | Admitting: Student in an Organized Health Care Education/Training Program

## 2022-06-27 VITALS — BP 164/98 | HR 83 | Temp 97.3°F | Resp 18 | Ht 63.0 in | Wt 142.0 lb

## 2022-06-27 DIAGNOSIS — M5416 Radiculopathy, lumbar region: Secondary | ICD-10-CM | POA: Diagnosis present

## 2022-06-27 DIAGNOSIS — M48062 Spinal stenosis, lumbar region with neurogenic claudication: Secondary | ICD-10-CM | POA: Diagnosis not present

## 2022-06-27 DIAGNOSIS — G8929 Other chronic pain: Secondary | ICD-10-CM | POA: Insufficient documentation

## 2022-06-27 MED ORDER — SODIUM CHLORIDE (PF) 0.9 % IJ SOLN
INTRAMUSCULAR | Status: AC
Start: 2022-06-27 — End: ?
  Filled 2022-06-27: qty 10

## 2022-06-27 MED ORDER — LIDOCAINE HCL 2 % IJ SOLN
20.0000 mL | Freq: Once | INTRAMUSCULAR | Status: AC
  Administered 2022-06-27: 100 mg

## 2022-06-27 MED ORDER — DEXAMETHASONE SODIUM PHOSPHATE 10 MG/ML IJ SOLN
INTRAMUSCULAR | Status: AC
  Filled 2022-06-27: qty 1

## 2022-06-27 MED ORDER — ROPIVACAINE HCL 2 MG/ML IJ SOLN
2.0000 mL | Freq: Once | INTRAMUSCULAR | Status: AC
  Administered 2022-06-27: 2 mL via EPIDURAL

## 2022-06-27 MED ORDER — DIAZEPAM 5 MG PO TABS
ORAL_TABLET | ORAL | Status: AC
  Filled 2022-06-27: qty 1

## 2022-06-27 MED ORDER — ROPIVACAINE HCL 2 MG/ML IJ SOLN
INTRAMUSCULAR | Status: AC
  Filled 2022-06-27: qty 20

## 2022-06-27 MED ORDER — DIAZEPAM 5 MG PO TABS
5.0000 mg | ORAL_TABLET | ORAL | Status: AC
  Administered 2022-06-27: 5 mg via ORAL

## 2022-06-27 MED ORDER — IOHEXOL 180 MG/ML  SOLN
10.0000 mL | Freq: Once | INTRAMUSCULAR | Status: AC
Start: 2022-06-27 — End: 2022-06-27
  Administered 2022-06-27: 10 mL via EPIDURAL

## 2022-06-27 MED ORDER — DEXAMETHASONE SODIUM PHOSPHATE 10 MG/ML IJ SOLN
10.0000 mg | Freq: Once | INTRAMUSCULAR | Status: AC
  Administered 2022-06-27: 10 mg

## 2022-06-27 MED ORDER — SODIUM CHLORIDE 0.9% FLUSH
2.0000 mL | Freq: Once | INTRAVENOUS | Status: AC
  Administered 2022-06-27: 2 mL

## 2022-06-27 MED ORDER — LIDOCAINE HCL (PF) 2 % IJ SOLN
INTRAMUSCULAR | Status: AC
  Filled 2022-06-27: qty 10

## 2022-06-27 MED ORDER — IOHEXOL 180 MG/ML  SOLN
INTRAMUSCULAR | Status: AC
  Filled 2022-06-27: qty 20

## 2022-06-27 NOTE — Progress Notes (Signed)
Safety precautions to be maintained throughout the outpatient stay will include: orient to surroundings, keep bed in low position, maintain call bell within reach at all times, provide assistance with transfer out of bed and ambulation.  

## 2022-06-27 NOTE — Progress Notes (Signed)
PROVIDER NOTE: Interpretation of information contained herein should be left to medically-trained personnel. Specific patient instructions are provided elsewhere under "Patient Instructions" section of medical record. This document was created in part using STT-dictation technology, any transcriptional errors that may result from this process are unintentional.  Patient: Casey [name] Type: Established DOB: 20-Nov-1946 MRN: [id number] PCP: Pleas Koch, NP  Service: Procedure DOS: 06/27/2022 Setting: Ambulatory Location: Ambulatory outpatient facility Delivery: Face-to-face Provider: Gillis Santa, MD Specialty: Interventional Pain Management Specialty designation: 09 Location: Outpatient facility Ref. Prov.: Pleas Koch, NP    Primary Reason for Visit: Interventional Pain Management Treatment. CC: Back Pain   Procedure:           Type: Lumbar epidural steroid injection (LESI) (interlaminar) #1    Laterality: Left   Level:  L1-2 Level (bevel pointed down) Imaging: Fluoroscopic guidance         Anesthesia: Local anesthesia (1-2% Lidocaine) Anxiolysis: Oral Valium 5 mg DOS: 06/27/2022  Performed by: Gillis Santa, MD  Purpose: Diagnostic/Therapeutic Indications: Lumbar radicular pain of intraspinal etiology of more than 4 weeks that has failed to respond to conservative therapy and is severe enough to impact quality of life or function. 1. Chronic radicular lumbar pain   2. Spinal stenosis, lumbar region, with neurogenic claudication   3. Lumbar radiculopathy    NAS-11 Pain score:   Pre-procedure: 10-Worst pain ever/10   Post-procedure: 0-No pain/10      Position / Prep / Materials:  Position: Prone w/ head of the table raised (slight reverse trendelenburg) to facilitate breathing.  Prep solution: DuraPrep (Iodine Povacrylex [0.7% available iodine] and Isopropyl Alcohol, 74% w/w) Prep Area: Entire Posterior Lumbar Region from lower scapular tip down to mid buttocks  area and from flank to flank. Materials:  Tray: Epidural tray Needle(s):  Type: Epidural needle (Tuohy) Gauge (G):  22 Length: Regular (3.5-in) Qty: 1  Pre-op H&P Assessment:  Casey [name] is a 76 y.o. (year old), female patient, seen today for interventional treatment. She  has a past surgical history that includes Epidural block injection; Neck surgery; great toe (Left); Rotator cuff repair (Right); Anterior lateral lumbar fusion with percutaneous screw 1 level (N/A, 06/17/2020); and Lumbar laminectomy/decompression microdiscectomy (Right, 06/17/2020). Casey [name] has a current medication list which includes the following prescription(s): acetaminophen, adalimumab, vitamin c, cholecalciferol, elderberry, etodolac, gabapentin, lidocaine, melatonin, methocarbamol, multivitamin with minerals, polyethylene glycol, pravastatin, duloxetine, and prednisone. Her primarily concern today is the Back Pain  Initial Vital Signs:  Pulse/HCG Rate: 83ECG Heart Rate: 78 Temp:  (!) 97.3 F (36.3 C) Resp: 15 BP:  (!) 188/90 SpO2: 100 %  BMI: Estimated body mass index is 25.15 kg/m as calculated from the following:   Height as of this encounter: 5\' 3"  (1.6 m).   Weight as of this encounter: 142 lb (64.4 kg).  Risk Assessment: Allergies: Reviewed. She is allergic to leflunomide and methotrexate.  Allergy Precautions: None required Coagulopathies: Reviewed. None identified.  Blood-thinner therapy: None at this time Active Infection(s): Reviewed. None identified. [name] is afebrile  Site Confirmation: Ms. Takayama was asked to confirm the procedure and laterality before marking the site Procedure checklist: Completed Consent: Before the procedure and under the influence of no sedative(s), amnesic(s), or anxiolytics, the patient was informed of the treatment options, risks and possible complications. To fulfill our ethical and legal obligations, as recommended by the American Medical Association's Code of  Ethics, I have informed the patient of my clinical impression; the nature and purpose of  the treatment or procedure; the risks, benefits, and possible complications of the intervention; the alternatives, including doing nothing; the risk(s) and benefit(s) of the alternative treatment(s) or procedure(s); and the risk(s) and benefit(s) of doing nothing. The patient was provided information about the general risks and possible complications associated with the procedure. These may include, but are not limited to: failure to achieve desired goals, infection, bleeding, organ or nerve damage, allergic reactions, paralysis, and death. In addition, the patient was informed of those risks and complications associated to Spine-related procedures, such as failure to decrease pain; infection (i.e.: Meningitis, epidural or intraspinal abscess); bleeding (i.e.: epidural hematoma, subarachnoid hemorrhage, or any other type of intraspinal or peri-dural bleeding); organ or nerve damage (i.e.: Any type of peripheral nerve, nerve root, or spinal cord injury) with subsequent damage to sensory, motor, and/or autonomic systems, resulting in permanent pain, numbness, and/or weakness of one or several areas of the body; allergic reactions; (i.e.: anaphylactic reaction); and/or death. Furthermore, the patient was informed of those risks and complications associated with the medications. These include, but are not limited to: allergic reactions (i.e.: anaphylactic or anaphylactoid reaction(s)); adrenal axis suppression; blood sugar elevation that in diabetics may result in ketoacidosis or comma; water retention that in patients with history of congestive heart failure may result in shortness of breath, pulmonary edema, and decompensation with resultant heart failure; weight gain; swelling or edema; medication-induced neural toxicity; particulate matter embolism and blood vessel occlusion with resultant organ, and/or nervous system  infarction; and/or aseptic necrosis of one or more joints. Finally, the patient was informed that Medicine is not an exact science; therefore, there is also the possibility of unforeseen or unpredictable risks and/or possible complications that may result in a catastrophic outcome. The patient indicated having understood very clearly. We have given the patient no guarantees and we have made no promises. Enough time was given to the patient to ask questions, all of which were answered to the patient's satisfaction. Ms. Kolbo has indicated that she wanted to continue with the procedure. Attestation: I, the ordering provider, attest that I have discussed with the patient the benefits, risks, side-effects, alternatives, likelihood of achieving goals, and potential problems during recovery for the procedure that I have provided informed consent. Date  Time: 06/27/2022  9:25 AM  Pre-Procedure Preparation:  Monitoring: As per clinic protocol. Respiration, ETCO2, SpO2, BP, heart rate and rhythm monitor placed and checked for adequate function Safety Precautions: Patient was assessed for positional comfort and pressure points before starting the procedure. Time-out: I initiated and conducted the "Time-out" before starting the procedure, as per protocol. The patient was asked to participate by confirming the accuracy of the "Time Out" information. Verification of the correct person, site, and procedure were performed and confirmed by me, the nursing staff, and the patient. "Time-out" conducted as per Joint Commission's Universal Protocol (UP.01.01.01). Time: 0950  Description/Narrative of Procedure:          Target: Epidural space via interlaminar opening, initially targeting the lower laminar border of the superior vertebral body. Region: Lumbar Approach: Percutaneous paravertebral  Rationale (medical necessity): procedure needed and proper for the diagnosis and/or treatment of the patient's medical symptoms  and needs. Procedural Technique Safety Precautions: Aspiration looking for blood return was conducted prior to all injections. At no point did we inject any substances, as a needle was being advanced. No attempts were made at seeking any paresthesias. Safe injection practices and needle disposal techniques used. Medications properly checked for expiration dates. SDV (single  dose vial) medications used. Description of the Procedure: Protocol guidelines were followed. The procedure needle was introduced through the skin, ipsilateral to the reported pain, and advanced to the target area. Bone was contacted and the needle walked caudad, until the lamina was cleared. The epidural space was identified using "loss-of-resistance technique" with 2-3 ml of PF-NaCl (0.9% NSS), in a 5cc LOR glass syringe.  5cc solution made of 2 cc of preservative-free saline, 2 cc of 0.2% ropivacaine, 1 cc of Decadron 10 mg/cc.   Vitals:   06/27/22 0929 06/27/22 0949 06/27/22 0954 06/27/22 0956  BP: (!) 188/90 (!) 172/97 (!) 166/95 (!) 164/98  Pulse: 83     Resp:  15 12 18   Temp: (!) 97.3 F (36.3 C)     TempSrc: Temporal     SpO2: 100% 97% 96% 98%  Weight: 142 lb (64.4 kg)     Height: 5\' 3"  (1.6 m)       Start Time: 0950 hrs. End Time: 0955 hrs.  Imaging Guidance (Spinal):          Type of Imaging Technique: Fluoroscopy Guidance (Spinal) Indication(s): Assistance in needle guidance and placement for procedures requiring needle placement in or near specific anatomical locations not easily accessible without such assistance. Exposure Time: Please see nurses notes. Contrast: Before injecting any contrast, we confirmed that the patient did not have an allergy to iodine, shellfish, or radiological contrast. Once satisfactory needle placement was completed at the desired level, radiological contrast was injected. Contrast injected under live fluoroscopy. No contrast complications. See chart for type and volume of contrast  used. Fluoroscopic Guidance: I was personally present during the use of fluoroscopy. "Tunnel Vision Technique" used to obtain the best possible view of the target area. Parallax error corrected before commencing the procedure. "Direction-depth-direction" technique used to introduce the needle under continuous pulsed fluoroscopy. Once target was reached, antero-posterior, oblique, and lateral fluoroscopic projection used confirm needle placement in all planes. Images permanently stored in EMR. Interpretation: I personally interpreted the imaging intraoperatively. Adequate needle placement confirmed in multiple planes. Appropriate spread of contrast into desired area was observed. No evidence of afferent or efferent intravascular uptake. No intrathecal or subarachnoid spread observed. Permanent images saved into the patient's record.  Antibiotic Prophylaxis:   Anti-infectives (From admission, onward)    None      Indication(s): None identified  Post-operative Assessment:  Post-procedure Vital Signs:  Pulse/HCG Rate: 8378 Temp:  (!) 97.3 F (36.3 C) Resp: 18 BP:  (!) 164/98 SpO2: 98 %  EBL: None  Complications: No immediate post-treatment complications observed by team, or reported by patient.  Note: The patient tolerated the entire procedure well. A repeat set of vitals were taken after the procedure and the patient was kept under observation following institutional policy, for this type of procedure. Post-procedural neurological assessment was performed, showing return to baseline, prior to discharge. The patient was provided with post-procedure discharge instructions, including a section on how to identify potential problems. Should any problems arise concerning this procedure, the patient was given instructions to immediately contact , at any time, without hesitation. In any case, we plan to contact the patient by telephone for a follow-up status report regarding this interventional  procedure.  Comments:  No additional relevant information.  5 out of 5 strength bilateral lower extremity: Plantar flexion, dorsiflexion, knee flexion, knee extension.   Plan of Care  Orders:  Orders Placed This Encounter  Procedures   DG PAIN CLINIC C-ARM 1-60 MIN NO REPORT  Intraoperative interpretation by procedural physician at [facility].    Standing Status:   Standing    Number of Occurrences:   1    Order Specific Question:   Reason for exam:    Answer:   Assistance in needle guidance and placement for procedures requiring needle placement in or near specific anatomical locations not easily accessible without such assistance.    Medications ordered for procedure: Meds ordered this encounter  Medications   iohexol (OMNIPAQUE) 180 MG/ML injection 10 mL    Must be Myelogram-compatible. If not available, you may substitute with a water-soluble, non-ionic, hypoallergenic, myelogram-compatible radiological contrast medium.   lidocaine (XYLOCAINE) 2 % (with pres) injection 400 mg   diazepam (VALIUM) tablet 5 mg    Make sure Flumazenil is available in the pyxis when using this medication. If oversedation occurs, administer 0.2 mg IV over 15 sec. If after 45 sec no response, administer 0.2 mg again over 1 min; may repeat at 1 min intervals; not to exceed 4 doses (1 mg)   sodium chloride flush (NS) 0.9 % injection 2 mL   ropivacaine (PF) 2 mg/mL (0.2%) (NAROPIN) injection 2 mL   dexamethasone (DECADRON) injection 10 mg   Medications administered: We administered iohexol, lidocaine, diazepam, sodium chloride flush, ropivacaine (PF) 2 mg/mL (0.2%), and dexamethasone.  See the medical record for exact dosing, route, and time of administration.  Follow-up plan:   Return in about 6 weeks (around 08/08/2022) for Post Procedure Evaluation, virtual.       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   Recent  Visits Date Type Provider Dept  04/07/22 Office Visit Edward Jolly, MD [facility]  Showing recent visits within past 90 days and meeting all other requirements Today's Visits Date Type Provider Dept  06/27/22 Procedure visit Edward Jolly, MD [facility]  Showing today's visits and meeting all other requirements Future Appointments Date Type Provider Dept  08/08/22 Appointment Edward Jolly, MD [facility]  Showing future appointments within next 90 days and meeting all other requirements  Disposition: Discharge home  Discharge (Date  Time): 06/27/2022; 1010 hrs.   Primary Care Physician: Doreene Nest, NP Location: [facility] Note by: Edward Jolly, MD Date: 06/27/2022; Time: 10:01 AM  Disclaimer:  Medicine is not an exact science. The only guarantee in medicine is that nothing is guaranteed. It is important to note that the decision to proceed with this intervention was based on the information collected from the patient. The Data and conclusions were drawn from the patient's questionnaire, the interview, and the physical examination. Because the information was provided in large part by the patient, it cannot be guaranteed that it has not been purposely or unconsciously manipulated. Every effort has been made to obtain as much relevant data as possible for this evaluation. It is important to note that the conclusions that lead to this procedure are derived in large part from the available data. Always take into account that the treatment will also be dependent on availability of resources and existing treatment guidelines, considered by other Pain Management Practitioners as being common knowledge and practice, at the time of the intervention. For Medico-Legal purposes, it is also important to point out that variation in procedural techniques and pharmacological choices are the acceptable norm. The indications,  contraindications, technique, and results of the above procedure should only be interpreted and judged by a Board-Certified  Interventional Pain Specialist with extensive familiarity and expertise in the same exact procedure and technique.

## 2022-06-27 NOTE — Patient Instructions (Signed)
Pain Management Discharge Instructions  General Discharge Instructions :  If you need to reach your doctor call: Monday-Friday 8:00 am - 4:00 pm at [phone] or toll free [phone].  After clinic hours [phone] to have operator reach doctor.  Bring all of your medication bottles to all your appointments in the pain clinic.  To cancel or reschedule your appointment with Pain Management please remember to call 24 hours in advance to avoid a fee.  Refer to the educational materials which you have been given on: General Risks, I had my Procedure. Discharge Instructions, Post Sedation.  Post Procedure Instructions:  The drugs you were given will stay in your system until tomorrow, so for the next 24 hours you should not drive, make any legal decisions or drink any alcoholic beverages.  You may eat anything you prefer, but it is better to start with liquids then soups and crackers, and gradually work up to solid foods.  Please notify your doctor immediately if you have any unusual bleeding, trouble breathing or pain that is not related to your normal pain.  Depending on the type of procedure that was done, some parts of your body may feel week and/or numb.  This usually clears up by tonight or the next day.  Walk with the use of an assistive device or accompanied by an adult for the 24 hours.  You may use ice on the affected area for the first 24 hours.  Put ice in a Ziploc bag and cover with a towel and place against area 15 minutes on 15 minutes off.  You may switch to heat after 24 hours.Epidural Steroid Injection Patient Information  Description: The epidural space surrounds the nerves as they exit the spinal cord.  In some patients, the nerves can be compressed and inflamed by a bulging disc or a tight spinal canal (spinal stenosis).  By injecting steroids into the epidural space, we can bring irritated nerves into direct contact with a potentially helpful medication.  These  steroids act directly on the irritated nerves and can reduce swelling and inflammation which often leads to decreased pain.  Epidural steroids may be injected anywhere along the spine and from the neck to the low back depending upon the location of your pain.   After numbing the skin with local anesthetic (like Novocaine), a small needle is passed into the epidural space slowly.  You may experience a sensation of pressure while this is being done.  The entire block usually last less than 10 minutes.  Conditions which may be treated by epidural steroids:  Low back and leg pain Neck and arm pain Spinal stenosis Post-laminectomy syndrome Herpes zoster (shingles) pain Pain from compression fractures  Preparation for the injection:  Do not eat any solid food or dairy products within 8 hours of your appointment.  You may drink clear liquids up to 3 hours before appointment.  Clear liquids include water, black coffee, juice or soda.  No milk or cream please. You may take your regular medication, including pain medications, with a sip of water before your appointment  Diabetics should hold regular insulin (if taken separately) and take 1/2 normal NPH dos the morning of the procedure.  Carry some sugar containing items with you to your appointment. A driver must accompany you and be prepared to drive you home after your procedure.  Bring all your current medications with your. An IV may be inserted and sedation may be given at the discretion of the physician.     A blood pressure cuff, EKG and other monitors will often be applied during the procedure.  Some patients may need to have extra oxygen administered for a short period. You will be asked to provide medical information, including your allergies, prior to the procedure.  We must know immediately if you are taking blood thinners (like Coumadin/Warfarin)  Or if you are allergic to IV iodine contrast (dye). We must know if you could possible be  pregnant.  Possible side-effects: Bleeding from needle site Infection (rare, may require surgery) Nerve injury (rare) Numbness & tingling (temporary) Difficulty urinating (rare, temporary) Spinal headache ( a headache worse with upright posture) Light -headedness (temporary) Pain at injection site (several days) Decreased blood pressure (temporary) Weakness in arm/leg (temporary) Pressure sensation in back/neck (temporary)  Call if you experience: Fever/chills associated with headache or increased back/neck pain. Headache worsened by an upright position. New onset weakness or numbness of an extremity below the injection site Hives or difficulty breathing (go to the emergency room) Inflammation or drainage at the infection site Severe back/neck pain Any new symptoms which are concerning to you  Please note:  Although the local anesthetic injected can often make your back or neck feel good for several hours after the injection, the pain will likely return.  It takes 3-7 days for steroids to work in the epidural space.  You may not notice any pain relief for at least that one week.  If effective, we will often do a series of three injections spaced 3-6 weeks apart to maximally decrease your pain.  After the initial series, we generally will wait several months before considering a repeat injection of the same type.  If you have any questions, please call [phone] [facility] 

## 2022-06-28 ENCOUNTER — Telehealth: Payer: Self-pay

## 2022-06-28 NOTE — Telephone Encounter (Signed)
Post procedure phone call.  Patient states she is doing ok.  

## 2022-07-14 ENCOUNTER — Telehealth: Payer: Self-pay | Admitting: Student in an Organized Health Care Education/Training Program

## 2022-07-14 NOTE — Telephone Encounter (Signed)
Patient had lesi 06-27-22. She says pain has come back and she would like to talk to someone about this. Her follow up is 08-08-22

## 2022-07-14 NOTE — Telephone Encounter (Signed)
Are there any sooner appts? 

## 2022-07-18 ENCOUNTER — Encounter: Payer: Self-pay | Admitting: Student in an Organized Health Care Education/Training Program

## 2022-07-19 ENCOUNTER — Ambulatory Visit
Payer: Medicare HMO | Attending: Student in an Organized Health Care Education/Training Program | Admitting: Student in an Organized Health Care Education/Training Program

## 2022-07-19 DIAGNOSIS — M47816 Spondylosis without myelopathy or radiculopathy, lumbar region: Secondary | ICD-10-CM

## 2022-07-19 DIAGNOSIS — Z981 Arthrodesis status: Secondary | ICD-10-CM

## 2022-07-19 DIAGNOSIS — G894 Chronic pain syndrome: Secondary | ICD-10-CM | POA: Diagnosis not present

## 2022-07-19 NOTE — Progress Notes (Signed)
Patient: Casey [name]  Service Category: E/M  Provider: Gillis Santa, MD  DOB: [birth year]  DOS: 07/19/2022  Location: Office  MRN: [id number]  Setting: Ambulatory outpatient  Referring Provider: Pleas Koch, NP  Type: Established Patient  Specialty: Interventional Pain Management  PCP: Pleas Koch, NP  Location: Remote location  Delivery: TeleHealth     Virtual Encounter - Pain Management PROVIDER NOTE: Information contained herein reflects review and annotations entered in association with encounter. Interpretation of such information and data should be left to medically-trained personnel. Information provided to patient can be located elsewhere in the medical record under "Patient Instructions". Document created using STT-dictation technology, any transcriptional errors that may result from process are unintentional.    Contact & Pharmacy Preferred: [phone] Home: [phone] (home) Mobile: [phone] (mobile) E-mail: [email or web address]  [id number] [address]: [phone]: [id number] [place]: [phone]: [phone]  Pre-screening  Ms. Luczak offered "in-person" vs "virtual" encounter. She indicated preferring virtual for this encounter.   Reason COVID-19*  Social distancing based on CDC and AMA recommendations.   I contacted CChristoper Fabianon 07/19/2022 via telephone.      I clearly identified myself as BGillis Santa MD. I verified that I was speaking with the correct person using two identifiers (Name: CMARYCARMEN HAGEY and date of birth: 78-May-1947.  Consent I sought verbal advanced consent from CChristoper Fabianfor virtual visit interactions. I informed Ms. Kuc of possible security and privacy concerns, risks, and limitations  associated with providing "not-in-person" medical evaluation and management services. I also informed Ms. Hodgdon of the availability of "in-person" appointments. Finally, I informed her that there would be a charge for the virtual visit and that she could be  personally, fully or partially, financially responsible for it. Ms. ECoradoexpressed understanding and agreed to proceed.   Historic Elements   Ms. CSHARNESE HEATHis a 76y.o. year old, female patient evaluated today after our last contact on 07/14/2022. [name] has a past medical history of Allergy, Chronic back pain, Herpes zoster without complication (133/00/7622, History of kidney stones, Osteoporosis, and Rheumatoid arthritis ([place]. She also  has a past surgical history that includes Epidural block injection; Neck surgery; great toe (Left); Rotator cuff repair (Right); Anterior lateral lumbar fusion with percutaneous screw 1 level (N/A, 06/17/2020); and Lumbar laminectomy/decompression microdiscectomy (Right, 06/17/2020). Ms. EKleenhas a current medication list which includes the following prescription(s): acetaminophen, adalimumab, vitamin c, cholecalciferol, elderberry, etodolac, gabapentin, lidocaine, melatonin, methocarbamol, multivitamin with minerals, polyethylene glycol, pravastatin, and prednisone. She  reports that she has never smoked. She has never used smokeless tobacco. She reports that she does not drink alcohol and does not use drugs. Ms. EMinottiis allergic to leflunomide and methotrexate.   HPI  Today, she is being contacted for a post-procedure assessment.   Post-procedure evaluation   Type: Lumbar epidural steroid injection (LESI) (interlaminar) #1    Laterality: Left   Level:  L1-2 Level (bevel pointed down) Imaging: Fluoroscopic guidance         Anesthesia: Local anesthesia (1-2% Lidocaine) Anxiolysis: Oral Valium 5 mg DOS: 06/27/2022  Performed by: BGillis Santa MD  Purpose: Diagnostic/Therapeutic Indications: Lumbar  radicular pain of intraspinal etiology of more than 4 weeks that has failed  to respond to conservative therapy and is severe enough to impact quality of life or function. 1. Chronic radicular lumbar pain   2. Spinal stenosis, lumbar region, with neurogenic claudication   3. Lumbar radiculopathy    NAS-11 Pain score:   Pre-procedure: 10-Worst pain ever/10   Post-procedure: 0-No pain/10     Effectiveness:  Initial hour after procedure: 100 %  Subsequent 4-6 hours post-procedure: 75 %  Analgesia past initial 6 hours: 75% Ongoing improvement:  Analgesic:  <10% Function: Back to baseline ROM: Back to baseline   Laboratory Chemistry Profile   Renal Lab Results  Component Value Date   BUN 10 12/14/2021   CREATININE 0.79 38/32/9191   BCR NOT APPLICABLE 66/04/44   GFR 72.17 12/14/2021   GFRAA >60 06/11/2020   GFRNONAA >60 06/11/2020    Hepatic Lab Results  Component Value Date   AST 17 03/10/2022   ALT 7 03/10/2022   ALBUMIN 3.9 03/10/2022   ALKPHOS 57 03/10/2022    Electrolytes Lab Results  Component Value Date   NA 140 12/14/2021   K 4.5 12/14/2021   CL 105 12/14/2021   CALCIUM 9.4 12/14/2021    Bone No results found for: "VD25OH", "VD125OH2TOT", "TX7741SE3", "TR3202BX4", "25OHVITD1", "25OHVITD2", "25OHVITD3", "TESTOFREE", "TESTOSTERONE"  Inflammation (CRP: Acute Phase) (ESR: Chronic Phase) No results found for: "CRP", "ESRSEDRATE", "LATICACIDVEN"       Note: Above Lab results reviewed.   CLINICAL DATA:  Low back pain with symptoms persisting over 6 weeks. Lumbar radiculopathy with pain extending down both legs for years   EXAM: MRI LUMBAR SPINE WITHOUT CONTRAST   TECHNIQUE: Multiplanar, multisequence MR imaging of the lumbar spine was performed. No intravenous contrast was administered.   COMPARISON:  08/17/2020   FINDINGS: Segmentation:  5 lumbar type vertebrae as previously established   Alignment: Levoscoliosis and fused L4-5 anterolisthesis.  Stable mild L5-S1 anterolisthesis   Vertebrae:  No fracture, evidence of discitis, or bone lesion.   Conus medullaris and cauda equina: Conus extends to the L1 level. Conus and cauda equina appear normal.   Paraspinal and other soft tissues: Negative for perispinal mass or inflammation   Disc levels:   T12- L1: Degenerative facet spurring asymmetric to the left. Mild left foraminal narrowing   L1-L2: Degenerative facet spurring on the left more than right   L2-L3: Disc collapse with disc bulging and right paracentral to foraminal protrusion. Asymmetric right facet spurring. Right foraminal impingement. Right subarticular recess narrowing that is noncompressive after right laminotomy.   L3-L4: Disc narrowing and bulging with mild facet spurring   L4-L5: PLIF. Bilateral subarticular recess stenosis primarily from anterolisthesis and facet hypertrophy, impinging on both descending L5 nerve roots. Patent foramina   L5-S1:Facet osteoarthritis with mild anterolisthesis. Mild disc bulging. No neural impingement.   IMPRESSION: 1. Generalized lumbar spine degeneration with scoliosis and L4-5, L5-S1 anterolisthesis. No change since 2020. 2. L2-3 right foraminal impingement. 3. L4-5 bilateral subarticular recess stenosis and L5 impingement.     Electronically Signed   By: Jorje Guild M.D.   On: 03/29/2022 06:09  Assessment  The primary encounter diagnosis was Lumbar facet arthropathy. Diagnoses of History of lumbar fusion and Chronic pain syndrome were also pertinent to this visit.  Plan of Care  Unfortunately, no significant therapeutic response after lumbar epidural steroid injection.  Discussed treatment plan as the patient continues to have fairly significant pain and limited functional status as a result of her pain disability.  She does have facet osteoarthritis at L5-S1 which is  below her L4-L5 fusion hardware.  We discussed diagnostic lumbar facet medial branch nerve  blocks as the majority of her pain is in her lower lumbar spine with radiation to her buttock which could certainly be from the L5-S1 facet joint.  We also discussed spinal cord stimulator trial.  Patient states that she would like to try what ever is least invasive initially.  Casey [name] has a history of greater than 3 months of moderate to severe pain which is resulted in functional impairment.  The patient has tried various conservative therapeutic options such as NSAIDs, Tylenol, muscle relaxants, physical therapy which was inadequately effective.  Patient's pain is predominantly axial with right buttock pain with physical exam & L-MRI findings suggestive of facet arthropathy.  Lumbar facet medial branch nerve blocks were discussed with the patient.  Risks and benefits were reviewed.  Patient would like to proceed with bilateral L3, L4, L5 medial branch nerve block.  Future considerations include lumbar spinal cord stimulator trial.    Orders:  Orders Placed This Encounter  Procedures   LUMBAR FACET(MEDIAL BRANCH NERVE BLOCK) MBNB    Standing Status:   Future    Standing Expiration Date:   10/19/2022    Scheduling Instructions:     Procedure: Lumbar facet block (AKA.: Lumbosacral medial branch nerve block)     Side: Bilateral     Level: L3-4 & L5-S1 Facets (L3, L4, L5, Medial Branch Nerves)     Sedation: 5 mg PO      Timeframe: ASAA    Order Specific Question:   Where will this procedure be performed?    Answer:   [facility]   Follow-up plan:   Return in about 2 weeks (around 08/02/2022).     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    Recent Visits Date Type Provider Dept  06/27/22 Procedure visit Gillis Santa, MD [facility]  Showing recent visits within past 90 days and meeting all other requirements Today's Visits Date Type Provider Dept  07/19/22 Office Visit Gillis Santa, MD  [facility]  Showing today's visits and meeting all other requirements Future Appointments No visits were found meeting these conditions. Showing future appointments within next 90 days and meeting all other requirements  I discussed the assessment and treatment plan with the patient. The patient was provided an opportunity to ask questions and all were answered. The patient agreed with the plan and demonstrated an understanding of the instructions.  Patient advised to call back or seek an in-person evaluation if the symptoms or condition worsens.  Duration of encounter: 15mnutes.  Note by: BGillis Santa MD Date: 07/19/2022; Time: 10:59 AM

## 2022-08-08 ENCOUNTER — Ambulatory Visit
Payer: Medicare HMO | Attending: Student in an Organized Health Care Education/Training Program | Admitting: Student in an Organized Health Care Education/Training Program

## 2022-08-08 ENCOUNTER — Ambulatory Visit
Admission: RE | Admit: 2022-08-08 | Discharge: 2022-08-08 | Disposition: A | Discharge status: Home / Self Care | Payer: Medicare HMO | Source: Ambulatory Visit | Attending: Student in an Organized Health Care Education/Training Program | Admitting: Student in an Organized Health Care Education/Training Program

## 2022-08-08 ENCOUNTER — Encounter: Payer: Self-pay | Admitting: Student in an Organized Health Care Education/Training Program

## 2022-08-08 ENCOUNTER — Telehealth: Payer: Medicare HMO | Admitting: Student in an Organized Health Care Education/Training Program

## 2022-08-08 DIAGNOSIS — M47816 Spondylosis without myelopathy or radiculopathy, lumbar region: Secondary | ICD-10-CM | POA: Diagnosis present

## 2022-08-08 DIAGNOSIS — G894 Chronic pain syndrome: Secondary | ICD-10-CM | POA: Insufficient documentation

## 2022-08-08 DIAGNOSIS — Z981 Arthrodesis status: Secondary | ICD-10-CM | POA: Diagnosis present

## 2022-08-08 MED ORDER — ROPIVACAINE HCL 2 MG/ML IJ SOLN
9.0000 mL | Freq: Once | INTRAMUSCULAR | Status: AC
  Administered 2022-08-08: 9 mL via PERINEURAL

## 2022-08-08 MED ORDER — DEXAMETHASONE SODIUM PHOSPHATE 10 MG/ML IJ SOLN
INTRAMUSCULAR | Status: AC
  Filled 2022-08-08: qty 2

## 2022-08-08 MED ORDER — LIDOCAINE HCL 2 % IJ SOLN
20.0000 mL | Freq: Once | INTRAMUSCULAR | Status: AC
  Administered 2022-08-08: 400 mg

## 2022-08-08 MED ORDER — DIAZEPAM 5 MG PO TABS
5.0000 mg | ORAL_TABLET | ORAL | Status: AC
  Administered 2022-08-08: 5 mg via ORAL

## 2022-08-08 MED ORDER — DEXAMETHASONE SODIUM PHOSPHATE 10 MG/ML IJ SOLN
10.0000 mg | Freq: Once | INTRAMUSCULAR | Status: AC
  Administered 2022-08-08: 10 mg

## 2022-08-08 MED ORDER — LIDOCAINE HCL 2 % IJ SOLN
INTRAMUSCULAR | Status: AC
  Filled 2022-08-08: qty 20

## 2022-08-08 MED ORDER — DIAZEPAM 5 MG PO TABS
ORAL_TABLET | ORAL | Status: AC
  Filled 2022-08-08: qty 1

## 2022-08-08 MED ORDER — ROPIVACAINE HCL 2 MG/ML IJ SOLN
INTRAMUSCULAR | Status: AC
  Filled 2022-08-08: qty 20

## 2022-08-08 NOTE — Progress Notes (Signed)
Safety precautions to be maintained throughout the outpatient stay will include: orient to surroundings, keep bed in low position, maintain call bell within reach at all times, provide assistance with transfer out of bed and ambulation.  

## 2022-08-08 NOTE — Progress Notes (Signed)
PROVIDER NOTE: Interpretation of information contained herein should be left to medically-trained personnel. Specific patient instructions are provided elsewhere under "Patient Instructions" section of medical record. This document was created in part using STT-dictation technology, any transcriptional errors that may result from this process are unintentional.  Patient: Casey [name] Type: Established DOB: 1946-02-21 MRN: [id number] PCP: Doreene Nest, NP  Service: Procedure DOS: 08/08/2022 Setting: Ambulatory Location: Ambulatory outpatient facility Delivery: Face-to-face Provider: Edward Jolly, MD Specialty: Interventional Pain Management Specialty designation: 09 Location: Outpatient facility Ref. Prov.: Edward Jolly, MD    Procedure:           Type: Lumbar Facet, Medial Branch Block(s) #1  Laterality: Bilateral  Level: L3, L4, L5,  Medial Branch Level(s). Injecting these levels blocks the L3-4 and L4-5 lumbar facet joints. Imaging: Fluoroscopic guidance         Anesthesia: Local anesthesia (1-2% Lidocaine) Anxiolysis: Oral Valium 5 mg Sedation: Minimal Sedation                       DOS: 08/08/2022 Performed by: Edward Jolly, MD  Primary Purpose: Diagnostic/Therapeutic Indications: Low back pain severe enough to impact quality of life or function. 1. Lumbar facet arthropathy   2. History of lumbar fusion   3. Chronic pain syndrome    NAS-11 Pain score:   Pre-procedure: 10-Worst pain ever/10   Post-procedure: 8 /10     Position / Prep / Materials:  Position: Prone  Prep solution: DuraPrep (Iodine Povacrylex [0.7% available iodine] and Isopropyl Alcohol, 74% w/w) Area Prepped: Posterolateral Lumbosacral Spine (Wide prep: From the lower border of the scapula down to the end of the tailbone and from flank to flank.)  Materials:  Tray: Block Needle(s):  Type: Spinal  Gauge (G): 22  Length: 5-in Qty: 2     Pre-op H&P Assessment:  Casey [name] is a 76 y.o. (year  old), female patient, seen today for interventional treatment. She  has a past surgical history that includes Epidural block injection; Neck surgery; great toe (Left); Rotator cuff repair (Right); Anterior lateral lumbar fusion with percutaneous screw 1 level (N/A, 06/17/2020); and Lumbar laminectomy/decompression microdiscectomy (Right, 06/17/2020). Casey [name] has a current medication list which includes the following prescription(s): acetaminophen, adalimumab, vitamin c, cholecalciferol, elderberry, etodolac, gabapentin, lidocaine, melatonin, methocarbamol, multivitamin with minerals, polyethylene glycol, pravastatin, and prednisone. Her primarily concern today is the Back Pain (lower)  Initial Vital Signs:  Pulse/HCG Rate: 89ECG Heart Rate: 78 Temp:  (!) 97.3 F (36.3 C) Resp: 16 BP:  (!) 148/86 SpO2: 99 %  BMI: Estimated body mass index is 25.69 kg/m as calculated from the following:   Height as of this encounter:  (1.6 m).   Weight as of this encounter: 145 lb (65.8 kg).  Risk Assessment: Allergies: Reviewed. She is allergic to leflunomide and methotrexate.  Allergy Precautions: None required Coagulopathies: Reviewed. None identified.  Blood-thinner therapy: None at this time Active Infection(s): Reviewed. None identified. Casey [name] is afebrile  Site Confirmation: Casey [name] was asked to confirm the procedure and laterality before marking the site Procedure checklist: Completed Consent: Before the procedure and under the influence of no sedative(s), amnesic(s), or anxiolytics, the patient was informed of the treatment options, risks and possible complications. To fulfill our ethical and legal obligations, as recommended by the American Medical Association's Code of Ethics, I have informed the patient of my clinical impression; the nature and purpose of the treatment or procedure; the risks, benefits, and possible  complications of the intervention; the alternatives, including doing  nothing; the risk(s) and benefit(s) of the alternative treatment(s) or procedure(s); and the risk(s) and benefit(s) of doing nothing. The patient was provided information about the general risks and possible complications associated with the procedure. These may include, but are not limited to: failure to achieve desired goals, infection, bleeding, organ or nerve damage, allergic reactions, paralysis, and death. In addition, the patient was informed of those risks and complications associated to Spine-related procedures, such as failure to decrease pain; infection (i.e.: Meningitis, epidural or intraspinal abscess); bleeding (i.e.: epidural hematoma, subarachnoid hemorrhage, or any other type of intraspinal or peri-dural bleeding); organ or nerve damage (i.e.: Any type of peripheral nerve, nerve root, or spinal cord injury) with subsequent damage to sensory, motor, and/or autonomic systems, resulting in permanent pain, numbness, and/or weakness of one or several areas of the body; allergic reactions; (i.e.: anaphylactic reaction); and/or death. Furthermore, the patient was informed of those risks and complications associated with the medications. These include, but are not limited to: allergic reactions (i.e.: anaphylactic or anaphylactoid reaction(s)); adrenal axis suppression; blood sugar elevation that in diabetics may result in ketoacidosis or comma; water retention that in patients with history of congestive heart failure may result in shortness of breath, pulmonary edema, and decompensation with resultant heart failure; weight gain; swelling or edema; medication-induced neural toxicity; particulate matter embolism and blood vessel occlusion with resultant organ, and/or nervous system infarction; and/or aseptic necrosis of one or more joints. Finally, the patient was informed that Medicine is not an exact science; therefore, there is also the possibility of unforeseen or unpredictable risks and/or possible  complications that may result in a catastrophic outcome. The patient indicated having understood very clearly. We have given the patient no guarantees and we have made no promises. Enough time was given to the patient to ask questions, all of which were answered to the patient's satisfaction. Ms. Dung has indicated that she wanted to continue with the procedure. Attestation: I, the ordering provider, attest that I have discussed with the patient the benefits, risks, side-effects, alternatives, likelihood of achieving goals, and potential problems during recovery for the procedure that I have provided informed consent. Date  Time: 08/08/2022  9:35 AM  Pre-Procedure Preparation:  Monitoring: As per clinic protocol. Respiration, ETCO2, SpO2, BP, heart rate and rhythm monitor placed and checked for adequate function Safety Precautions: Patient was assessed for positional comfort and pressure points before starting the procedure. Time-out: I initiated and conducted the "Time-out" before starting the procedure, as per protocol. The patient was asked to participate by confirming the accuracy of the "Time Out" information. Verification of the correct person, site, and procedure were performed and confirmed by me, the nursing staff, and the patient. "Time-out" conducted as per Joint Commission's Universal Protocol (UP.01.01.01). Time: 1018  Description of Procedure:          Laterality: Bilateral. The procedure was performed in identical fashion on both sides. Targeted Levels:   L3, L4, L5,Medial Branch Level(s)  Safety Precautions: Aspiration looking for blood return was conducted prior to all injections. At no point did we inject any substances, as a needle was being advanced. Before injecting, the patient was told to immediately notify me if she was experiencing any new onset of "ringing in the ears, or metallic taste in the mouth". No attempts were made at seeking any paresthesias. Safe injection practices  and needle disposal techniques used. Medications properly checked for expiration dates. SDV (single dose vial)  medications used. After the completion of the procedure, all disposable equipment used was discarded in the proper designated medical waste containers. Local Anesthesia: Protocol guidelines were followed. The patient was positioned over the fluoroscopy table. The area was prepped in the usual manner. The time-out was completed. The target area was identified using fluoroscopy. A 12-in long, straight, sterile hemostat was used with fluoroscopic guidance to locate the targets for each level blocked. Once located, the skin was marked with an approved surgical skin marker. Once all sites were marked, the skin (epidermis, dermis, and hypodermis), as well as deeper tissues (fat, connective tissue and muscle) were infiltrated with a small amount of a short-acting local anesthetic, loaded on a 10cc syringe with a 25G, 1.5-in  Needle. An appropriate amount of time was allowed for local anesthetics to take effect before proceeding to the next step. Local Anesthetic: Lidocaine 2.0% The unused portion of the local anesthetic was discarded in the proper designated containers. Technical description of process:  L3 Medial Branch Nerve Block (MBB): The target area for the L3 medial branch is at the junction of the postero-lateral aspect of the superior articular process and the superior, posterior, and medial edge of the transverse process of L4. Under fluoroscopic guidance, a Quincke needle was inserted until contact was made with os over the superior postero-lateral aspect of the pedicular shadow (target area). After negative aspiration for blood, 96mL of the nerve block solution was injected without difficulty or complication. The needle was removed intact. L4 Medial Branch Nerve Block (MBB): The target area for the L4 medial branch is at the junction of the postero-lateral aspect of the superior articular process  and the superior, posterior, and medial edge of the transverse process of L5. Under fluoroscopic guidance, a Quincke needle was inserted until contact was made with os over the superior postero-lateral aspect of the pedicular shadow (target area). After negative aspiration for blood, 65mL of the nerve block solution was injected without difficulty or complication. The needle was removed intact. L5 Medial Branch Nerve Block (MBB): The target area for the L5 medial branch is at the junction of the postero-lateral aspect of the superior articular process and the superior, posterior, and medial edge of the sacral ala. Under fluoroscopic guidance, a Quincke needle was inserted until contact was made with os over the superior postero-lateral aspect of the pedicular shadow (target area). After negative aspiration for blood, 73mL of the nerve block solution was injected without difficulty or complication. The needle was removed intact.   12 cc solution made of 10 cc of 0.2% ropivacaine, 2 cc of Decadron 10 mg/cc.  2 cc injected at each level above bilaterally.    Once the entire procedure was completed, the treated area was cleaned, making sure to leave some of the prepping solution back to take advantage of its long term bactericidal properties.         Illustration of the posterior view of the lumbar spine and the posterior neural structures. Laminae of L2 through S1 are labeled. DPRL5, dorsal primary ramus of L5; DPRS1, dorsal primary ramus of S1; DPR3, dorsal primary ramus of L3; FJ, facet (zygapophyseal) joint L3-L4; I, inferior articular process of L4; LB1, lateral branch of dorsal primary ramus of L1; IAB, inferior articular branches from L3 medial branch (supplies L4-L5 facet joint); IBP, intermediate branch plexus; MB3, medial branch of dorsal primary ramus of L3; NR3, third lumbar nerve root; S, superior articular process of L5; SAB, superior articular branches from L4 (supplies  L4-5 facet joint also);  TP3, transverse process of L3.  Vitals:   08/08/22 1018 08/08/22 1023 08/08/22 1028 08/08/22 1030  BP: (!) 170/86 (!) 163/85 (!) 170/106 (!) 172/90  Pulse:      Resp: Temp:      TempSrc:      SpO2: 96% 98% 97% 98%  Weight:      Height:         Start Time: 1018 hrs. End Time: 1030 hrs.  Imaging Guidance (Spinal):          Type of Imaging Technique: Fluoroscopy Guidance (Spinal) Indication(s): Assistance in needle guidance and placement for procedures requiring needle placement in or near specific anatomical locations not easily accessible without such assistance. Exposure Time: Please see nurses notes. Contrast: None used. Fluoroscopic Guidance: I was personally present during the use of fluoroscopy. "Tunnel Vision Technique" used to obtain the best possible view of the target area. Parallax error corrected before commencing the procedure. "Direction-depth-direction" technique used to introduce the needle under continuous pulsed fluoroscopy. Once target was reached, antero-posterior, oblique, and lateral fluoroscopic projection used confirm needle placement in all planes. Images permanently stored in EMR. Interpretation: No contrast injected. I personally interpreted the imaging intraoperatively. Adequate needle placement confirmed in multiple planes. Permanent images saved into the patient's record.  Post-operative Assessment:  Post-procedure Vital Signs:  Pulse/HCG Rate: 8984 Temp:  (!) 97.3 F (36.3 C) Resp: 16 BP:  (!) 172/90 SpO2: 98 %  EBL: None  Complications: No immediate post-treatment complications observed by team, or reported by patient.  Note: The patient tolerated the entire procedure well. A repeat set of vitals were taken after the procedure and the patient was kept under observation following institutional policy, for this type of procedure. Post-procedural neurological assessment was performed, showing return to baseline, prior to discharge. The  patient was provided with post-procedure discharge instructions, including a section on how to identify potential problems. Should any problems arise concerning this procedure, the patient was given instructions to immediately contact us, at any time, without hesitation. In any case, we plan to contact the patient by telephone for a follow-up status report regarding this interventional procedure.  Comments:  No additional relevant information.  Plan of Care  Orders:  Orders Placed This Encounter  Procedures   DG PA[facility] C-ARM 1-60 MIN NO REPORT    Intraoperative interpretation by procedural physician at [facility].    Standing Status:   Standing    Number of Occurrences:   1    Order Specific Question:   Reason for exam:    Answer:   Assistance in needle guidance and placement for procedures requiring needle placement in or near specific anatomical locations not easily accessible without such assistance.    Medications ordered for procedure: Meds ordered this encounter  Medications   lidocaine (XYLOCAINE) 2 % (with pres) injection 400 mg   dexamethasone (DECADRON) injection 10 mg   dexamethasone (DECADRON) injection 10 mg   ropivacaine (PF) 2 mg/mL (0.2%) (NAROPIN) injection 9 mL   ropivacaine (PF) 2 mg/mL (0.2%) (NAROPIN) injection 9 mL   diazepam (VALIUM) tablet 5 mg    Make sure Flumazenil is available in the pyxis when using this medication. If oversedation occurs, administer 0.2 mg IV over 15 sec. If after 45 sec no response, administer 0.2 mg again over 1 min; may repeat at 1 min intervals; not to exceed 4 doses (1 mg)   Medications administered: We administered lidocaine,  dexamethasone, dexamethasone, ropivacaine (PF) 2 mg/mL (0.2%), ropivacaine (PF) 2 mg/mL (0.2%), and diazepam.  See the medical record for exact dosing, route, and time of administration.  Follow-up plan:   Return in about 4 weeks (around 09/05/2022) for Post Procedure Evaluation, in person.        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, B/L L3,4,5 MBNB #1 08/08/22    Recent Visits Date Type Provider Dept  07/19/22 Office Visit Edward Jolly, MD [facility]  06/27/22 Procedure visit Edward Jolly, MD [facility]  Showing recent visits within past 90 days and meeting all other requirements Today's Visits Date Type Provider Dept  08/08/22 Procedure visit Edward Jolly, MD [facility]  Showing today's visits and meeting all other requirements Future Appointments Date Type Provider Dept  09/05/22 Appointment Edward Jolly, MD [facility]  Showing future appointments within next 90 days and meeting all other requirements  Disposition: Discharge home  Discharge (Date  Time): 08/08/2022; 1045 hrs.   Primary Care Physician: Doreene Nest, NP Location: [facility] Note by: Edward Jolly, MD Date: 08/08/2022; Time: 10:58 AM  Disclaimer:  Medicine is not an exact science. The only guarantee in medicine is that nothing is guaranteed. It is important to note that the decision to proceed with this intervention was based on the information collected from the patient. The Data and conclusions were drawn from the patient's questionnaire, the interview, and the physical examination. Because the information was provided in large part by the patient, it cannot be guaranteed that it has not been purposely or unconsciously manipulated. Every effort has been made to obtain as much relevant data as possible for this evaluation. It is important to note that the conclusions that lead to this procedure are derived in large part from the available data. Always take into account that the treatment will also be dependent on availability of resources and existing treatment guidelines, considered by other Pain Management Practitioners as being common knowledge and practice,  at the time of the intervention. For Medico-Legal purposes, it is also important to point out that variation in procedural techniques and pharmacological choices are the acceptable norm. The indications, contraindications, technique, and results of the above procedure should only be interpreted and judged by a Board-Certified Interventional Pain Specialist with extensive familiarity and expertise in the same exact procedure and technique.

## 2022-08-08 NOTE — Patient Instructions (Signed)
____________________________________________________________________________________________  Post-Procedure Discharge Instructions  Instructions: Apply ice:  Purpose: This will minimize any swelling and discomfort after procedure.  When: Day of procedure, as soon as you get home. How: Fill a plastic sandwich bag with crushed ice. Cover it with a small towel and apply to injection site. How long: (15 min on, 15 min off) Apply for 15 minutes then remove x 15 minutes.  Repeat sequence on day of procedure, until you go to bed. Apply heat:  Purpose: To treat any soreness and discomfort from the procedure. When: Starting the next day after the procedure. How: Apply heat to procedure site starting the day following the procedure. How long: May continue to repeat daily, until discomfort goes away. Food intake: Start with clear liquids (like water) and advance to regular food, as tolerated.  Physical activities: Keep activities to a minimum for the first 8 hours after the procedure. After that, then as tolerated. Driving: If you have received any sedation, be responsible and do not drive. You are not allowed to drive for 24 hours after having sedation. Blood thinner: (Applies only to those taking blood thinners) You may restart your blood thinner 6 hours after your procedure. Insulin: (Applies only to Diabetic patients taking insulin) As soon as you can eat, you may resume your normal dosing schedule. Infection prevention: Keep procedure site clean and dry. Shower daily and clean area with soap and water. Post-procedure Pain Diary: Extremely important that this be done correctly and accurately. Recorded information will be used to determine the next step in treatment. For the purpose of accuracy, follow these rules: Evaluate only the area treated. Do not report or include pain from an untreated area. For the purpose of this evaluation, ignore all other areas of pain, except for the treated area. After  your procedure, avoid taking a long nap and attempting to complete the pain diary after you wake up. Instead, set your alarm clock to go off every hour, on the hour, for the initial 8 hours after the procedure. Document the duration of the numbing medicine, and the relief you are getting from it. Do not go to sleep and attempt to complete it later. It will not be accurate. If you received sedation, it is likely that you were given a medication that may cause amnesia. Because of this, completing the diary at a later time may cause the information to be inaccurate. This information is needed to plan your care. Follow-up appointment: Keep your post-procedure follow-up evaluation appointment after the procedure (usually 2 weeks for most procedures, 6 weeks for radiofrequencies). DO NOT FORGET to bring you pain diary with you.   Expect: (What should I expect to see with my procedure?) From numbing medicine (AKA: Local Anesthetics): Numbness or decrease in pain. You may also experience some weakness, which if present, could last for the duration of the local anesthetic. Onset: Full effect within 15 minutes of injected. Duration: It will depend on the type of local anesthetic used. On the average, 1 to 8 hours.  From steroids (Applies only if steroids were used): Decrease in swelling or inflammation. Once inflammation is improved, relief of the pain will follow. Onset of benefits: Depends on the amount of swelling present. The more swelling, the longer it will take for the benefits to be seen. In some cases, up to 10 days. Duration: Steroids will stay in the system x 2 weeks. Duration of benefits will depend on multiple posibilities including persistent irritating factors. Side-effects: If present, they   may typically last 2 weeks (the duration of the steroids). Frequent: Cramps (if they occur, drink Gatorade and take over-the-counter Magnesium 450-500 mg once to twice a day); water retention with temporary  weight gain; increases in blood sugar; decreased immune system response; increased appetite. Occasional: Facial flushing (red, warm cheeks); mood swings; menstrual changes. Uncommon: Long-term decrease or suppression of natural hormones; bone thinning. (These are more common with higher doses or more frequent use. This is why we prefer that our patients avoid having any injection therapies in other practices.)  Very Rare: Severe mood changes; psychosis; aseptic necrosis. From procedure: Some discomfort is to be expected once the numbing medicine wears off. This should be minimal if ice and heat are applied as instructed.  Call if: (When should I call?) You experience numbness and weakness that gets worse with time, as opposed to wearing off. New onset bowel or bladder incontinence. (Applies only to procedures done in the spine)  Emergency Numbers: Durning business hours (Monday - Thursday, 8:00 AM - 4:00 PM) (Friday, 9:00 AM - 12:00 Noon): [phone] After hours: [phone] NOTE: If you are having a problem and are unable connect with, or to talk to a provider, then go to your nearest urgent care or emergency department. If the problem is serious and urgent, please call 911. ____________________________________________________________________________________________  Facet Blocks Patient Information  Description: The facets are joints in the spine between the vertebrae.  Like any joints in the body, facets can become irritated and painful.  Arthritis can also effect the facets.  By injecting steroids and local anesthetic in and around these joints, we can temporarily block the nerve supply to them.  Steroids act directly on irritated nerves and tissues to reduce selling and inflammation which often leads to decreased pain.  Facet blocks may be done anywhere along the spine from the neck to the low back depending upon the location of your pain.   After numbing the skin with local anesthetic  (like Novocaine), a small needle is passed onto the facet joints under x-ray guidance.  You may experience a sensation of pressure while this is being done.  The entire block usually lasts about 15-25 minutes.   Conditions which may be treated by facet blocks:  Low back/buttock pain Neck/shoulder pain Certain types of headaches  Preparation for the injection:  Do not eat any solid food or dairy products within 8 hours of your appointment. You may drink clear liquid up to 3 hours before appointment.  Clear liquids include water, black coffee, juice or soda.  No milk or cream please. You may take your regular medication, including pain medications, with a sip of water before your appointment.  Diabetics should hold regular insulin (if taken separately) and take 1/2 normal NPH dose the morning of the procedure.  Carry some sugar containing items with you to your appointment. A driver must accompany you and be prepared to drive you home after your procedure. Bring all your current medications with you. An IV may be inserted and sedation may be given at the discretion of the physician. A blood pressure cuff, EKG and other monitors will often be applied during the procedure.  Some patients may need to have extra oxygen administered for a short period. You will be asked to provide medical information, including your allergies and medications, prior to the procedure.  We must know immediately if you are taking blood thinners (like Coumadin/Warfarin) or if you are allergic to IV iodine contrast (dye).    We must know if you could possible be pregnant.  Possible side-effects:  Bleeding from needle site Infection (rare, may require surgery) Nerve injury (rare) Numbness & tingling (temporary) Difficulty urinating (rare, temporary) Spinal headache (a headache worse with upright posture) Light-headedness (temporary) Pain at injection site (serveral days) Decreased blood pressure (rare,  temporary) Weakness in arm/leg (temporary) Pressure sensation in back/neck (temporary)   Call if you experience:  Fever/chills associated with headache or increased back/neck pain Headache worsened by an upright position New onset, weakness or numbness of an extremity below the injection site Hives or difficulty breathing (go to the emergency room) Inflammation or drainage at the injection site(s) Severe back/neck pain greater than usual New symptoms which are concerning to you  Please note:  Although the local anesthetic injected can often make your back or neck feel good for several hours after the injection, the pain will likely return. It takes 3-7 days for steroids to work.  You may not notice any pain relief for at least one week.  If effective, we will often do a series of 2-3 injections spaced 3-6 weeks apart to maximally decrease your pain.  After the initial series, you may be a candidate for a more permanent nerve block of the facets.  If you have any questions, please call [phone] [facility] 

## 2022-08-09 ENCOUNTER — Ambulatory Visit: Payer: Medicare HMO | Admitting: Podiatry

## 2022-08-09 ENCOUNTER — Telehealth: Payer: Self-pay | Admitting: *Deleted

## 2022-08-09 DIAGNOSIS — M79675 Pain in left toe(s): Secondary | ICD-10-CM | POA: Diagnosis not present

## 2022-08-09 DIAGNOSIS — M79674 Pain in right toe(s): Secondary | ICD-10-CM | POA: Diagnosis not present

## 2022-08-09 DIAGNOSIS — B351 Tinea unguium: Secondary | ICD-10-CM | POA: Diagnosis not present

## 2022-08-09 NOTE — Progress Notes (Signed)
   SUBJECTIVE Patient presents to office today complaining of elongated, thickened nails that cause pain while ambulating in shoes.  Patient is unable to trim their own nails. Patient is here for further evaluation and treatment.  Past Medical History:  Diagnosis Date   Allergy    Chronic back pain    Herpes zoster without complication 10/09/2020   History of kidney stones    Osteoporosis    Rheumatoid arthritis (HCC)     OBJECTIVE General Patient is awake, alert, and oriented x 3 and in no acute distress. Derm Skin is dry and supple bilateral. Negative open lesions or macerations. Remaining integument unremarkable. Nails are tender, long, thickened and dystrophic with subungual debris, consistent with onychomycosis, 1-5 bilateral. No signs of infection noted. Vasc  DP and PT pedal pulses palpable bilaterally. Temperature gradient within normal limits.  Neuro Epicritic and protective threshold sensation grossly intact bilaterally.  Musculoskeletal Exam No symptomatic pedal deformities noted bilateral. Muscular strength within normal limits.  ASSESSMENT 1.  Pain due to onychomycosis of toenails both  PLAN OF CARE 1. Patient evaluated today.  2. Instructed to maintain good pedal hygiene and foot care.  3. Mechanical debridement of nails 1-5 bilaterally performed using a nail nipper. Filed with dremel without incident.  4. Return to clinic in 3 mos.    Felecia Shelling, DPM [facility]  Dr. Felecia Shelling, DPM    [address] [place], [place] [address]                Office [phone]  Fax [phone]

## 2022-08-09 NOTE — Telephone Encounter (Signed)
Post procedure call; reports that she is doing well.   

## 2022-09-05 ENCOUNTER — Ambulatory Visit: Payer: Medicare HMO | Admitting: Student in an Organized Health Care Education/Training Program

## 2022-10-03 ENCOUNTER — Ambulatory Visit: Payer: Medicare HMO | Admitting: Student in an Organized Health Care Education/Training Program

## 2022-10-26 ENCOUNTER — Ambulatory Visit
Payer: Medicare HMO | Attending: Student in an Organized Health Care Education/Training Program | Admitting: Student in an Organized Health Care Education/Training Program

## 2022-10-26 ENCOUNTER — Encounter: Payer: Self-pay | Admitting: Student in an Organized Health Care Education/Training Program

## 2022-10-26 VITALS — BP 125/65 | HR 89 | Temp 97.3°F | Ht 64.0 in | Wt 142.0 lb

## 2022-10-26 DIAGNOSIS — M47816 Spondylosis without myelopathy or radiculopathy, lumbar region: Secondary | ICD-10-CM | POA: Insufficient documentation

## 2022-10-26 DIAGNOSIS — Z981 Arthrodesis status: Secondary | ICD-10-CM | POA: Insufficient documentation

## 2022-10-26 DIAGNOSIS — G894 Chronic pain syndrome: Secondary | ICD-10-CM | POA: Insufficient documentation

## 2022-10-26 MED ORDER — ALPHA-LIPOIC ACID 600 MG PO CAPS: 600.0000 mg | ORAL_CAPSULE | Freq: Every day | ORAL | 5 refills | Status: DC

## 2022-10-26 NOTE — Progress Notes (Signed)
Safety precautions to be maintained throughout the outpatient stay will include: orient to surroundings, keep bed in low position, maintain call bell within reach at all times, provide assistance with transfer out of bed and ambulation.  

## 2022-10-26 NOTE — Progress Notes (Signed)
PROVIDER NOTE: Information contained herein reflects review and annotations entered in association with encounter. Interpretation of such information and data should be left to medically-trained personnel. Information provided to patient can be located elsewhere in the medical record under "Patient Instructions". Document created using STT-dictation technology, any transcriptional errors that may result from process are unintentional.    Patient: Casey [name]  Service Category: E/M  Provider: Gillis Santa, MD  DOB: 03-13-1946  DOS: 10/26/2022  Referring Provider: Pleas Koch, NP  MRN: [id number]  Specialty: Interventional Pain Management  PCP: Pleas Koch, NP  Type: Established Patient  Setting: Ambulatory outpatient    Location: Office  Delivery: Face-to-face     HPI  [name], a 76 y.o. year old female, is here today because of her Lumbar facet arthropathy [M47.816]. [name] primary complain today is Rectal Pain (bilateral) Last encounter: My last encounter with her was on 10/03/2022. Pertinent problems: [name] has Chronic radicular lumbar pain; Lumbar radiculopathy; Lumbar facet arthropathy; Spinal stenosis, lumbar region, with neurogenic claudication; and Chronic pain syndrome on their pertinent problem list. Pain Assessment: Severity of Chronic pain is reported as a 7 /10. Location: Buttocks Right, Left/ . Onset: More than a month ago. Quality: Aching, Constant. Timing: Constant. Modifying factor(s): denies. Vitals:  height is _0  (1.626 m) and weight is 142 lb (64.4 kg). Her temporal temperature is 97.3 F (36.3 C) (abnormal). Her blood pressure is 125/65 and her pulse is 89. Her oxygen saturation is 100%.  BMI: Estimated body mass index is 24.37 kg/m as calculated from the following:   Height as of this encounter: _1  (1.626 m).   Weight as of this encounter: 142 lb (64.4 kg).  Reason for encounter: post-procedure evaluation and  assessment.   Post-procedure evaluation   Type: Lumbar Facet, Medial Branch Block(s) #1  Laterality: Bilateral  Level: L3, L4, L5,  Medial Branch Level(s). Injecting these levels blocks the L3-4 and L4-5 lumbar facet joints. Imaging: Fluoroscopic guidance         Anesthesia: Local anesthesia (1-2% Lidocaine) Anxiolysis: Oral Valium 5 mg Sedation: Minimal Sedation                       DOS: 08/08/2022 Performed by: Gillis Santa, MD  Primary Purpose: Diagnostic/Therapeutic Indications: Low back pain severe enough to impact quality of life or function. 1. Lumbar facet arthropathy   2. History of lumbar fusion   3. Chronic pain syndrome    NAS-11 Pain score:   Pre-procedure: 10-Worst pain ever/10   Post-procedure: 8 /10      Effectiveness:  Initial hour after procedure: 0 %  Subsequent 4-6 hours post-procedure: 0 %  Analgesia past initial 6 hours: 0 %  Ongoing improvement:  Analgesic:  0%   ROS  Constitutional: Denies any fever or chills Gastrointestinal: No reported hemesis, hematochezia, vomiting, or acute GI distress Musculoskeletal:  persistent axial low back pain Neurological: No reported episodes of acute onset apraxia, aphasia, dysarthria, agnosia, amnesia, paralysis, loss of coordination, or loss of consciousness  Medication Review  Adalimumab, Alpha-Lipoic Acid, Elderberry, Melatonin, acetaminophen, cholecalciferol, etodolac, gabapentin, lidocaine, methocarbamol, multivitamin with minerals, polyethylene glycol, pravastatin, predniSONE, and vitamin C  History Review  Allergy: [name] is allergic to leflunomide and methotrexate. Drug: [name]  reports no history of drug use. Alcohol:  reports no history of alcohol use. Tobacco:  reports that she has never smoked. She has never used smokeless tobacco. Social: [name]  reports that she has never smoked. She has never used smokeless tobacco. She reports that she does not drink alcohol and does not use  drugs. Medical:  has a past medical history of Allergy, Chronic back pain, Herpes zoster without complication (95/28/4132), History of kidney stones, Osteoporosis, and Rheumatoid arthritis ([place]). Surgical: Casey [name]  has a past surgical history that includes Epidural block injection; Neck surgery; great toe (Left); Rotator cuff repair (Right); Anterior lateral lumbar fusion with percutaneous screw 1 level (N/A, 06/17/2020); and Lumbar laminectomy/decompression microdiscectomy (Right, 06/17/2020). Family: family history includes Bone cancer in her brother; Heart attack in her brother and mother; Mental illness in her mother.  Laboratory Chemistry Profile   Renal Lab Results  Component Value Date   BUN 10 12/14/2021   CREATININE 0.79 44/11/270   BCR NOT APPLICABLE 53/66/4403   GFR 72.17 12/14/2021   GFRAA >60 06/11/2020   GFRNONAA >60 06/11/2020    Hepatic Lab Results  Component Value Date   AST 17 03/10/2022   ALT 7 03/10/2022   ALBUMIN 3.9 03/10/2022   ALKPHOS 57 03/10/2022    Electrolytes Lab Results  Component Value Date   NA 140 12/14/2021   K 4.5 12/14/2021   CL 105 12/14/2021   CALCIUM 9.4 12/14/2021    Bone No results found for: "VD25OH", "VD125OH2TOT", "KV4259DG3", "OV5643PI9", "25OHVITD1", "25OHVITD2", "25OHVITD3", "TESTOFREE", "TESTOSTERONE"  Inflammation (CRP: Acute Phase) (ESR: Chronic Phase) No results found for: "CRP", "ESRSEDRATE", "LATICACIDVEN"       Note: Above Lab results reviewed.   Physical Exam  General appearance: Well nourished, well developed, and well hydrated. In no apparent acute distress Mental status: Alert, oriented x 3 (person, place, & time)       Respiratory: No evidence of acute respiratory distress Eyes: PERLA Vitals: BP 125/65   Pulse 89   Temp (!) 97.3 F (36.3 C) (Temporal)   Ht _0  (1.626 m)   Wt 142 lb (64.4 kg)   SpO2 100%   BMI 24.37 kg/m  BMI: Estimated body mass index is 24.37 kg/m as calculated from the following:    Height as of this encounter: _1  (1.626 m).   Weight as of this encounter: 142 lb (64.4 kg). Ideal: Ideal body weight: 54.7 kg (120 lb 9.5 oz) Adjusted ideal body weight: 58.6 kg (129 lb 2.5 oz)     Lumbar Exam  Skin & Axial Inspection: Healed scar from prior spine surgery Alignment: Symmetrical Functional ROM: Decreased ROM affecting both sides Stability: No instability detected Muscle Tone/Strength: Functionally intact. No obvious neuro-muscular anomalies detected. Sensory (Neurological): Dermatomal pain pattern Positive straight leg raise test bilaterally   Gait & Posture Assessment  Ambulation: Unassisted Gait: Relatively normal for age and body habitus Posture: Difficulty standing up straight   Lower Extremity Exam      Side: Right lower extremity   Side: Left lower extremity  Stability: No instability observed           Stability: No instability observed          Skin & Extremity Inspection: Skin color, temperature, and hair growth are WNL. No peripheral edema or cyanosis. No masses, redness, swelling, asymmetry, or associated skin lesions. No contractures.   Skin & Extremity Inspection: Skin color, temperature, and hair growth are WNL. No peripheral edema or cyanosis. No masses, redness, swelling, asymmetry, or associated skin lesions. No contractures.  Functional ROM: Unrestricted ROM  Functional ROM: Unrestricted ROM                  Muscle Tone/Strength: Functionally intact. No obvious neuro-muscular anomalies detected.   Muscle Tone/Strength: Functionally intact. No obvious neuro-muscular anomalies detected.  Sensory (Neurological):   Neurogenic/dermatomal   Sensory (Neurological): Neurogenic/dermatomal       DTR: Patellar: deferred today Achilles: deferred today Plantar: deferred today   DTR: Patellar: deferred today Achilles: deferred today Plantar: deferred today  Palpation: No palpable anomalies   Palpation: No palpable anomalies    4+ out of 5  strength bilateral lower extremity: Plantar flexion, dorsiflexion, knee flexion, knee extension.    Assessment   Diagnosis Status  1. Lumbar facet arthropathy   2. History of lumbar fusion   3. Chronic pain syndrome    Persistent Unchanged Persistent    [name] has a current medication list which includes the following long-term medication(s): adalimumab and pravastatin.  Pharmacotherapy (Medications Ordered): Meds ordered this encounter  Medications   Alpha-Lipoic Acid 600 MG CAPS    Sig: Take 1 capsule (600 mg total) by mouth daily.    Dispense:  30 capsule    Refill:  5    Do not place medication on "Automatic Refill". Fill one day early if pharmacy is closed on scheduled refill date.     Follow-up plan:   Return for PRN.     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 Showing future appointments within next 90 days and meeting all other requirements  I discussed the assessment and treatment plan with the patient. The patient was provided an opportunity to ask questions and all were answered. The patient agreed with the plan and demonstrated an understanding of the instructions.  Patient advised to call back or seek an in-person evaluation if the symptoms or condition worsens.  Duration of encounter: 25mnutes.  Total time on encounter, as per AMA guidelines included both the face-to-face and non-face-to-face time personally spent by the physician and/or  other qualified health care professional(s) on the day of the encounter (includes time in activities that require the physician or other qualified health care professional and does not include time in activities normally performed by clinical staff). Physician's time may include the following activities when performed: preparing to see the patient (eg, review of tests, pre-charting review of records) obtaining and/or reviewing separately obtained history performing a medically appropriate examination and/or evaluation counseling and educating the patient/family/caregiver ordering medications, tests, or procedures referring and communicating with other health care professionals (when not separately reported) documenting clinical information in the electronic or other health record independently interpreting results (not separately reported) and communicating results to the patient/ family/caregiver care coordination (not separately reported)  Note by: BGillis Santa MD Date: 10/26/2022; Time: 11:54 AM

## 2022-11-03 ENCOUNTER — Ambulatory Visit: Payer: Medicare HMO | Admitting: Primary Care

## 2022-11-10 ENCOUNTER — Encounter: Payer: Self-pay | Admitting: Podiatry

## 2022-11-10 ENCOUNTER — Ambulatory Visit: Payer: Medicare HMO | Admitting: Podiatry

## 2022-11-10 DIAGNOSIS — B351 Tinea unguium: Secondary | ICD-10-CM

## 2022-11-10 DIAGNOSIS — M79674 Pain in right toe(s): Secondary | ICD-10-CM

## 2022-11-10 DIAGNOSIS — M79675 Pain in left toe(s): Secondary | ICD-10-CM | POA: Diagnosis not present

## 2022-11-10 NOTE — Progress Notes (Signed)

## 2022-11-15 ENCOUNTER — Ambulatory Visit (INDEPENDENT_AMBULATORY_CARE_PROVIDER_SITE_OTHER): Payer: Medicare HMO | Admitting: Primary Care

## 2022-11-15 ENCOUNTER — Encounter: Payer: Self-pay | Admitting: *Deleted

## 2022-11-15 ENCOUNTER — Encounter: Payer: Self-pay | Admitting: Primary Care

## 2022-11-15 VITALS — BP 132/78 | HR 75 | Temp 98.1°F | Ht 64.0 in | Wt 140.0 lb

## 2022-11-15 DIAGNOSIS — M5136 Other intervertebral disc degeneration, lumbar region: Secondary | ICD-10-CM

## 2022-11-15 DIAGNOSIS — G8929 Other chronic pain: Secondary | ICD-10-CM | POA: Diagnosis not present

## 2022-11-15 DIAGNOSIS — M47816 Spondylosis without myelopathy or radiculopathy, lumbar region: Secondary | ICD-10-CM | POA: Diagnosis not present

## 2022-11-15 DIAGNOSIS — M51369 Other intervertebral disc degeneration, lumbar region without mention of lumbar back pain or lower extremity pain: Secondary | ICD-10-CM

## 2022-11-15 DIAGNOSIS — M5416 Radiculopathy, lumbar region: Secondary | ICD-10-CM | POA: Diagnosis not present

## 2022-11-15 NOTE — Assessment & Plan Note (Signed)
Reviewed MRI from May 2023. Referral placed to spine specialist per patient request.

## 2022-11-15 NOTE — Assessment & Plan Note (Signed)
Reviewed MRI from May 2023, reviewed pain management notes from November 2023. Referral placed to spine specialist per patient request.

## 2022-11-15 NOTE — Progress Notes (Signed)
Subjective:    Patient ID: Casey [name], female    DOB: October 12, 1946, 76 y.o.   MRN: [id number]  HPI  Casey [name] is a very pleasant 76 y.o. 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   Following with pain management for chronic and ongoing lumbar spine pain, last office visit was in November 2023. In May 2023 she underwent MRI lumbar spine which revealed multiple level disc narrowing, collapse, bulging, and stenosis. She was recently told by her pain management provider that there was nothing else he can do. She has undergone several spinal nerve blocks with short term to no improvement.   Evaluated by neurosurgery in the past, underwent lumbar fusion in 2021 per Dr. Marcell Barlow. No improvement in symptoms since surgery.   She continues with ongoing lower back pain with bilateral sciatica. "It feels like I'm sitting on a bunch of nerves". She is managed on gabapentin, etodolac, and lidocaine patches as needed. She has not discussed her ongoing back pain with her new rheumatology provider.   Review of Systems  Musculoskeletal:  Positive for arthralgias and back pain.  Skin:  Negative for color change.  Neurological:  Positive for numbness.         Past Medical History:  Diagnosis Date   Allergy    Chronic back pain    Herpes zoster without complication 10/09/2020   History of kidney stones    Osteoporosis    Rheumatoid arthritis (HCC)     Social History   Socioeconomic History   Marital status: Widowed    Spouse name: Not on file   Number of children: Not on file   Years of education: Not on file   Highest education level: Not on file  Occupational History   Not on file  Tobacco Use   Smoking status: Never   Smokeless tobacco: Never  Vaping Use   Vaping Use: Never used  Substance and Sexual Activity    Alcohol use: Never   Drug use: Never   Sexual activity: Not on file  Other Topics Concern   Not on file  Social History Narrative   Not on file   Social Determinants of Health   Financial Resource Strain: Low Risk  (06/01/2022)   Overall Financial Resource Strain (CARDIA)    Difficulty of Paying Living Expenses: Not hard at all  Food Insecurity: No Food Insecurity (06/01/2022)   Hunger Vital Sign    Worried About Running Out of Food in the Last Year: Never true    Ran Out of Food in the Last Year: Never true  Transportation Needs: No Transportation Needs (06/01/2022)   PRAPARE - Administrator, Civil Service (Medical): No    Lack of Transportation (Non-Medical): No  Physical Activity: Insufficiently Active (06/01/2022)   Exercise Vital Sign    Days of Exercise per Week: 1 day    Minutes of Exercise per Session: 10 min  Stress: No Stress Concern Present (06/01/2022)   [employer] of Occupational Health - Occupational Stress Questionnaire    Feeling of Stress : Not at all  Social Connections: Moderately Integrated (06/01/2022)   Social Connection and Isolation Panel [NHANES]    Frequency of Communication with Friends and Family: More than three times a week    Frequency of Social Gatherings with Friends and Family: More than three times a  week    Attends Religious Services: More than 4 times per year    Active Member of Clubs or Organizations: Yes    Attends Banker Meetings: More than 4 times per year    Marital Status: Widowed  Intimate Partner Violence: Not At Risk (06/01/2022)   Humiliation, Afraid, Rape, and Kick questionnaire    Fear of Current or Ex-Partner: No    Emotionally Abused: No    Physically Abused: No    Sexually Abused: No    Past Surgical History:  Procedure Laterality Date   ANTERIOR LATERAL LUMBAR FUSION WITH PERCUTANEOUS SCREW 1 LEVEL N/A 06/17/2020   Procedure: L4-5 EXTREME LATERAL INTERBODY FUSION(XLIF)/POSTERIOR SPINAL FUSION  (PSF);  Surgeon: Venetia Night, MD;  Location: [facility] OR[facility];  Service: Neurosurgery;  Laterality: N/A;   EPIDURAL BLOCK INJECTION     great toe Left    straightened and pinned   LUMBAR LAMINECTOMY/DECOMPRESSION MICRODISCECTOMY Right 06/17/2020   Procedure: RIGHT L2-3 MICRODISCECTOMY;  Surgeon: Venetia Night, MD;  Location: [facility] OR[facility];  Service: Neurosurgery;  Laterality: Right;   NECK SURGERY     ROTATOR CUFF REPAIR Right     Family History  Problem Relation Age of Onset   Heart attack Mother    Mental illness Mother    Bone cancer Brother    Heart attack Brother     Allergies  Allergen Reactions   Leflunomide Other (See Comments)    GI upset     Methotrexate Other (See Comments)    GI upset    Current Outpatient Medications on File Prior to Visit  Medication Sig Dispense Refill   acetaminophen (TYLENOL) 500 MG tablet Take 2 tablets (1,000 mg total) by mouth every 6 (six) hours as needed for mild pain. 30 tablet 0   Adalimumab 40 MG/0.8ML PNKT Inject 0.8 mLs into the skin every 14 (fourteen) days.     Ascorbic Acid (VITAMIN C) 1000 MG tablet Take 1,000 mg by mouth daily.     cholecalciferol (VITAMIN D) 25 MCG (1000 UNIT) tablet Take 1,000 Units by mouth daily.     ELDERBERRY PO Take 1 tablet by mouth daily.     etodolac (LODINE) 400 MG tablet Take 400 mg by mouth 2 (two) times daily.     gabapentin (NEURONTIN) 300 MG capsule TAKE 1 CAPSULE BY MOUTH THREE TIMES A DAY     Melatonin 10 MG TABS Take 10 mg by mouth at bedtime as needed (sleep.).     methocarbamol (ROBAXIN) 500 MG tablet TAKE 1.5 TABLETS (750 MG TOTAL) BY MOUTH EVERY 6 (SIX) HOURS.     Multiple Vitamin (MULTIVITAMIN WITH MINERALS) TABS tablet Take 1 tablet by mouth daily. Centrum Silver     polyethylene glycol (MIRALAX / GLYCOLAX) 17 g packet Take 17 g by mouth daily as needed for mild constipation. 14 each 0   pravastatin (PRAVACHOL) 40 MG tablet Take 1 tablet (40 mg total) by mouth daily. For cholesterol 90  tablet 3   Alpha-Lipoic Acid 600 MG CAPS Take 1 capsule (600 mg total) by mouth daily. (Patient not taking: Reported on 11/15/2022) 30 capsule 5   lidocaine (LIDODERM) 5 % Place 1 patch onto the skin daily. Remove & Discard patch within 12 hours or as directed by MD (Patient not taking: Reported on 11/15/2022) 30 patch 0   No current facility-administered medications on file prior to visit.    BP 132/78   Pulse 75   Temp 98.1 F (36.7 C) (Temporal)   Ht 5'  4" (1.626 m)   Wt 140 lb (63.5 kg)   SpO2 98%   BMI 24.03 kg/m  Objective:   Physical Exam Cardiovascular:     Rate and Rhythm: Normal rate and regular rhythm.  Pulmonary:     Effort: Pulmonary effort is normal.     Breath sounds: Normal breath sounds.  Musculoskeletal:     Cervical back: Neck supple.  Skin:    General: Skin is warm and dry.           Assessment & Plan:   Problem List Items Addressed This Visit       Musculoskeletal and Integument   Lumbar spondylosis - Primary   Relevant Orders   Ambulatory referral to Spine Surgery   Lumbar degenerative disc disease    Reviewed MRI from May 2023. Referral placed to spine specialist per patient request.      Relevant Orders   Ambulatory referral to Spine Surgery     Other   Chronic radicular lumbar pain    Reviewed MRI from May 2023, reviewed pain management notes from November 2023. Referral placed to spine specialist per patient request.             Doreene Nest, NP

## 2022-11-15 NOTE — Patient Instructions (Addendum)
You will either be contacted via phone regarding your referral to the spine specialist, or you may receive a letter on your MyChart portal from our referral team with instructions for scheduling an appointment. Please let us know if you have not been contacted by anyone within two weeks.  Set up your physical for late Janaury 2024.  It was a pleasure to see you today!

## 2022-12-13 ENCOUNTER — Other Ambulatory Visit (HOSPITAL_COMMUNITY): Payer: Self-pay | Admitting: Neurosurgery

## 2022-12-13 ENCOUNTER — Other Ambulatory Visit: Payer: Self-pay | Admitting: Neurosurgery

## 2022-12-13 DIAGNOSIS — M48062 Spinal stenosis, lumbar region with neurogenic claudication: Secondary | ICD-10-CM

## 2022-12-14 ENCOUNTER — Ambulatory Visit (HOSPITAL_COMMUNITY)
Admission: RE | Admit: 2022-12-14 | Discharge: 2022-12-14 | Disposition: A | Discharge status: Home / Self Care | Payer: Medicare HMO | Source: Ambulatory Visit | Attending: Neurosurgery | Admitting: Neurosurgery

## 2022-12-14 ENCOUNTER — Other Ambulatory Visit: Payer: Self-pay

## 2022-12-14 ENCOUNTER — Other Ambulatory Visit (HOSPITAL_COMMUNITY): Payer: Self-pay | Admitting: Neurosurgery

## 2022-12-14 DIAGNOSIS — M4726 Other spondylosis with radiculopathy, lumbar region: Secondary | ICD-10-CM | POA: Diagnosis not present

## 2022-12-14 DIAGNOSIS — M48062 Spinal stenosis, lumbar region with neurogenic claudication: Secondary | ICD-10-CM | POA: Diagnosis present

## 2022-12-14 DIAGNOSIS — M2578 Osteophyte, vertebrae: Secondary | ICD-10-CM | POA: Diagnosis not present

## 2022-12-14 DIAGNOSIS — M79604 Pain in right leg: Secondary | ICD-10-CM | POA: Insufficient documentation

## 2022-12-14 DIAGNOSIS — M79605 Pain in left leg: Secondary | ICD-10-CM | POA: Diagnosis not present

## 2022-12-14 MED ORDER — LIDOCAINE HCL (PF) 1 % IJ SOLN
5.0000 mL | Freq: Once | INTRAMUSCULAR | Status: AC
  Administered 2022-12-14: 5 mL via INTRADERMAL

## 2022-12-14 MED ORDER — ACETAMINOPHEN 325 MG PO TABS: 650.0000 mg | ORAL_TABLET | ORAL | Status: DC | PRN

## 2022-12-14 MED ORDER — IOHEXOL 180 MG/ML  SOLN
20.0000 mL | Freq: Once | INTRAMUSCULAR | Status: AC | PRN
  Administered 2022-12-14: 12 mL via INTRATHECAL

## 2022-12-14 NOTE — Discharge Instructions (Signed)
Myelogram and Lumbar Puncture Discharge Instructions  Go home and rest quietly for the next 24 hours.  It is important to lie flat for the next 24 hours.  Get up only to go to the restroom.  You may lie in the bed or on a couch on your back, your stomach, your left side or your right side.  You may have one pillow under your head.  You may have pillows between your knees while you are on your side or under your knees while you are on your back.  DO NOT drive today.  Recline the seat as far back as it will go, while still wearing your seat belt, on the way home.  You may get up to go to the bathroom as needed.  You may sit up for 10 minutes to eat.  You may resume your normal diet and medications unless otherwise indicated.  The incidence of headache, nausea, or vomiting is about 5% (one in 20 patients).  If you develop a headache, lie flat and drink plenty of fluids until the headache goes away.  Caffeinated beverages may be helpful.  If you develop severe nausea and vomiting or a headache that does not go away with flat bed rest, call [phone].  You may resume normal activities after your 24 hours of bed rest is over; however, do not exert yourself strongly or do any heavy lifting tomorrow.  Call your physician for a follow-up appointment.  The results of your myelogram will be sent directly to your physician by the following day.  If you have any questions or if complications develop after you arrive home, please call [phone].  Discharge instructions have been explained to the patient.  The patient, or the person responsible for the patient, fully understands these instructions.   

## 2022-12-14 NOTE — Procedures (Addendum)
PROCEDURE SUMMARY:  Successful image-guided lumbar myelogram at level of L3-4. 13 mL of Omnipaque 180 was injected.  No immediate complications.  EBL = trace. Patient tolerated well.   Post procedural order placed.   Please see imaging section of Epic for full dictation.   Armando Gang Zaydn Gutridge PA-C 12/14/2022 9:45 AM

## 2022-12-14 NOTE — Progress Notes (Signed)
No orders, Dr Christella Noa was paged.

## 2022-12-20 ENCOUNTER — Encounter: Payer: Medicare HMO | Admitting: Primary Care

## 2022-12-26 ENCOUNTER — Other Ambulatory Visit: Payer: Self-pay | Admitting: Neurosurgery

## 2022-12-29 HISTORY — PX: BACK SURGERY: SHX140

## 2022-12-30 ENCOUNTER — Other Ambulatory Visit: Payer: Self-pay

## 2022-12-30 ENCOUNTER — Encounter (HOSPITAL_COMMUNITY): Payer: Self-pay

## 2022-12-30 ENCOUNTER — Emergency Department (HOSPITAL_COMMUNITY)
Admission: EM | Admit: 2022-12-30 | Discharge: 2022-12-30 | Disposition: A | Discharge status: Home / Self Care | Payer: Medicare HMO | Attending: Emergency Medicine | Admitting: Emergency Medicine

## 2022-12-30 DIAGNOSIS — M545 Low back pain, unspecified: Secondary | ICD-10-CM | POA: Insufficient documentation

## 2022-12-30 DIAGNOSIS — G8929 Other chronic pain: Secondary | ICD-10-CM | POA: Diagnosis not present

## 2022-12-30 DIAGNOSIS — M791 Myalgia, unspecified site: Secondary | ICD-10-CM | POA: Insufficient documentation

## 2022-12-30 DIAGNOSIS — M5459 Other low back pain: Secondary | ICD-10-CM | POA: Diagnosis not present

## 2022-12-30 MED ORDER — PREDNISONE 20 MG PO TABS: 40.0000 mg | ORAL_TABLET | Freq: Every day | ORAL | 0 refills | Status: AC

## 2022-12-30 MED ORDER — LIDOCAINE 5 % EX PTCH: 1.0000 | MEDICATED_PATCH | CUTANEOUS | 0 refills | Status: DC

## 2022-12-30 MED ORDER — METHYLPREDNISOLONE 4 MG PO TBPK: ORAL_TABLET | ORAL | 0 refills | Status: DC

## 2022-12-30 NOTE — ED Provider Notes (Signed)
[facility] Provider Note   CSN: [id number] Arrival date & time: 12/30/22  2585     History  Chief Complaint  Patient presents with   Generalized Body Aches    Casey [name] is a 77 y.o. female.  Patient here with acute on chronic back pain.  Pain mostly in the low back going down the legs at times.  She is scheduled for surgery next week.  She denies any loss of bowel or bladder.  Denies any weakness.  Mostly tingling pain.  She has been taking gabapentin with minimal relief.  The history is provided by the patient.       Home Medications Prior to Admission medications   Medication Sig Start Date End Date Taking? Authorizing Provider  predniSONE (DELTASONE) 20 MG tablet Take 2 tablets (40 mg total) by mouth daily for 4 doses. 12/30/22 01/03/23 Yes Casey Wamser, DO  acetaminophen (TYLENOL) 500 MG tablet Take 2 tablets (1,000 mg total) by mouth every 6 (six) hours as needed for mild pain. 06/19/20   Casey Face, NP  Adalimumab 40 MG/0.8ML PNKT Inject 0.8 mLs into the skin every 14 (fourteen) days. 07/27/18   [provider]  Alpha-Lipoic Acid 600 MG CAPS Take 1 capsule (600 mg total) by mouth daily. Patient not taking: Reported on 11/15/2022 10/26/22 04/24/23  Casey Santa, MD  Ascorbic Acid (VITAMIN C) 1000 MG tablet Take 1,000 mg by mouth daily.    [provider]  cholecalciferol (VITAMIN D) 25 MCG (1000 UNIT) tablet Take 1,000 Units by mouth daily.    [provider]  ELDERBERRY PO Take 1 tablet by mouth daily.    [provider]  etodolac (LODINE) 400 MG tablet Take 400 mg by mouth 2 (two) times daily.    [provider]  gabapentin (NEURONTIN) 300 MG capsule TAKE 1 CAPSULE BY MOUTH THREE TIMES A DAY 11/25/21   [provider]  lidocaine (LIDODERM) 5 % Place 1 patch onto the skin daily. Remove & Discard patch within 12 hours or as directed by MD 12/30/22   Casey Sites, DO   Melatonin 10 MG TABS Take 10 mg by mouth at bedtime as needed (sleep.).    [provider]  methocarbamol (ROBAXIN) 500 MG tablet TAKE 1.5 TABLETS (750 MG TOTAL) BY MOUTH EVERY 6 (SIX) HOURS. 08/10/20   [provider]  Multiple Vitamin (MULTIVITAMIN WITH MINERALS) TABS tablet Take 1 tablet by mouth daily. Centrum Silver    [provider]  polyethylene glycol (MIRALAX / GLYCOLAX) 17 g packet Take 17 g by mouth daily as needed for mild constipation. 06/19/20   Casey Face, NP  pravastatin (PRAVACHOL) 40 MG tablet Take 1 tablet (40 mg total) by mouth daily. For cholesterol 12/22/21   Casey Koch, NP      Allergies    Leflunomide and Methotrexate    Review of Systems   Review of Systems  Physical Exam Updated Vital Signs BP (!) 168/99 (BP Location: Right Arm)   Pulse 89   Temp 97.7 F (36.5 C)   Resp 17   Ht 5\' 4"  (1.626 m)   Wt 64.4 kg   SpO2 97%   BMI 24.37 kg/m  Physical Exam Vitals and nursing note reviewed.  Constitutional:      General: She is not in acute distress.    Appearance: She is well-developed.  HENT:     Head: Normocephalic and atraumatic.     Nose: Nose  normal.     Mouth/Throat:     Mouth: Mucous membranes are moist.  Eyes:     Extraocular Movements: Extraocular movements intact.     Conjunctiva/sclera: Conjunctivae normal.     Pupils: Pupils are equal, round, and reactive to light.  Cardiovascular:     Rate and Rhythm: Normal rate and regular rhythm.     Pulses: Normal pulses.     Heart sounds: Normal heart sounds. No murmur heard. Pulmonary:     Effort: Pulmonary effort is normal. No respiratory distress.     Breath sounds: Normal breath sounds.  Abdominal:     Palpations: Abdomen is soft.     Tenderness: There is no abdominal tenderness.  Musculoskeletal:        General: No swelling.     Cervical back: Neck supple.  Skin:    General: Skin is warm and dry.     Capillary Refill: Capillary refill takes less than  2 seconds.  Neurological:     General: No focal deficit present.     Mental Status: She is alert and oriented to person, place, and time.     Cranial Nerves: No cranial nerve deficit.     Sensory: No sensory deficit.     Motor: No weakness.     Coordination: Coordination normal.  Psychiatric:        Mood and Affect: Mood normal.     ED Results / Procedures / Treatments   Labs (all labs ordered are listed, but only abnormal results are displayed) Labs Reviewed - No data to display  EKG None  Radiology No results found.  Procedures Procedures    Medications Ordered in ED Medications - No data to display  ED Course/ Medical Decision Making/ A&P                             Medical Decision Making Risk Prescription drug management.   Casey [name] is here with acute on chronic back pain.  Normal vitals.  No fever.  History of chronic back pain osteoporosis.  She is due for surgery next week with neurosurgery.  Ultimately she does not have any red flag signs.  She has no loss of bowel or bladder.  No fever.  She has normal neurological exam.  Normal strength and sensation.  She is on gabapentin for pain control.  Will put her on a short burst of steroids and lidocaine patches.  Recommend continued use of Tylenol.  She is neurovascular neuromuscular intact.  Discharged in good condition.  This chart was dictated using voice recognition software.  Despite best efforts to proofread,  errors can occur which can change the documentation meaning.         Final Clinical Impression(s) / ED Diagnoses Final diagnoses:  Acute low back pain, unspecified back pain laterality, unspecified whether sciatica present    Rx / DC Orders ED Discharge Orders          Ordered    methylPREDNISolone (MEDROL DOSEPAK) 4 MG TBPK tablet  Status:  Discontinued        12/30/22 0850    lidocaine (LIDODERM) 5 %  Every 24 hours        12/30/22 0851    predniSONE (DELTASONE) 20 MG tablet   Daily        12/30/22 [name], Doland, DO 12/30/22 [phone]

## 2022-12-30 NOTE — Discharge Instructions (Addendum)
I have prescribed you medication to help with your pain until your surgery.

## 2022-12-30 NOTE — ED Triage Notes (Signed)
Pt arrived POV from home c/o pain all over ever since she had surgery 2 years ago.

## 2023-01-02 ENCOUNTER — Telehealth: Payer: Self-pay

## 2023-01-02 NOTE — Telephone Encounter (Signed)
Transition Care Management Unsuccessful Follow-up Telephone Call  Date of discharge and from where:  [facility] 12-30-22 Dx: acute low back pain   Attempts:  1st Attempt  Reason for unsuccessful TCM follow-up call:  unable to leave [place] Direct Dial [phone]

## 2023-01-03 ENCOUNTER — Encounter (HOSPITAL_COMMUNITY): Payer: Self-pay | Admitting: Neurosurgery

## 2023-01-03 ENCOUNTER — Other Ambulatory Visit: Payer: Self-pay

## 2023-01-03 ENCOUNTER — Encounter: Payer: Medicare HMO | Admitting: Primary Care

## 2023-01-03 NOTE — Telephone Encounter (Signed)
Transition Care Management Follow-up Telephone Call Date of discharge and from where:   [facility] 12-30-22 Dx: acute low back pain    How have you been since you were released from the hospital? Doing ok  Any questions or concerns? No  Items Reviewed: Did the pt receive and understand the discharge instructions provided? Yes  Medications obtained and verified? Yes  Other? No  Any new allergies since your discharge? No  Dietary orders reviewed? Yes Do you have support at home? Yes   Home Care and Equipment/Supplies: Were home health services ordered? no If so, what is the name of the agency? na  Has the agency set up a time to come to the patient's home? not applicable Were any new equipment or medical supplies ordered?  No What is the name of the medical supply agency? na Were you able to get the supplies/equipment? not applicable Do you have any questions related to the use of the equipment or supplies? No  Functional Questionnaire: (I = Independent and D = Dependent) ADLs: I  Bathing/Dressing- I  Meal Prep- I  Eating- I  Maintaining continence- I  Transferring/Ambulation- I  Managing Meds- I  Follow up appointments reviewed:  PCP Hospital f/u appt confirmed? No  . [facility] f/u appt confirmed? Yes  .Pt having surgery on 01-04-23 Are transportation arrangements needed? No  If their condition worsens, is the pt aware to call PCP or go to the Emergency Dept.? Yes Was the patient provided with contact information for the PCP's office or ED? Yes Was to pt encouraged to call back with questions or concerns? Yes   Juanda Crumble LPN [place] Direct Dial [phone]

## 2023-01-03 NOTE — Progress Notes (Addendum)
PAT scheduler has called Casey [name] at least 4 days, trying to get Casey [name] a PAT appointment, the mail box to patient's mail box was full.  A nurse called Casey [name] number yesterday , mail box was full.  I called Casey [name] phone and her daughter today, both of the phones are full.  I called Casey [name], Dr. Christella Noa and left a message, I asked if she has any other numbers or if she could call patient and see if she answers and ask her to call me. Casey [name] called, she does not have any other numbers.  I called Casey [name] 3 times in a row- no voice mail, patient did not pick up. I called patient's daughter Casey [name], her voice mail is full, she picked up on the second ring.I explained that I am trying to get in tough with her mother, Casey [name] said she would call her and ask her to call me. Casey [name] called me, patient denied chest pain or shortness of breath.  Patient denies having any s/s of Covid in her household, also denies any known exposure to Covid.   I asked Casey [name] to hold herbal products and vitamins.  Casey [name]'  PCP is Alma Friendly, NP.

## 2023-01-04 ENCOUNTER — Ambulatory Visit (HOSPITAL_COMMUNITY): Payer: Medicare HMO

## 2023-01-04 ENCOUNTER — Encounter (HOSPITAL_COMMUNITY): Payer: Self-pay | Admitting: Neurosurgery

## 2023-01-04 ENCOUNTER — Encounter (HOSPITAL_COMMUNITY)
Admission: RE | Disposition: A | Discharge status: Home / Self Care | Payer: Self-pay | Source: Home / Self Care | Attending: Neurosurgery

## 2023-01-04 ENCOUNTER — Ambulatory Visit (HOSPITAL_BASED_OUTPATIENT_CLINIC_OR_DEPARTMENT_OTHER): Payer: Medicare HMO | Admitting: Anesthesiology

## 2023-01-04 ENCOUNTER — Ambulatory Visit (HOSPITAL_COMMUNITY): Payer: Medicare HMO | Admitting: Anesthesiology

## 2023-01-04 ENCOUNTER — Observation Stay (HOSPITAL_COMMUNITY)
Admission: RE | Admit: 2023-01-04 | Discharge: 2023-01-06 | Disposition: A | Discharge status: Home / Self Care | Payer: Medicare HMO | Attending: Neurosurgery | Admitting: Neurosurgery

## 2023-01-04 DIAGNOSIS — M48062 Spinal stenosis, lumbar region with neurogenic claudication: Principal | ICD-10-CM | POA: Insufficient documentation

## 2023-01-04 DIAGNOSIS — Z981 Arthrodesis status: Secondary | ICD-10-CM | POA: Diagnosis not present

## 2023-01-04 DIAGNOSIS — M960 Pseudarthrosis after fusion or arthrodesis: Secondary | ICD-10-CM | POA: Insufficient documentation

## 2023-01-04 DIAGNOSIS — S33140A Subluxation of L4/L5 lumbar vertebra, initial encounter: Secondary | ICD-10-CM | POA: Diagnosis not present

## 2023-01-04 LAB — BASIC METABOLIC PANEL
Anion gap: 9 (ref 5–15)
BUN: 14 mg/dL (ref 8–23)
CO2: 26 mmol/L (ref 22–32)
Calcium: 8.9 mg/dL (ref 8.9–10.3)
Chloride: 104 mmol/L (ref 98–111)
Creatinine, Ser: 0.79 mg/dL (ref 0.44–1.00)
GFR, Estimated: >60 mL/min (ref >60)
Glucose, Bld: 105 mg/dL — ABNORMAL HIGH (ref 70–99)
Potassium: 4.1 mmol/L (ref 3.5–5.1)
Sodium: 139 mmol/L (ref 135–145)

## 2023-01-04 LAB — CBC
HCT: 41.5 % (ref 36.0–46.0)
Hemoglobin: 13.8 g/dL (ref 12.0–15.0)
MCH: 30.3 pg (ref 26.0–34.0)
MCHC: 33.3 g/dL (ref 30.0–36.0)
MCV: 91 fL (ref 80.0–100.0)
Platelets: 230 10*3/uL (ref 150–400)
RBC: 4.56 MIL/uL (ref 3.87–5.11)
RDW: 12.9 % (ref 11.5–15.5)
WBC: 6.4 10*3/uL (ref 4.0–10.5)
nRBC: 0 % (ref 0.0–0.2)

## 2023-01-04 LAB — TYPE AND SCREEN
ABO/RH(D): O POS
Antibody Screen: NEGATIVE

## 2023-01-04 LAB — SURGICAL PCR SCREEN
MRSA, PCR: NEGATIVE
Staphylococcus aureus: POSITIVE — AB

## 2023-01-04 SURGERY — POSTERIOR LUMBAR FUSION 1 LEVEL
Anesthesia: General | Site: Spine Lumbar

## 2023-01-04 MED ORDER — FENTANYL CITRATE (PF) 250 MCG/5ML IJ SOLN
INTRAMUSCULAR | Status: AC
  Filled 2023-01-04: qty 5

## 2023-01-04 MED ORDER — LACTATED RINGERS IV SOLN: INTRAVENOUS | Status: DC

## 2023-01-04 MED ORDER — FENTANYL CITRATE (PF) 100 MCG/2ML IJ SOLN
INTRAMUSCULAR | Status: AC
  Administered 2023-01-04: 50 ug
  Filled 2023-01-04: qty 2

## 2023-01-04 MED ORDER — MENTHOL 3 MG MT LOZG: 1.0000 | LOZENGE | OROMUCOSAL | Status: DC | PRN

## 2023-01-04 MED ORDER — ACETAMINOPHEN 10 MG/ML IV SOLN
INTRAVENOUS | Status: AC
  Filled 2023-01-04: qty 100

## 2023-01-04 MED ORDER — LIDOCAINE-EPINEPHRINE 0.5 %-1:200000 IJ SOLN
INTRAMUSCULAR | Status: AC
  Filled 2023-01-04: qty 1

## 2023-01-04 MED ORDER — BUPIVACAINE HCL (PF) 0.5 % IJ SOLN
INTRAMUSCULAR | Status: AC
  Filled 2023-01-04: qty 30

## 2023-01-04 MED ORDER — OXYCODONE HCL 5 MG PO TABS
5.0000 mg | ORAL_TABLET | ORAL | Status: DC | PRN
  Administered 2023-01-04 – 2023-01-06 (×7): 5 mg via ORAL
  Filled 2023-01-04 (×6): qty 1

## 2023-01-04 MED ORDER — BUPIVACAINE HCL (PF) 0.5 % IJ SOLN
INTRAMUSCULAR | Status: DC | PRN
  Administered 2023-01-04: 10 mL

## 2023-01-04 MED ORDER — CEFAZOLIN SODIUM-DEXTROSE 2-4 GM/100ML-% IV SOLN
2.0000 g | INTRAVENOUS | Status: AC
  Administered 2023-01-04: 2 g via INTRAVENOUS
  Filled 2023-01-04: qty 100

## 2023-01-04 MED ORDER — THROMBIN 20000 UNITS EX SOLR
CUTANEOUS | Status: AC
  Filled 2023-01-04: qty 20000

## 2023-01-04 MED ORDER — MAGNESIUM CITRATE PO SOLN: 1.0000 | Freq: Once | ORAL | Status: DC | PRN

## 2023-01-04 MED ORDER — PROPOFOL 500 MG/50ML IV EMUL
INTRAVENOUS | Status: DC | PRN
  Administered 2023-01-04: 60 ug/kg/min via INTRAVENOUS

## 2023-01-04 MED ORDER — CHLORHEXIDINE GLUCONATE 0.12 % MT SOLN
15.0000 mL | Freq: Once | OROMUCOSAL | Status: AC
  Administered 2023-01-04: 15 mL via OROMUCOSAL
  Filled 2023-01-04: qty 15

## 2023-01-04 MED ORDER — ACETAMINOPHEN 500 MG PO TABS
1000.0000 mg | ORAL_TABLET | Freq: Four times a day (QID) | ORAL | Status: AC
  Administered 2023-01-04 – 2023-01-05 (×4): 1000 mg via ORAL
  Filled 2023-01-04 (×4): qty 2

## 2023-01-04 MED ORDER — SENNOSIDES-DOCUSATE SODIUM 8.6-50 MG PO TABS: 1.0000 | ORAL_TABLET | Freq: Every evening | ORAL | Status: DC | PRN

## 2023-01-04 MED ORDER — OXYCODONE HCL 5 MG PO TABS
10.0000 mg | ORAL_TABLET | ORAL | Status: DC | PRN
  Filled 2023-01-04 (×2): qty 2

## 2023-01-04 MED ORDER — PHENOL 1.4 % MT LIQD: 1.0000 | OROMUCOSAL | Status: DC | PRN

## 2023-01-04 MED ORDER — SODIUM CHLORIDE 0.9 % IV SOLN: 250.0000 mL | INTRAVENOUS | Status: DC

## 2023-01-04 MED ORDER — FENTANYL CITRATE (PF) 250 MCG/5ML IJ SOLN
INTRAMUSCULAR | Status: DC | PRN
  Administered 2023-01-04: 50 ug via INTRAVENOUS
  Administered 2023-01-04: 100 ug via INTRAVENOUS
  Administered 2023-01-04 (×2): 50 ug via INTRAVENOUS

## 2023-01-04 MED ORDER — LIDOCAINE 2% (20 MG/ML) 5 ML SYRINGE
INTRAMUSCULAR | Status: AC
  Filled 2023-01-04: qty 10

## 2023-01-04 MED ORDER — ADULT MULTIVITAMIN W/MINERALS CH
1.0000 | ORAL_TABLET | Freq: Every day | ORAL | Status: DC
  Administered 2023-01-05 – 2023-01-06 (×2): 1 via ORAL
  Filled 2023-01-04 (×2): qty 1

## 2023-01-04 MED ORDER — LACTATED RINGERS IV SOLN: INTRAVENOUS | Status: DC | PRN

## 2023-01-04 MED ORDER — PHENYLEPHRINE HCL-NACL 20-0.9 MG/250ML-% IV SOLN
INTRAVENOUS | Status: DC | PRN
  Administered 2023-01-04: 30 ug/min via INTRAVENOUS

## 2023-01-04 MED ORDER — ONDANSETRON HCL 4 MG/2ML IJ SOLN: 4.0000 mg | Freq: Once | INTRAMUSCULAR | Status: DC | PRN

## 2023-01-04 MED ORDER — ALBUMIN HUMAN 5 % IV SOLN: INTRAVENOUS | Status: DC | PRN

## 2023-01-04 MED ORDER — LIDOCAINE 2% (20 MG/ML) 5 ML SYRINGE
INTRAMUSCULAR | Status: DC | PRN
  Administered 2023-01-04: 60 mg via INTRAVENOUS

## 2023-01-04 MED ORDER — ONDANSETRON HCL 4 MG PO TABS: 4.0000 mg | ORAL_TABLET | Freq: Four times a day (QID) | ORAL | Status: DC | PRN

## 2023-01-04 MED ORDER — PRAVASTATIN SODIUM 40 MG PO TABS
40.0000 mg | ORAL_TABLET | Freq: Every day | ORAL | Status: DC
  Administered 2023-01-05 – 2023-01-06 (×2): 40 mg via ORAL
  Filled 2023-01-04 (×2): qty 1

## 2023-01-04 MED ORDER — ACETAMINOPHEN 10 MG/ML IV SOLN
INTRAVENOUS | Status: DC | PRN
  Administered 2023-01-04: 1000 mg via INTRAVENOUS

## 2023-01-04 MED ORDER — SUGAMMADEX SODIUM 200 MG/2ML IV SOLN
INTRAVENOUS | Status: DC | PRN
  Administered 2023-01-04: 200 mg via INTRAVENOUS

## 2023-01-04 MED ORDER — ACETAMINOPHEN 650 MG RE SUPP: 650.0000 mg | RECTAL | Status: DC | PRN

## 2023-01-04 MED ORDER — PROPOFOL 10 MG/ML IV BOLUS
INTRAVENOUS | Status: AC
  Filled 2023-01-04: qty 20

## 2023-01-04 MED ORDER — CHLORHEXIDINE GLUCONATE CLOTH 2 % EX PADS: 6.0000 | MEDICATED_PAD | Freq: Once | CUTANEOUS | Status: DC

## 2023-01-04 MED ORDER — SODIUM CHLORIDE 0.9% FLUSH: 3.0000 mL | INTRAVENOUS | Status: DC | PRN

## 2023-01-04 MED ORDER — FENTANYL CITRATE (PF) 100 MCG/2ML IJ SOLN
25.0000 ug | INTRAMUSCULAR | Status: DC | PRN
  Administered 2023-01-04 (×2): 50 ug via INTRAVENOUS

## 2023-01-04 MED ORDER — ROCURONIUM BROMIDE 10 MG/ML (PF) SYRINGE
PREFILLED_SYRINGE | INTRAVENOUS | Status: DC | PRN
  Administered 2023-01-04: 20 mg via INTRAVENOUS
  Administered 2023-01-04: 30 mg via INTRAVENOUS
  Administered 2023-01-04: 50 mg via INTRAVENOUS

## 2023-01-04 MED ORDER — POTASSIUM CHLORIDE IN NACL 20-0.9 MEQ/L-% IV SOLN: INTRAVENOUS | Status: DC

## 2023-01-04 MED ORDER — 0.9 % SODIUM CHLORIDE (POUR BTL) OPTIME
TOPICAL | Status: DC | PRN
  Administered 2023-01-04: 1000 mL

## 2023-01-04 MED ORDER — VITAMIN C 500 MG PO TABS
250.0000 mg | ORAL_TABLET | Freq: Every day | ORAL | Status: DC
  Administered 2023-01-05 – 2023-01-06 (×2): 250 mg via ORAL
  Filled 2023-01-04 (×2): qty 1

## 2023-01-04 MED ORDER — DIAZEPAM 5 MG PO TABS
5.0000 mg | ORAL_TABLET | Freq: Four times a day (QID) | ORAL | Status: DC | PRN
  Administered 2023-01-04: 5 mg via ORAL
  Filled 2023-01-04: qty 1

## 2023-01-04 MED ORDER — BISACODYL 5 MG PO TBEC: 5.0000 mg | DELAYED_RELEASE_TABLET | Freq: Every day | ORAL | Status: DC | PRN

## 2023-01-04 MED ORDER — ROCURONIUM BROMIDE 10 MG/ML (PF) SYRINGE
PREFILLED_SYRINGE | INTRAVENOUS | Status: AC
  Filled 2023-01-04: qty 20

## 2023-01-04 MED ORDER — ORAL CARE MOUTH RINSE: 15.0000 mL | Freq: Once | OROMUCOSAL | Status: AC

## 2023-01-04 MED ORDER — ONDANSETRON HCL 4 MG/2ML IJ SOLN
INTRAMUSCULAR | Status: AC
  Filled 2023-01-04: qty 4

## 2023-01-04 MED ORDER — DEXAMETHASONE SODIUM PHOSPHATE 10 MG/ML IJ SOLN
INTRAMUSCULAR | Status: AC
  Filled 2023-01-04: qty 2

## 2023-01-04 MED ORDER — ONDANSETRON HCL 4 MG/2ML IJ SOLN
INTRAMUSCULAR | Status: DC | PRN
  Administered 2023-01-04: 4 mg via INTRAVENOUS

## 2023-01-04 MED ORDER — FENTANYL CITRATE (PF) 100 MCG/2ML IJ SOLN: 25.0000 ug | INTRAMUSCULAR | Status: DC | PRN

## 2023-01-04 MED ORDER — SENNA 8.6 MG PO TABS
1.0000 | ORAL_TABLET | Freq: Two times a day (BID) | ORAL | Status: DC
  Administered 2023-01-04 – 2023-01-06 (×4): 8.6 mg via ORAL
  Filled 2023-01-04 (×4): qty 1

## 2023-01-04 MED ORDER — DEXAMETHASONE SODIUM PHOSPHATE 10 MG/ML IJ SOLN
INTRAMUSCULAR | Status: DC | PRN
  Administered 2023-01-04: 10 mg via INTRAVENOUS

## 2023-01-04 MED ORDER — GABAPENTIN 300 MG PO CAPS
300.0000 mg | ORAL_CAPSULE | Freq: Three times a day (TID) | ORAL | Status: DC
  Administered 2023-01-04 – 2023-01-06 (×5): 300 mg via ORAL
  Filled 2023-01-04 (×5): qty 1

## 2023-01-04 MED ORDER — MELATONIN 5 MG PO TABS
10.0000 mg | ORAL_TABLET | Freq: Every evening | ORAL | Status: DC | PRN
  Filled 2023-01-04: qty 2

## 2023-01-04 MED ORDER — ONDANSETRON HCL 4 MG/2ML IJ SOLN: 4.0000 mg | Freq: Four times a day (QID) | INTRAMUSCULAR | Status: DC | PRN

## 2023-01-04 MED ORDER — ACETAMINOPHEN 500 MG PO TABS
1000.0000 mg | ORAL_TABLET | Freq: Once | ORAL | Status: DC
  Filled 2023-01-04: qty 2

## 2023-01-04 MED ORDER — PHENYLEPHRINE 80 MCG/ML (10ML) SYRINGE FOR IV PUSH (FOR BLOOD PRESSURE SUPPORT)
PREFILLED_SYRINGE | INTRAVENOUS | Status: DC | PRN
  Administered 2023-01-04: 80 ug via INTRAVENOUS

## 2023-01-04 MED ORDER — VITAMIN B-12 1000 MCG PO TABS
1000.0000 ug | ORAL_TABLET | Freq: Every day | ORAL | Status: DC
  Administered 2023-01-05 – 2023-01-06 (×2): 1000 ug via ORAL
  Filled 2023-01-04 (×2): qty 1

## 2023-01-04 MED ORDER — MORPHINE SULFATE (PF) 2 MG/ML IV SOLN: 2.0000 mg | INTRAVENOUS | Status: DC | PRN

## 2023-01-04 MED ORDER — PROPOFOL 10 MG/ML IV BOLUS
INTRAVENOUS | Status: DC | PRN
  Administered 2023-01-04: 100 mg via INTRAVENOUS
  Administered 2023-01-04 (×2): 50 mg via INTRAVENOUS

## 2023-01-04 MED ORDER — SODIUM CHLORIDE 0.9% FLUSH
3.0000 mL | Freq: Two times a day (BID) | INTRAVENOUS | Status: DC
  Administered 2023-01-04: 3 mL via INTRAVENOUS

## 2023-01-04 MED ORDER — LIDOCAINE-EPINEPHRINE 0.5 %-1:200000 IJ SOLN
INTRAMUSCULAR | Status: DC | PRN
  Administered 2023-01-04: 40 mL

## 2023-01-04 MED ORDER — THROMBIN 20000 UNITS EX SOLR
CUTANEOUS | Status: DC | PRN
  Administered 2023-01-04: 20 mL via TOPICAL

## 2023-01-04 MED ORDER — ACETAMINOPHEN 325 MG PO TABS
650.0000 mg | ORAL_TABLET | ORAL | Status: DC | PRN
  Administered 2023-01-06: 650 mg via ORAL
  Filled 2023-01-04: qty 2

## 2023-01-04 SURGICAL SUPPLY — 60 items
BAG COUNTER SPONGE SURGICOUNT (BAG) ×1 IMPLANT
BASKET BONE COLLECTION (BASKET) ×1 IMPLANT
BENZOIN TINCTURE PRP APPL 2/3 (GAUZE/BANDAGES/DRESSINGS) IMPLANT
BLADE BONE MILL MEDIUM (MISCELLANEOUS) ×1 IMPLANT
BLADE CLIPPER SURG (BLADE) IMPLANT
BLADE SURG 10 STRL SS (BLADE) IMPLANT
BUR MATCHSTICK NEURO 3.0 LAGG (BURR) ×1 IMPLANT
BUR PRECISION FLUTE 5.0 (BURR) ×1 IMPLANT
CANISTER SUCT 3000ML PPV (MISCELLANEOUS) ×1 IMPLANT
CNTNR URN SCR LID CUP LEK RST (MISCELLANEOUS) ×1 IMPLANT
CONT SPEC 4OZ STRL OR WHT (MISCELLANEOUS) ×1
COVER BACK TABLE 60X90IN (DRAPES) ×1 IMPLANT
DERMABOND ADVANCED .7 DNX12 (GAUZE/BANDAGES/DRESSINGS) ×1 IMPLANT
DRAPE C-ARM 42X72 X-RAY (DRAPES) ×2 IMPLANT
DRAPE C-ARMOR (DRAPES) IMPLANT
DRAPE LAPAROTOMY 100X72X124 (DRAPES) ×1 IMPLANT
DRAPE SURG 17X23 STRL (DRAPES) ×1 IMPLANT
DRSG OPSITE POSTOP 4X8 (GAUZE/BANDAGES/DRESSINGS) IMPLANT
DURAPREP 26ML APPLICATOR (WOUND CARE) ×1 IMPLANT
ELECT REM PT RETURN 9FT ADLT (ELECTROSURGICAL) ×1
ELECTRODE REM PT RTRN 9FT ADLT (ELECTROSURGICAL) ×1 IMPLANT
GAUZE 4X4 16PLY ~~LOC~~+RFID DBL (SPONGE) IMPLANT
GAUZE SPONGE 4X4 12PLY STRL (GAUZE/BANDAGES/DRESSINGS) IMPLANT
GLOVE EXAM NITRILE XL STR (GLOVE) IMPLANT
GLOVE SURG LTX SZ6.5 (GLOVE) ×2 IMPLANT
GOWN STRL REUS W/ TWL LRG LVL3 (GOWN DISPOSABLE) ×2 IMPLANT
GOWN STRL REUS W/ TWL XL LVL3 (GOWN DISPOSABLE) IMPLANT
GOWN STRL REUS W/TWL 2XL LVL3 (GOWN DISPOSABLE) IMPLANT
GOWN STRL REUS W/TWL LRG LVL3 (GOWN DISPOSABLE) ×4
GOWN STRL REUS W/TWL XL LVL3 (GOWN DISPOSABLE)
GUIDEWIRE NITINOL BEVEL TIP (WIRE) IMPLANT
KIT BASIN OR (CUSTOM PROCEDURE TRAY) ×1 IMPLANT
KIT POSITION SURG JACKSON T1 (MISCELLANEOUS) ×1 IMPLANT
KIT TURNOVER KIT B (KITS) ×1 IMPLANT
MILL BONE PREP (MISCELLANEOUS) IMPLANT
NDL HYPO 25X1 1.5 SAFETY (NEEDLE) ×1 IMPLANT
NDL SPNL 18GX3.5 QUINCKE PK (NEEDLE) IMPLANT
NEEDLE HYPO 25X1 1.5 SAFETY (NEEDLE) ×1 IMPLANT
NEEDLE SPNL 18GX3.5 QUINCKE PK (NEEDLE) IMPLANT
NS IRRIG 1000ML POUR BTL (IV SOLUTION) ×1 IMPLANT
PACK LAMINECTOMY NEURO (CUSTOM PROCEDURE TRAY) ×1 IMPLANT
PAD ARMBOARD 7.5X6 YLW CONV (MISCELLANEOUS) ×2 IMPLANT
ROD LORD RELINE 5.5X30 (Rod) IMPLANT
SCREW LOCK RELINE 5.5 TULIP (Screw) IMPLANT
SCREW MAS RELINE 6.5X45 POLY (Screw) IMPLANT
SCREW MAS RELINE POLY 6.5X40 (Screw) IMPLANT
SPIKE FLUID TRANSFER (MISCELLANEOUS) ×1 IMPLANT
SPONGE SURGIFOAM ABS GEL 100 (HEMOSTASIS) ×1 IMPLANT
SPONGE T-LAP 4X18 ~~LOC~~+RFID (SPONGE) IMPLANT
STRIP CLOSURE SKIN 1/2X4 (GAUZE/BANDAGES/DRESSINGS) IMPLANT
SUT PROLENE 6 0 BV (SUTURE) IMPLANT
SUT VIC AB 0 CT1 18XCR BRD8 (SUTURE) ×1 IMPLANT
SUT VIC AB 0 CT1 8-18 (SUTURE) ×1
SUT VIC AB 2-0 CT1 18 (SUTURE) ×1 IMPLANT
SUT VIC AB 3-0 SH 8-18 (SUTURE) ×1 IMPLANT
TOWEL GREEN STERILE (TOWEL DISPOSABLE) ×1 IMPLANT
TOWEL GREEN STERILE FF (TOWEL DISPOSABLE) ×1 IMPLANT
TRAY FOLEY MTR SLVR 14FR STAT (SET/KITS/TRAYS/PACK) IMPLANT
TRAY FOLEY MTR SLVR 16FR STAT (SET/KITS/TRAYS/PACK) ×1 IMPLANT
WATER STERILE IRR 1000ML POUR (IV SOLUTION) ×1 IMPLANT

## 2023-01-04 NOTE — Anesthesia Procedure Notes (Signed)
Procedure Name: Intubation Date/Time: 01/04/2023 2:20 PM  Performed by: Minerva Ends, CRNAPre-anesthesia Checklist: Patient identified, Emergency Drugs available, Suction available and Patient being monitored Patient Re-evaluated:Patient Re-evaluated prior to induction Oxygen Delivery Method: Circle system utilized Preoxygenation: Pre-oxygenation with 100% oxygen Induction Type: IV induction Ventilation: Mask ventilation without difficulty Laryngoscope Size: Mac and 3 Grade View: Grade III Tube type: Oral Tube size: 7.0 mm Number of attempts: 1 Airway Equipment and Method: Stylet and Oral airway Placement Confirmation: ETT inserted through vocal cords under direct vision, positive ETCO2 and breath sounds checked- equal and bilateral Secured at: 22 cm Tube secured with: Tape Dental Injury: Teeth and Oropharynx as per pre-operative assessment

## 2023-01-04 NOTE — Transfer of Care (Signed)
Immediate Anesthesia Transfer of Care Note  Patient: Casey [name]  Procedure(s) Performed: LUMBAR FOUR THROUGH FIVE POSTERIOR LUMBAR INTERBODY FUSION (Spine Lumbar)  Patient Location: PACU  Anesthesia Type:General  Level of Consciousness: awake, alert , and oriented  Airway & Oxygen Therapy: Patient Spontanous Breathing and Patient connected to face mask oxygen  Post-op Assessment: Report given to RN and Post -op Vital signs reviewed and stable  Post vital signs: Reviewed and stable  Last Vitals:  Vitals Value Taken Time  BP 162/79 01/04/23 1816  Temp    Pulse 98 01/04/23 1820  Resp 13 01/04/23 1820  SpO2 100 % 01/04/23 1820  Vitals shown include unvalidated device data.  Last Pain:  Vitals:   01/04/23 1218  TempSrc:   PainSc: 9          Complications: No notable events documented.

## 2023-01-04 NOTE — Anesthesia Preprocedure Evaluation (Addendum)
Anesthesia Evaluation  Patient identified by MRN, date of birth, ID band Patient awake    Reviewed: Allergy & Precautions, NPO status , Patient's Chart, lab work & pertinent test results  Airway Mallampati: II  TM Distance: >3 FB Neck ROM: Limited    Dental  (+) Dental Advisory Given, Chipped,    Pulmonary neg pulmonary ROS   Pulmonary exam normal breath sounds clear to auscultation       Cardiovascular negative cardio ROS Normal cardiovascular exam Rhythm:Regular Rate:Normal     Neuro/Psych Lumbar stenosis with neurogenic claudication  Neuromuscular disease    GI/Hepatic negative GI ROS, Neg liver ROS,,,  Endo/Other  negative endocrine ROS    Renal/GU negative Renal ROS     Musculoskeletal  (+) Arthritis , Rheumatoid disorders,    Abdominal   Peds  Hematology negative hematology ROS (+)   Anesthesia Other Findings Day of surgery medications reviewed with the patient.  Reproductive/Obstetrics                             Anesthesia Physical Anesthesia Plan  ASA: 2  Anesthesia Plan: General   Post-op Pain Management: Tylenol PO (pre-op)*   Induction: Intravenous  PONV Risk Score and Plan: 3 and Dexamethasone and Ondansetron  Airway Management Planned: Oral ETT and Video Laryngoscope Planned  Additional Equipment:   Intra-op Plan:   Post-operative Plan: Extubation in OR  Informed Consent: I have reviewed the patients History and Physical, chart, labs and discussed the procedure including the risks, benefits and alternatives for the proposed anesthesia with the patient or authorized representative who has indicated his/her understanding and acceptance.     Dental advisory given  Plan Discussed with: CRNA  Anesthesia Plan Comments: (2nd PIV after induction )        Anesthesia Quick Evaluation

## 2023-01-04 NOTE — Op Note (Signed)
01/04/2023  6:53 PM  PATIENT:  Casey [name]  77 y.o. female Is status post a previous operation at L4/5 performed at an outside facility. She complains of pain in the lower extremities, and a myelogram and post myelogram CT revealed significant remaining stenosis at L4/5. She had previously had an Xlif, and percutaneous screw placement . No actual bony decompression was performed at that time. 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 L4 laminectomy, inferior facetectomies Exploration of fusion L4/5 Posterolateral arthrodesis L4/5 with autograft morsels Removal and subsequent replacement left L4, and L5 screws  SURGEON:  Surgeon(s): Ashok Pall, MD  ASSISTANTS:none  ANESTHESIA:   general  EBL:  Total I/O In: 1250 [I.V.:1000; IV Piggyback:250] Out: 500 [Urine:300; Blood:200]  BLOOD ADMINISTERED:none  CELL SAVER GIVEN:not used  COUNT:per nursing  DRAINS: none   SPECIMEN:  No Specimen  DICTATION: Casey [name] is a 77 y.o. female whom was taken to the operating room intubated, and placed under a general anesthetic without difficulty. A foley catheter was placed under sterile conditions. She was positioned prone on a Jackson table with all pressure points properly padded.  Her lumbar region was prepped and draped in a sterile manner. I infiltrated 20cc's 1/2%lidocaine/1:2000,000 strength epinephrine into the planned incision. I opened the skin with a 10 blade and took the incision down to the thoracolumbar fascia. I exposed the lamina of L4 and L5 in a subperiosteal fashion bilaterally. I confirmed my location with an intraoperative xray.  I placed self retaining retractors and started the decompression.  I decompressed the spinal canal via a near complete L4  laminectomy, inferior L4 facetectomies, partial superior L5 facetectomies using the drill and Kerrison punches. Once satisfied with the decompression I explored the arthrodesis.  I detected movement at the L4/5 disc space confirming the psuedoarthrosis.  I decorticated the lateral bone at L4 and 5. I then placed autograft morsels on the decorticated surfaces to complete the posterolateral arthrodesis.  I first removed the left L4, and 5 screws, along with the caps and rod. I replaced pedicle screws at L4 and L5 using 7.24mm cannulated nuvasive screws, using fluoroscopic guidance.  I attached the rod and locking caps with the appropriate tools. The locking caps were secured with torque limited screwdrivers. Final films were performed and the final construct appeared to be in good position.  I closed the wound in a layered fashion. I approximated the thoracolumbar fascia, subcutaneous, and subcuticular planes with vicryl sutures. I used a honeycomb dressing for a sterile dressing.     PLAN OF CARE: Admit to inpatient   PATIENT DISPOSITION:  PACU - hemodynamically stable.   Delay start of Pharmacological VTE agent (>24hrs) due to surgical blood loss or risk of bleeding:  yes

## 2023-01-04 NOTE — H&P (Signed)
BP (!) 173/95   Pulse 75   Temp 97.8 F (36.6 C) (Oral)   Resp 18   Ht 5\' 4"  (1.626 m)   Wt 64.4 kg   SpO2 96%   BMI 24.37 kg/m  Casey [name] comes in today having had surgery in 2021 done by Dr. Meade Maw at [facility].  He did an interbody arthrodesis via posterolateral approach and put in pedicle screws posteriorly.  She was stenotic on the left side, which is fairly severe.  She also had a problem at L2-3, if I am not mistaken, and that was a disc and severe stenosis on the right side in the foramen.  She comes in today complaining of pain which is severe, 10/10 in the lower back.  She has been treated by a pain specialist over the last 2 years and the pain specialist said, "I think you need to go back to see your primary care physician."  The primary care physician then said, "I'm going to send you to a neurosurgeon."  A new MRI was performed in May of this year, and what it shows is that she still has the foraminal narrowing at the level that was operated on, 4-5, and at the level that was not operated on at 2-3.     On exam today, she is alert, oriented x4.  She has full strength in the lower extremities.  2+ reflexes at the knees, 1+ at the ankles.  Normal muscle tone, bulk, coordination.  Gait is normal.  Memory, language, attention span, fund of knowledge are normal.  Pupils equal, round, react to light.  Full extraocular movements.  Full visual fields.  Hearing intact to voice.  Uvula elevates in midline.  Shoulder shrug is normal.  She weighs 142 pounds.  Temperature is 98.3, blood pressure 152/78, pulse 99.  Pain is 10/10.    [name] underwent a PLIF in the recent past and said she never felt any better.  She had an MRI which shows stenosis at that level, but I wanted a more detailed image so I did a myelogram and post myelogram CT.  She had a lateral cage placed at 4-5 and she had percutaneous screws placed also, but she is not decompressed. She is still stenotic.  Severe foraminal narrowing, severe lateral recess stenosis and she has yet to fully be arthrodesed and this was approximately 2 years ago.       I believe she needs to be decompressed.  She has good old-fashioned neurogenic claudication secondary to lumbar stenosis.  I would replace the screw on the left side at L5, as it looks like it has moved inside the bone and created a space around it.  The screws at L4 appear to be just fine.     So that is the plan.  We will do a lumbar decompression and arthrodesis at E3-1, since she certainly has a pseudoarthrosis there.  The risks and benefits she is well aware of, having had the operation. Bleeding, infection, no pain relief.

## 2023-01-05 DIAGNOSIS — M48062 Spinal stenosis, lumbar region with neurogenic claudication: Secondary | ICD-10-CM | POA: Diagnosis not present

## 2023-01-05 DIAGNOSIS — M960 Pseudarthrosis after fusion or arthrodesis: Secondary | ICD-10-CM | POA: Diagnosis not present

## 2023-01-05 NOTE — Evaluation (Signed)
Occupational Therapy Evaluation Patient Details Name: Casey [name] MRN: [id number] DOB: [birth year] Today's Date: 01/05/2023   History of Present Illness Pt is a 77 y/o female who presents s/p L4-L5 PLIF on 01/04/2023. PMH significant for Herpes zoster, osteoporosis, RA, ALIF 2021, lumbar laminectomy 2021, neck surgery, R RCR.   Clinical Impression   PTA patient independent with ADLs, mobility and light IADLs.  Pt reports granddaughter assists as needed and will provide support at dc.  She was admitted for above and presents with problem list below.  She currently requires min guard for transfers and mobility in room given hand held assist, she is completing ADLs with setup/supervision to mod assist.  Educated on compensatory techniques for ADLs, back precautions, recommendations, DME and safety.  Pt reports granddaughter will assist with LB ADLs at this time.  Pt plans to self purchase shower seat for shower.  Based of performance today, pt will benefit from continued OT services acutely to optimize independence, safety and return to PLOF. Anticipate no further needs after dc home.       Recommendations for follow up therapy are one component of a multi-disciplinary discharge planning process, led by the attending physician.  Recommendations may be updated based on patient status, additional functional criteria and insurance authorization.   Follow Up Recommendations  No OT follow up     Assistance Recommended at Discharge Frequent or constant Supervision/Assistance  Patient can return home with the following A little help with walking and/or transfers;A lot of help with bathing/dressing/bathroom;Assistance with cooking/housework;Assist for transportation;Help with stairs or ramp for entrance    Functional Status Assessment  Patient has had a recent decline in their functional status and demonstrates the ability to make significant improvements in function in a reasonable and predictable amount  of time.  Equipment Recommendations  Tub/shower seat    Recommendations for Other Services       Precautions / Restrictions Precautions Precautions: Fall;Back Precaution Booklet Issued: Yes (comment) Precaution Comments: Reviewed handout and pt was cued for precautions during functional mobility. Other Brace: No brace needed order Restrictions Weight Bearing Restrictions: No      Mobility Bed Mobility               General bed mobility comments: OOB in recliner upon entry    Transfers Overall transfer level: Needs assistance Equipment used: 1 person hand held assist Transfers: Sit to/from Stand Sit to Stand: Min guard           General transfer comment: for safety, cueing for posture      Balance Overall balance assessment: Needs assistance Sitting-balance support: No upper extremity supported, Feet supported Sitting balance-Leahy Scale: Fair     Standing balance support: No upper extremity supported, During functional activity, Single extremity supported Standing balance-Leahy Scale: Fair Standing balance comment: preference to 1 UE support                           ADL either performed or assessed with clinical judgement   ADL Overall ADL's : Needs assistance/impaired     Grooming: Supervision/safety;Oral care;Standing           Upper Body Dressing : Supervision/safety;Set up;Sitting   Lower Body Dressing: Moderate assistance;Sit to/from stand;Cueing for compensatory techniques Lower Body Dressing Details (indicate cue type and reason): pt unable to complete figure 4 technique,  reports granddaughter can assist at [employer] Transfer: Min guard;Ambulation Toilet Transfer Details (indicate cue type and reason): hand  held assist [place] and Hygiene: Min guard;Sit to/from stand   Tub/ Shower Transfer: Min guard;Walk-in shower;Ambulation;Shower Scientist, research (medical) Details (indicate cue type and reason):  simulated in room Functional mobility during ADLs: Min guard;Cueing for safety       Vision   Vision Assessment?: No apparent visual deficits     Perception     Praxis      Pertinent Vitals/Pain Pain Assessment Pain Assessment: Faces Faces Pain Scale: Hurts a little bit Pain Location: back, incisional Pain Descriptors / Indicators: Discomfort, Operative site guarding Pain Intervention(s): Limited activity within patient's tolerance, Monitored during session, Repositioned     Hand Dominance Right   Extremity/Trunk Assessment Upper Extremity Assessment Upper Extremity Assessment: Generalized weakness   Lower Extremity Assessment Lower Extremity Assessment: Defer to PT evaluation   Cervical / Trunk Assessment Cervical / Trunk Assessment: Back Surgery   Communication Communication Communication: No difficulties   Cognition Arousal/Alertness: Awake/alert Behavior During Therapy: Flat affect Overall Cognitive Status: No family/caregiver present to determine baseline cognitive functioning                                 General Comments: patient oriented and follows commands with increased time. some slow processing,  decreased recall of back precautions and requires cueing to adhere functionally.     General Comments       Exercises     Shoulder Instructions      Home Living Family/patient expects to be discharged to:: Private residence Living Arrangements: Other relatives (Granddaughter) Available Help at Discharge: Family;Available 24 hours/day Type of Home: House Home Access: Ramped entrance     Home Layout: One level     Bathroom Shower/Tub: Occupational psychologist: Standard     Home Equipment: None          Prior Functioning/Environment Prior Level of Function : Independent/Modified Independent             Mobility Comments: no AD ADLs Comments: reports independent with ADLs, light IADLs        OT Problem  List: Decreased strength;Decreased activity tolerance;Impaired balance (sitting and/or standing);Decreased knowledge of use of DME or AE;Decreased knowledge of precautions;Pain      OT Treatment/Interventions: Self-care/ADL training;Therapeutic exercise;DME and/or AE instruction;Therapeutic activities;Patient/family education;Balance training    OT Goals(Current goals can be found in the care plan section) Acute Rehab OT Goals Patient Stated Goal: home OT Goal Formulation: With patient Time For Goal Achievement: 01/19/23 Potential to Achieve Goals: Good  OT Frequency: Min 2X/week    Co-evaluation              AM-PAC OT "6 Clicks" Daily Activity     Outcome Measure Help from another person eating meals?: None Help from another person taking care of personal grooming?: A Little Help from another person toileting, which includes using toliet, bedpan, or urinal?: A Little Help from another person bathing (including washing, rinsing, drying)?: A Lot Help from another person to put on and taking off regular upper body clothing?: A Little Help from another person to put on and taking off regular lower body clothing?: A Lot 6 Click Score: 17   End of Session Nurse Communication: Mobility status;Other (comment) (DME recs)  Activity Tolerance: Patient tolerated treatment well Patient left: in chair;with call bell/phone within reach  OT Visit Diagnosis: Other abnormalities of gait and mobility (R26.89);Muscle weakness (generalized) (M62.81);Pain Pain - part of body:  (  back-incisional)                Time: 8016-5537 OT Time Calculation (min): 18 min Charges:  OT General Charges $OT Visit: 1 Visit OT Evaluation $OT Eval Low Complexity: 1 Low  Jolaine Artist, OT Acute Rehabilitation Services Office [phone]   Delight Stare 01/05/2023, 12:04 PM

## 2023-01-05 NOTE — Progress Notes (Signed)
Patient ID: Casey [name], female   DOB: [birth year], 77 y.o.   MRN: [id number] BP 123/64 (BP Location: Left Arm)   Pulse 85   Temp 97.9 F (36.6 C) (Oral)   Resp 18   Ht 5\' 4"  (1.626 m)   Wt 64.4 kg   SpO2 100%   BMI 24.37 kg/m  Alert, oriented x 4, speech is clear and fluent Moving all extremities Probable discharge tomorrow

## 2023-01-05 NOTE — Anesthesia Postprocedure Evaluation (Signed)
Anesthesia Post Note  Patient: Casey [name]  Procedure(s) Performed: LUMBAR FOUR THROUGH FIVE POSTERIOR LUMBAR INTERBODY FUSION (Spine Lumbar)     Patient location during evaluation: PACU Anesthesia Type: General Level of consciousness: sedated and patient cooperative Pain management: pain level controlled Vital Signs Assessment: post-procedure vital signs reviewed and stable Respiratory status: spontaneous breathing Cardiovascular status: stable Anesthetic complications: no   No notable events documented.  Last Vitals:  Vitals:   01/05/23 1618 01/05/23 1959  BP: (!) 125/54 123/64  Pulse: 73 85  Resp: 14 18  Temp: 36.9 C 36.6 C  SpO2: 100% 100%    Last Pain:  Vitals:   01/05/23 1959  TempSrc: Oral  PainSc:                  Nolon Nations

## 2023-01-05 NOTE — Evaluation (Signed)
Physical Therapy Evaluation  Patient Details Name: Casey [name] MRN: [id number] DOB: [birth year] Today's Date: 01/05/2023  History of Present Illness  Casey [name] is a 77 y/o female who presents s/p L4-L5 PLIF on 01/04/2023. PMH significant for Herpes zoster, osteoporosis, RA, ALIF 2021, lumbar laminectomy 2021, neck surgery, R RCR.   Clinical Impression  Casey [name] admitted with above diagnosis. At the time of Casey [name] eval, Casey [name] was able to demonstrate transfers and ambulation with gross min guard assist to min assist and HHA for support. Casey [name] was educated on precautions, positioning recommendations, appropriate activity progression, and car transfer. Casey [name] currently with functional limitations due to the deficits listed below (see Casey [name] Problem List). Casey [name] will benefit from skilled Casey [name] to increase their independence and safety with mobility to allow discharge to the venue listed below.         Recommendations for follow up therapy are one component of a multi-disciplinary discharge planning process, led by the attending physician.  Recommendations may be updated based on patient status, additional functional criteria and insurance authorization.  Follow Up Recommendations No Casey [name] follow up      Assistance Recommended at Discharge Frequent or constant Supervision/Assistance  Patient can return home with the following  A little help with walking and/or transfers;A little help with bathing/dressing/bathroom;Assistance with cooking/housework;Assist for transportation    Equipment Recommendations None recommended by Casey [name]  Recommendations for Other Services       Functional Status Assessment Patient has had a recent decline in their functional status and demonstrates the ability to make significant improvements in function in a reasonable and predictable amount of time.     Precautions / Restrictions Precautions Precautions: Fall;Back Precaution Booklet Issued: Yes (comment) Precaution Comments: Reviewed handout and Casey [name] was cued  for precautions during functional mobility. Other Brace: No brace needed order Restrictions Weight Bearing Restrictions: No      Mobility  Bed Mobility Overal bed mobility: Needs Assistance Bed Mobility: Rolling, Sidelying to Sit Rolling: Min guard Sidelying to sit: Min assist       General bed mobility comments: Hands on guarding and assist throughout log roll for optimal technique.    Transfers Overall transfer level: Needs assistance Equipment used: 1 person hand held assist Transfers: Sit to/from Stand Sit to Stand: Min guard           General transfer comment: VC's for hand placement on seated surface for safety. Hands on guarding for safety.    Ambulation/Gait Ambulation/Gait assistance: Min assist Gait Distance (Feet): 300 Feet Assistive device: 1 person hand held assist Gait Pattern/deviations: Step-through pattern, Decreased stride length, Trunk flexed, Staggering left, Staggering right Gait velocity: Decreased Gait velocity interpretation: <1.31 ft/sec, indicative of household ambulator   General Gait Details: VC's for improved posture throughout. HHA for balance support.  Stairs            Wheelchair Mobility    Modified Rankin (Stroke Patients Only)       Balance Overall balance assessment: Needs assistance Sitting-balance support: No upper extremity supported, Feet supported Sitting balance-Leahy Scale: Fair     Standing balance support: No upper extremity supported, During functional activity, Single extremity supported Standing balance-Leahy Scale: Fair Standing balance comment: preference to 1 UE support                             Pertinent Vitals/Pain Pain Assessment Pain Assessment: Faces Faces Pain Scale: Hurts a little bit Pain Location: back, incisional Pain Descriptors /  Indicators: Discomfort, Operative site guarding Pain Intervention(s): Limited activity within patient's tolerance, Monitored during session,  Repositioned    Home Living Family/patient expects to be discharged to:: Private residence Living Arrangements: Other relatives (Granddaughter) Available Help at Discharge: Family;Available 24 hours/day Type of Home: House Home Access: Ramped entrance       Home Layout: One level Home Equipment: None      Prior Function Prior Level of Function : Independent/Modified Independent             Mobility Comments: no AD ADLs Comments: reports independent with ADLs, light IADLs     Hand Dominance   Dominant Hand: Right    Extremity/Trunk Assessment   Upper Extremity Assessment Upper Extremity Assessment: Generalized weakness    Lower Extremity Assessment Lower Extremity Assessment: Generalized weakness    Cervical / Trunk Assessment Cervical / Trunk Assessment: Back Surgery  Communication   Communication: No difficulties  Cognition Arousal/Alertness: Awake/alert Behavior During Therapy: Flat affect Overall Cognitive Status: No family/caregiver present to determine baseline cognitive functioning                                 General Comments: patient oriented and follows commands with increased time. some slow processing,  decreased recall of back precautions and requires cueing to adhere functionally.        General Comments      Exercises     Assessment/Plan    Casey [name] Assessment Patient needs continued Casey [name] services  Casey [name] Problem List Decreased strength;Decreased activity tolerance;Decreased balance;Decreased mobility;Decreased knowledge of use of DME;Decreased knowledge of precautions;Decreased safety awareness;Pain       Casey [name] Treatment Interventions DME instruction;Gait training;Stair training;Functional mobility training;Therapeutic activities;Therapeutic exercise;Balance training;Cognitive remediation;Patient/family education    Casey [name] Goals (Current goals can be found in the Care Plan section)  Acute Rehab Casey [name] Goals Patient Stated Goal: Home  today Casey [name] Goal Formulation: With patient Time For Goal Achievement: 01/12/23 Potential to Achieve Goals: Good    Frequency Min 5X/week     Co-evaluation               AM-PAC Casey [name] "6 Clicks" Mobility  Outcome Measure Help needed turning from your back to your side while in a flat bed without using bedrails?: A Little Help needed moving from lying on your back to sitting on the side of a flat bed without using bedrails?: A Little Help needed moving to and from a bed to a chair (including a wheelchair)?: A Little Help needed standing up from a chair using your arms (e.g., wheelchair or bedside chair)?: A Little Help needed to walk in hospital room?: A Little Help needed climbing 3-5 steps with a railing? : A Little 6 Click Score: 18    End of Session Equipment Utilized During Treatment: Gait belt Activity Tolerance: Patient tolerated treatment well Patient left: in chair;with call bell/phone within reach Nurse Communication: Mobility status Casey [name] Visit Diagnosis: Unsteadiness on feet (R26.81);Pain Pain - part of body:  (back)    Time: 1194-1740 Casey [name] Time Calculation (min) (ACUTE ONLY): 31 min   Charges:   Casey [name] Evaluation $Casey [name] Eval Low Complexity: 1 Low Casey [name] Treatments $Gait Training: 8-22 mins        Rolinda Roan, Casey [name], DPT Acute Rehabilitation Services Secure Chat Preferred Office: [phone]   Thelma Comp 01/05/2023, 12:53 PM

## 2023-01-06 DIAGNOSIS — M48062 Spinal stenosis, lumbar region with neurogenic claudication: Secondary | ICD-10-CM | POA: Diagnosis not present

## 2023-01-06 DIAGNOSIS — M960 Pseudarthrosis after fusion or arthrodesis: Secondary | ICD-10-CM | POA: Diagnosis not present

## 2023-01-06 MED ORDER — TIZANIDINE HCL 4 MG PO TABS: 4.0000 mg | ORAL_TABLET | Freq: Four times a day (QID) | ORAL | 0 refills | Status: DC | PRN

## 2023-01-06 MED ORDER — OXYCODONE HCL 5 MG PO TABS: 5.0000 mg | ORAL_TABLET | Freq: Four times a day (QID) | ORAL | 0 refills | Status: AC | PRN

## 2023-01-06 NOTE — Progress Notes (Signed)
Patient alert and oriented, mae's well, voiding adequate amount of urine, swallowing without difficulty, no c/o pain at time of discharge. Patient discharged home with family. Script and discharged instructions given to patient. Patient and family stated understanding of instructions given. Patient has an appointment with Dr. Christella Noa

## 2023-01-06 NOTE — Progress Notes (Signed)
Physical Therapy Treatment Patient Details Name: Casey [name] MRN: [id number] DOB: 02-15-1946 Today's Date: 01/06/2023   History of Present Illness Pt is a 77 y/o female who presents s/p L4-L5 PLIF on 01/04/2023. PMH significant for Herpes zoster, osteoporosis, RA, ALIF 2021, lumbar laminectomy 2021, neck surgery, R RCR.    PT Comments    Patient progressing well towards PT goals. Session focused on gait training with use of RW and functional mobility. Able to verbally recall 3/3 back precautions. Requires Min guard assist for transfers and gait training and balance improved with use of RW for support. Encouraged use of RW for initial use at home for safety. Discussed brace can be used for comfort for mobility. Continues to have slow processing but asking appropriate questions. Reviewed car transfer technique, log roll, activity recommendations, walking program, positioning etc. Will continue to follow and progress as tolerated.   Recommendations for follow up therapy are one component of a multi-disciplinary discharge planning process, led by the attending physician.  Recommendations may be updated based on patient status, additional functional criteria and insurance authorization.  Follow Up Recommendations  No PT follow up     Assistance Recommended at Discharge Frequent or constant Supervision/Assistance  Patient can return home with the following A little help with walking and/or transfers;A little help with bathing/dressing/bathroom;Assistance with cooking/housework;Assist for transportation   Equipment Recommendations  None recommended by PT    Recommendations for Other Services       Precautions / Restrictions Precautions Precautions: Fall;Back Precaution Booklet Issued: Yes (comment) Precaution Comments: ABle to recall 3/3 precautions Other Brace: No brace needed order Restrictions Weight Bearing Restrictions: No     Mobility  Bed Mobility               General  bed mobility comments: Sitting EOB upon PT Arrival.  Verbally reviewed log roll techinque.    Transfers Overall transfer level: Needs assistance Equipment used: None Transfers: Sit to/from Stand Sit to Stand: Min guard           General transfer comment: Min guard for safety, cues for hand placement/technique.    Ambulation/Gait Ambulation/Gait assistance: Min guard Gait Distance (Feet): 320 Feet Assistive device: Rolling walker (2 wheels) Gait Pattern/deviations: Step-through pattern, Decreased stride length, Trunk flexed, Staggering left, Staggering right Gait velocity: Decreased Gait velocity interpretation: <1.31 ft/sec, indicative of household ambulator   General Gait Details: Slow, mildly unsteady gait but improved with use of RW, soreness in back and reports legs feel heavy.   Stairs             Wheelchair Mobility    Modified Rankin (Stroke Patients Only)       Balance Overall balance assessment: Needs assistance Sitting-balance support: No upper extremity supported, Feet supported Sitting balance-Leahy Scale: Good     Standing balance support: No upper extremity supported, During functional activity, Single extremity supported Standing balance-Leahy Scale: Fair Standing balance comment: Does better with UE support for dynamic tasks                            Cognition Arousal/Alertness: Awake/alert Behavior During Therapy: Flat affect Overall Cognitive Status: No family/caregiver present to determine baseline cognitive functioning                                 General Comments: Slow processing. Able to recall precautions today.  Exercises      General Comments General comments (skin integrity, edema, etc.): Mild weeping at bandage site      Pertinent Vitals/Pain Pain Assessment Pain Assessment: Faces Faces Pain Scale: Hurts little more Pain Location: back, incisional Pain Descriptors / Indicators:  Discomfort, Operative site guarding Pain Intervention(s): Monitored during session, Repositioned, Limited activity within patient's tolerance    Home Living                          Prior Function            PT Goals (current goals can now be found in the care plan section) Progress towards PT goals: Progressing toward goals    Frequency    Min 5X/week      PT Plan Current plan remains appropriate    Co-evaluation              AM-PAC PT "6 Clicks" Mobility   Outcome Measure  Help needed turning from your back to your side while in a flat bed without using bedrails?: A Little Help needed moving from lying on your back to sitting on the side of a flat bed without using bedrails?: A Little Help needed moving to and from a bed to a chair (including a wheelchair)?: A Little Help needed standing up from a chair using your arms (e.g., wheelchair or bedside chair)?: A Little Help needed to walk in hospital room?: A Little Help needed climbing 3-5 steps with a railing? : A Little 6 Click Score: 18    End of Session Equipment Utilized During Treatment: Gait belt Activity Tolerance: Patient tolerated treatment well Patient left: in bed;with call bell/phone within reach (sitting EOB with OT) Nurse Communication: Mobility status PT Visit Diagnosis: Unsteadiness on feet (R26.81);Pain Pain - part of body:  (back)     Time: [id number] PT Time Calculation (min) (ACUTE ONLY): 11 min  Charges:  $Gait Training: 8-22 mins                     Marisa Severin, PT, DPT Acute Rehabilitation Services Secure chat preferred Office [place] 01/06/2023, 9:17 AM

## 2023-01-06 NOTE — Discharge Instructions (Addendum)
  Wound Care Remove dressing in 3 days Leave incision open to air. You may shower. Do not scrub directly on incision.  Do not put any creams, lotions, or ointments on incision. Activity Walk each and every day, increasing distance each day. No lifting greater than 5 lbs.  Avoid bending, arching, and twisting. No driving for 2 weeks; may ride as a passenger locally. If provided with back brace, wear when out of bed.  It is not necessary to wear in bed. Diet Resume your normal diet.   Call Your Doctor If Any of These Occur Redness, drainage, or swelling at the wound.  Temperature greater than 101 degrees. Severe pain not relieved by pain medication. Incision starts to come apart. Follow Up Appt Call  [phone]) for problems.  If you have any hardware placed in your spine, you will need an x-ray before your appointment.         Spinal Fusion Care After Refer to this sheet in the next few weeks. These instructions provide you with information on caring for yourself after your procedure. Your caregiver may also give you more specific instructions. Your treatment has been planned according to current medical practices, but problems sometimes occur. Call your caregiver if you have any problems or questions after your procedure. HOME CARE INSTRUCTIONS  Take whatever pain medicine has been prescribed by your caregiver. Do not take over-the-counter pain medicine unless directed otherwise by your caregiver.  Do not drive if you are taking narcotic pain medicines.  Change your bandage (dressing) if necessary or as directed by your caregiver.  You may shower. The wound may get wet, simply pat the area dry. It will take ~2 weeks for the glue to peel off. If you have been prescribed medicine to prevent your blood from clotting, follow the directions carefully.  Check the area around your incision often. Look for redness and swelling. Also, look for anything leaking from your wound. You can use  a mirror or have a family member inspect your incision if it is in a place where it is difficult for you to see.  Ask your caregiver what activities you should avoid and for how long.  Walk as much as possible.  Do not lift anything heavier than 5 lbs until your caregiver says it is safe.  Do not twist or bend for a few weeks. Try not to pull on things. Avoid sitting for long periods of time. Change positions at least every hour.

## 2023-01-06 NOTE — Progress Notes (Signed)
Occupational Therapy Treatment Patient Details Name: Casey [name] MRN: [id number] DOB: [birth year] Today's Date: 01/06/2023   History of present illness Pt is a 77 y/o female who presents s/p L4-L5 PLIF on 01/04/2023. PMH significant for Herpes zoster, osteoporosis, RA, ALIF 2021, lumbar laminectomy 2021, neck surgery, R RCR.   OT comments  Patient progressing well towards OT goals. Educated on LB AE today, pt able to use reacher to don LB clothing with min assist but reports preference to slip on shoes or having granddaughter assist with socks.  Discussed recommendations for DME, continue to recommend shower chair and BSC for independence independence.  She is using RW for functional mobility with supervision, but requires cueing for safety with transfers.  She recalls and adheres to precautions today without cueing.  Will follow acutely.    Recommendations for follow up therapy are one component of a multi-disciplinary discharge planning process, led by the attending physician.  Recommendations may be updated based on patient status, additional functional criteria and insurance authorization.    Follow Up Recommendations  No OT follow up     Assistance Recommended at Discharge Frequent or constant Supervision/Assistance  Patient can return home with the following  A little help with walking and/or transfers;A lot of help with bathing/dressing/bathroom;Assistance with cooking/housework;Assist for transportation;Help with stairs or ramp for entrance   Equipment Recommendations  BSC/3in1;Tub/shower seat    Recommendations for Other Services      Precautions / Restrictions Precautions Precautions: Fall;Back Precaution Booklet Issued: Yes (comment) Precaution Comments: ABle to recall 3/3 precautions Other Brace: No brace needed order Restrictions Weight Bearing Restrictions: No       Mobility Bed Mobility Overal bed mobility: Needs Assistance Bed Mobility: Sit to Sidelying,  Rolling Rolling: Min guard       Sit to sidelying: Min guard General bed mobility comments: increased time and cueing for technique    Transfers Overall transfer level: Needs assistance Equipment used: None Transfers: Sit to/from Stand Sit to Stand: Supervision           General transfer comment: safety, cueing for hand placement     Balance Overall balance assessment: Needs assistance Sitting-balance support: No upper extremity supported, Feet supported Sitting balance-Leahy Scale: Good     Standing balance support: Bilateral upper extremity supported, No upper extremity supported, During functional activity Standing balance-Leahy Scale: Fair                             ADL either performed or assessed with clinical judgement   ADL Overall ADL's : Needs assistance/impaired     Grooming: Supervision/safety;Oral care;Standing           Upper Body Dressing : Supervision/safety;Set up;Sitting   Lower Body Dressing: Minimal assistance;Sit to/from stand;With adaptive equipment Lower Body Dressing Details (indicate cue type and reason): using reacher to don underwear and pants with min assist, would need assist for socks and plans to have granddaughter assist Toilet Transfer: Supervision/safety;Ambulation;Rollator (4 wheels) Toilet Transfer Details (indicate cue type and reason): relies on BUE support to power up Reform and Hygiene: Supervision/safety;Sitting/lateral lean;Cueing for compensatory techniques;Cueing for back precautions Toileting - Clothing Manipulation Details (indicate cue type and reason): educated on compensatory techniques     Functional mobility during ADLs: Supervision/safety;Rolling walker (2 wheels)      Extremity/Trunk Assessment              Vision       Perception  Praxis      Cognition Arousal/Alertness: Awake/alert Behavior During Therapy: Flat affect Overall Cognitive Status: No  family/caregiver present to determine baseline cognitive functioning                                 General Comments: able to recall precautions, slow processing and decreased problem solving noted.        Exercises      Shoulder Instructions       General Comments Mild weeping at bandage site    Pertinent Vitals/ Pain       Pain Assessment Pain Assessment: No/denies pain Faces Pain Scale: Hurts a little bit Pain Location: back, incisional Pain Descriptors / Indicators: Discomfort, Operative site guarding Pain Intervention(s): Limited activity within patient's tolerance, Monitored during session, Repositioned  Home Living                                          Prior Functioning/Environment              Frequency  Min 2X/week        Progress Toward Goals  OT Goals(current goals can now be found in the care plan section)  Progress towards OT goals: Progressing toward goals  Acute Rehab OT Goals Patient Stated Goal: home OT Goal Formulation: With patient Time For Goal Achievement: 01/19/23 Potential to Achieve Goals: Good  Plan Discharge plan remains appropriate;Frequency remains appropriate    Co-evaluation                 AM-PAC OT "6 Clicks" Daily Activity     Outcome Measure   Help from another person eating meals?: None Help from another person taking care of personal grooming?: A Little Help from another person toileting, which includes using toliet, bedpan, or urinal?: A Little Help from another person bathing (including washing, rinsing, drying)?: A Little Help from another person to put on and taking off regular upper body clothing?: A Little Help from another person to put on and taking off regular lower body clothing?: A Little 6 Click Score: 19    End of Session Equipment Utilized During Treatment: Rolling walker (2 wheels)  OT Visit Diagnosis: Other abnormalities of gait and mobility  (R26.89);Muscle weakness (generalized) (M62.81);Pain Pain - part of body:  (back)   Activity Tolerance Patient tolerated treatment well   Patient Left in bed;with call bell/phone within reach   Nurse Communication Mobility status        Time: [id number] OT Time Calculation (min): 23 min  Charges: OT General Charges $OT Visit: 1 Visit OT Treatments $Self Care/Home Management : 23-37 mins  [place] Office [phone]   Delight Stare 01/06/2023, 11:25 AM

## 2023-01-06 NOTE — Discharge Summary (Signed)
Physician Discharge Summary  Patient ID: Casey [name] MRN: [id number] DOB/AGE: 06/01/1946 77 y.o.  Admit date: 01/04/2023 Discharge date: 01/06/2023  Admission Diagnoses:lumbar stenosis, lumbar 4/5.  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 I replaced the screws at 4, and 5 on the left sides. Performed a posterolateral arthrodesis at L4/5 Post op she was ambulating, voiding, and tolerating a regular diet.   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 L4 laminectomy, inferior facetectomies Exploration of fusion L4/5 Posterolateral arthrodesis L4/5 with autograft morsels Removal and subsequent replacement left L4, and L5 screws  Discharge Exam: Blood pressure 103/66, pulse (!) 109, temperature 99.3 F (37.4 C), temperature source Oral, resp. rate 17, height 5' 4"$  (1.626 m), weight 64.4 kg, SpO2 98 %. General appearance: alert, cooperative, appears stated age, and no distressP Disposition: Discharge disposition: 01-Home or Self Care      Lumbar stenosis with neurogenic claudication  Allergies as of 01/06/2023       Reactions   Leflunomide Other (See Comments)   GI upset   Methotrexate Other (See Comments)   GI upset        Medication List     TAKE these medications    acetaminophen 500 MG tablet Commonly known as: TYLENOL Take 2 tablets (1,000 mg total) by mouth every 6 (six) hours as needed for mild pain.   Adalimumab 40 MG/0.8ML Pnkt Inject 0.8 mLs into the skin every 14 (fourteen) days.    Alpha-Lipoic Acid 600 MG Caps Take 1 capsule (600 mg total) by mouth daily.   cyanocobalamin 1000 MCG tablet Commonly known as: VITAMIN B12 Take 1,000 mcg by mouth daily.   ELDERBERRY PO Take 1 tablet by mouth daily. Vitamin C and zinc   etodolac 400 MG tablet Commonly known as: LODINE Take 400 mg by mouth daily as needed for mild pain or moderate pain.   gabapentin 300 MG capsule Commonly known as: NEURONTIN TAKE 1 CAPSULE BY MOUTH THREE TIMES A DAY   lidocaine 5 % Commonly known as: Lidoderm Place 1 patch onto the skin daily. Remove & Discard patch within 12 hours or as directed by MD   Melatonin 10 MG Tabs Take 10 mg by mouth at bedtime as needed (sleep.).   methocarbamol 500 MG tablet Commonly known as: ROBAXIN Take 750 mg by mouth at bedtime.   methylPREDNISolone 4 MG Tbpk tablet Commonly known as: MEDROL DOSEPAK Take 4 mg by mouth daily. Reduce  by one tablet daily  Taper   multivitamin with minerals Tabs tablet Take 1 tablet by mouth daily. Centrum Silver   oxyCODONE 5 MG immediate release tablet Commonly known as: Oxy IR/ROXICODONE Take 1 tablet (5 mg total) by mouth every 6 (six) hours as needed for up to 8 days for moderate pain ((score 4 to 6)).   polyethylene glycol 17 g packet Commonly known as: MIRALAX / GLYCOLAX Take 17 g by mouth daily as needed for mild constipation.   pravastatin 40 MG tablet Commonly known as: PRAVACHOL Take 1 tablet (40 mg total) by mouth daily. For cholesterol What changed: additional instructions   tiZANidine 4 MG tablet Commonly known as: ZANAFLEX Take 1 tablet (4 mg total)  by mouth every 6 (six) hours as needed for muscle spasms.   vitamin C 250 MG tablet Commonly known as: ASCORBIC ACID Take 250 mg by mouth daily.        Follow-up Information     Ashok Pall, MD Follow up.   Specialty: Neurosurgery Why: keep your sacheduled appointment Contact information: [address] [place]  [address] [phone]                 Signed: Ashok Pall 01/06/2023, 1:22 PM

## 2023-01-09 ENCOUNTER — Telehealth: Payer: Self-pay

## 2023-01-09 ENCOUNTER — Other Ambulatory Visit: Payer: Self-pay

## 2023-01-09 NOTE — Transitions of Care (Post Inpatient/ED Visit) (Signed)
   01/09/2023  Name: Casey [name] MRN: [id number] DOB: 15-Sep-1946  Today's TOC FU Call Status: Today's TOC FU Call Status:: Successful TOC FU Call Competed TOC FU Call Complete Date: 01/09/23  Transition Care Management Follow-up Telephone Call Date of Discharge: 01/06/23 Discharge Facility: [facility] Type of Discharge: Inpatient Admission Primary Inpatient Discharge Diagnosis:: Lumbar stenosis with neurogenic claudication How have you been since you were released from the hospital?: Better Any questions or concerns?: No  Items Reviewed: Did you receive and understand the discharge instructions provided?: Yes Medications obtained and verified?: Yes (Medications Reviewed) Any new allergies since your discharge?: No Dietary orders reviewed?: NA Do you have support at home?: Yes  Home Care and Equipment/Supplies: [place] Ordered?: No Any new equipment or medical supplies ordered?: No  Functional Questionnaire: Do you need assistance with bathing/showering or dressing?: No Do you need assistance with meal preparation?: No Do you need assistance with eating?: No Do you have difficulty maintaining continence: No Do you need assistance with getting out of bed/getting out of a chair/moving?: No Do you have difficulty managing or taking your medications?: No  Folllow up appointments reviewed: PCP Follow-up appointment confirmed?: NA Specialist Hospital Follow-up appointment confirmed?: No Reason Specialist Follow-Up Not Confirmed: Patient has Specialist Provider Number and will Call for Appointment Do you need transportation to your follow-up appointment?: No Do you understand care options if your condition(s) worsen?: Yes-patient verbalized understanding    Asher LPN [place] Direct Dial [phone]

## 2023-01-26 ENCOUNTER — Encounter: Payer: Medicare HMO | Admitting: Primary Care

## 2023-01-26 DIAGNOSIS — M48062 Spinal stenosis, lumbar region with neurogenic claudication: Secondary | ICD-10-CM | POA: Diagnosis not present

## 2023-02-16 ENCOUNTER — Ambulatory Visit: Payer: Medicare HMO | Admitting: Podiatry

## 2023-02-22 DIAGNOSIS — M48062 Spinal stenosis, lumbar region with neurogenic claudication: Secondary | ICD-10-CM | POA: Diagnosis not present

## 2023-03-02 DIAGNOSIS — M48062 Spinal stenosis, lumbar region with neurogenic claudication: Secondary | ICD-10-CM | POA: Diagnosis not present

## 2023-03-06 DIAGNOSIS — M48062 Spinal stenosis, lumbar region with neurogenic claudication: Secondary | ICD-10-CM | POA: Diagnosis not present

## 2023-03-07 ENCOUNTER — Ambulatory Visit: Payer: Medicare HMO | Admitting: Podiatry

## 2023-03-09 DIAGNOSIS — M48062 Spinal stenosis, lumbar region with neurogenic claudication: Secondary | ICD-10-CM | POA: Diagnosis not present

## 2023-03-13 DIAGNOSIS — M48062 Spinal stenosis, lumbar region with neurogenic claudication: Secondary | ICD-10-CM | POA: Diagnosis not present

## 2023-03-15 DIAGNOSIS — M48062 Spinal stenosis, lumbar region with neurogenic claudication: Secondary | ICD-10-CM | POA: Diagnosis not present

## 2023-03-20 DIAGNOSIS — M48062 Spinal stenosis, lumbar region with neurogenic claudication: Secondary | ICD-10-CM | POA: Diagnosis not present

## 2023-03-22 ENCOUNTER — Ambulatory Visit (INDEPENDENT_AMBULATORY_CARE_PROVIDER_SITE_OTHER): Payer: Medicare HMO | Admitting: Primary Care

## 2023-03-22 ENCOUNTER — Encounter: Payer: Self-pay | Admitting: Primary Care

## 2023-03-22 VITALS — BP 136/76 | HR 77 | Temp 98.1°F | Ht 64.0 in | Wt 134.0 lb

## 2023-03-22 DIAGNOSIS — R8290 Unspecified abnormal findings in urine: Secondary | ICD-10-CM | POA: Insufficient documentation

## 2023-03-22 DIAGNOSIS — M48062 Spinal stenosis, lumbar region with neurogenic claudication: Secondary | ICD-10-CM | POA: Diagnosis not present

## 2023-03-22 LAB — POC URINALSYSI DIPSTICK (AUTOMATED)
Bilirubin, UA: NEGATIVE
Blood, UA: NEGATIVE
Glucose, UA: NEGATIVE
Ketones, UA: NEGATIVE
Leukocytes, UA: NEGATIVE
Nitrite, UA: NEGATIVE
Protein, UA: NEGATIVE
Spec Grav, UA: 1.015 (ref 1.010–1.025)
Urobilinogen, UA: 0.2 E.U./dL
pH, UA: 7.5 (ref 5.0–8.0)

## 2023-03-22 NOTE — Patient Instructions (Signed)
Continue to drink water during the day.  Please notify me if your symptoms return.  It was a pleasure to see you today!

## 2023-03-22 NOTE — Progress Notes (Signed)
Subjective:    Patient ID: Casey [name], female    DOB: April 28, 1946, 77 y.o.   MRN: [id number]  HPI  Casey [name] is a very pleasant 77 y.o. female with a history of chronic constipation, chronic back pain with lumbar spondylosis and facet arthropathy who presents today to discuss urine odor.   Symptom onset a few weeks ago with darker colored yellow urine with a foul odor. She denies vaginal itching, vaginal itching, fevers, abdominal pain.   She recently increased her intake of water and has noticed improvement in color and odor in her urine. Historically, she doesn't drink much of anything during the day.   BP Readings from Last 3 Encounters:  03/22/23 136/76  01/06/23 103/66  12/30/22 (!) 168/99      Review of Systems  Constitutional:  Negative for fever.  Gastrointestinal:  Negative for abdominal pain.  Genitourinary:  Negative for dysuria, frequency, hematuria and pelvic pain.       Dark colored urine. Foul smelling urine.         Past Medical History:  Diagnosis Date   Allergy    Chronic back pain    Herpes zoster without complication 10/09/2020   History of kidney stones    Osteoporosis    Rheumatoid arthritis     Social History   Socioeconomic History   Marital status: Widowed    Spouse name: Not on file   Number of children: Not on file   Years of education: Not on file   Highest education level: Not on file  Occupational History   Not on file  Tobacco Use   Smoking status: Never   Smokeless tobacco: Never  Vaping Use   Vaping Use: Never used  Substance and Sexual Activity   Alcohol use: Never   Drug use: Never   Sexual activity: Not on file  Other Topics Concern   Not on file  Social History Narrative   Not on file   Social Determinants of Health   Financial Resource Strain: Low Risk  (06/01/2022)   Overall Financial Resource Strain (CARDIA)    Difficulty of Paying Living Expenses: Not hard at all  Food Insecurity: No Food  Insecurity (06/01/2022)   Hunger Vital Sign    Worried About Running Out of Food in the Last Year: Never true    Ran Out of Food in the Last Year: Never true  Transportation Needs: No Transportation Needs (06/01/2022)   PRAPARE - Administrator, Civil Service (Medical): No    Lack of Transportation (Non-Medical): No  Physical Activity: Insufficiently Active (06/01/2022)   Exercise Vital Sign    Days of Exercise per Week: 1 day    Minutes of Exercise per Session: 10 min  Stress: No Stress Concern Present (06/01/2022)   [employer] of Occupational Health - Occupational Stress Questionnaire    Feeling of Stress : Not at all  Social Connections: Moderately Integrated (06/01/2022)   Social Connection and Isolation Panel [NHANES]    Frequency of Communication with Friends and Family: More than three times a week    Frequency of Social Gatherings with Friends and Family: More than three times a week    Attends Religious Services: More than 4 times per year    Active Member of [employer] or Organizations: Yes    Attends Banker Meetings: More than 4 times per year    Marital Status: Widowed  Intimate Partner Violence: Not At Risk (06/01/2022)   Humiliation,  Afraid, Rape, and Kick questionnaire    Fear of Current or Ex-Partner: No    Emotionally Abused: No    Physically Abused: No    Sexually Abused: No    Past Surgical History:  Procedure Laterality Date   ANTERIOR LATERAL LUMBAR FUSION WITH PERCUTANEOUS SCREW 1 LEVEL N/A 06/17/2020   Procedure: L4-5 EXTREME LATERAL INTERBODY FUSION(XLIF)/POSTERIOR SPINAL FUSION (PSF);  Surgeon: Venetia Night, MD;  Location: [facility];  Service: Neurosurgery;  Laterality: N/A;   EPIDURAL BLOCK INJECTION     great toe Left    straightened and pinned   LUMBAR LAMINECTOMY/DECOMPRESSION MICRODISCECTOMY Right 06/17/2020   Procedure: RIGHT L2-3 MICRODISCECTOMY;  Surgeon: Venetia Night, MD;  Location: [facility];  Service: Neurosurgery;   Laterality: Right;   NECK SURGERY     ROTATOR CUFF REPAIR Right     Family History  Problem Relation Age of Onset   Heart attack Mother    Mental illness Mother    Bone cancer Brother    Heart attack Brother     Allergies  Allergen Reactions   Leflunomide Other (See Comments)    GI upset     Methotrexate Other (See Comments)    GI upset    Current Outpatient Medications on File Prior to Visit  Medication Sig Dispense Refill   acetaminophen (TYLENOL) 500 MG tablet Take 2 tablets (1,000 mg total) by mouth every 6 (six) hours as needed for mild pain. 30 tablet 0   Adalimumab 40 MG/0.8ML PNKT Inject 0.8 mLs into the skin every 14 (fourteen) days.     cyanocobalamin (VITAMIN B12) 1000 MCG tablet Take 1,000 mcg by mouth daily.     ELDERBERRY PO Take 1 tablet by mouth daily. Vitamin C and zinc     etodolac (LODINE) 400 MG tablet Take 400 mg by mouth daily as needed for mild pain or moderate pain.     gabapentin (NEURONTIN) 300 MG capsule TAKE 1 CAPSULE BY MOUTH THREE TIMES A DAY     Melatonin 10 MG TABS Take 10 mg by mouth at bedtime as needed (sleep.).     Multiple Vitamin (MULTIVITAMIN WITH MINERALS) TABS tablet Take 1 tablet by mouth daily. Centrum Silver     polyethylene glycol (MIRALAX / GLYCOLAX) 17 g packet Take 17 g by mouth daily as needed for mild constipation. 14 each 0   pravastatin (PRAVACHOL) 40 MG tablet Take 1 tablet (40 mg total) by mouth daily. For cholesterol (Patient taking differently: Take 40 mg by mouth daily. For cholesterol- "patient is not taking at this time, my numbers are good.") 90 tablet 3   vitamin C (ASCORBIC ACID) 250 MG tablet Take 250 mg by mouth daily.     Alpha-Lipoic Acid 600 MG CAPS Take 1 capsule (600 mg total) by mouth daily. (Patient not taking: Reported on 01/09/2023) 30 capsule 5   lidocaine (LIDODERM) 5 % Place 1 patch onto the skin daily. Remove & Discard patch within 12 hours or as directed by MD (Patient not taking: Reported on 03/22/2023)  30 patch 0   methocarbamol (ROBAXIN) 500 MG tablet Take 750 mg by mouth at bedtime. (Patient not taking: Reported on 03/22/2023)     methylPREDNISolone (MEDROL DOSEPAK) 4 MG TBPK tablet Take 4 mg by mouth daily. Reduce  by one tablet daily  Taper (Patient not taking: Reported on 03/22/2023)     tiZANidine (ZANAFLEX) 4 MG tablet Take 1 tablet (4 mg total) by mouth every 6 (six) hours as needed for muscle spasms. (Patient  not taking: Reported on 03/22/2023) 60 tablet 0   No current facility-administered medications on file prior to visit.    BP 136/76   Pulse 77   Temp 98.1 F (36.7 C) (Temporal)   Ht  (1.626 m)   Wt 134 lb (60.8 kg)   SpO2 97%   BMI 23.00 kg/m  Objective:   Physical Exam Cardiovascular:     Rate and Rhythm: Normal rate and regular rhythm.  Pulmonary:     Effort: Pulmonary effort is normal.     Breath sounds: Normal breath sounds.  Abdominal:     Tenderness: There is no right CVA tenderness or left CVA tenderness.  Musculoskeletal:     Cervical back: Neck supple.  Skin:    General: Skin is warm and dry.           Assessment & Plan:  Foul smelling urine Assessment & Plan: Likely secondary to little water intake as symptoms have improved.  UA today negative. Continue good water intake.  Follow up PRN.  Orders: -     POCT Urinalysis Dipstick (Automated)        Doreene Nest, NP

## 2023-03-22 NOTE — Assessment & Plan Note (Signed)
Likely secondary to little water intake as symptoms have improved.  UA today negative. Continue good water intake.  Follow up PRN.

## 2023-04-04 DIAGNOSIS — M48062 Spinal stenosis, lumbar region with neurogenic claudication: Secondary | ICD-10-CM | POA: Diagnosis not present

## 2023-04-07 ENCOUNTER — Encounter: Payer: Self-pay | Admitting: Primary Care

## 2023-04-07 ENCOUNTER — Ambulatory Visit (INDEPENDENT_AMBULATORY_CARE_PROVIDER_SITE_OTHER): Payer: Medicare HMO | Admitting: Primary Care

## 2023-04-07 VITALS — BP 122/74 | HR 82 | Temp 98.2°F | Ht 64.0 in | Wt 135.0 lb

## 2023-04-07 DIAGNOSIS — M810 Age-related osteoporosis without current pathological fracture: Secondary | ICD-10-CM

## 2023-04-07 DIAGNOSIS — Z0000 Encounter for general adult medical examination without abnormal findings: Secondary | ICD-10-CM

## 2023-04-07 DIAGNOSIS — K5909 Other constipation: Secondary | ICD-10-CM | POA: Diagnosis not present

## 2023-04-07 DIAGNOSIS — Z0001 Encounter for general adult medical examination with abnormal findings: Secondary | ICD-10-CM | POA: Diagnosis not present

## 2023-04-07 DIAGNOSIS — E785 Hyperlipidemia, unspecified: Secondary | ICD-10-CM | POA: Diagnosis not present

## 2023-04-07 DIAGNOSIS — H6122 Impacted cerumen, left ear: Secondary | ICD-10-CM

## 2023-04-07 DIAGNOSIS — M5416 Radiculopathy, lumbar region: Secondary | ICD-10-CM | POA: Diagnosis not present

## 2023-04-07 DIAGNOSIS — M069 Rheumatoid arthritis, unspecified: Secondary | ICD-10-CM

## 2023-04-07 DIAGNOSIS — G8929 Other chronic pain: Secondary | ICD-10-CM | POA: Diagnosis not present

## 2023-04-07 LAB — LIPID PANEL
Cholesterol: 154 mg/dL (ref 0–200)
HDL: 62.7 mg/dL (ref >39.00)
LDL Cholesterol: 80 mg/dL (ref 0–99)
NonHDL: 91.52
Total CHOL/HDL Ratio: 2
Triglycerides: 57 mg/dL (ref 0.0–149.0)
VLDL: 11.4 mg/dL (ref 0.0–40.0)

## 2023-04-07 LAB — COMPREHENSIVE METABOLIC PANEL
ALT: 12 U/L (ref 0–35)
AST: 24 U/L (ref 0–37)
Albumin: 3.9 g/dL (ref 3.5–5.2)
Alkaline Phosphatase: 69 U/L (ref 39–117)
BUN: 15 mg/dL (ref 6–23)
CO2: 30 mEq/L (ref 19–32)
Calcium: 9.3 mg/dL (ref 8.4–10.5)
Chloride: 103 mEq/L (ref 96–112)
Creatinine, Ser: 0.79 mg/dL (ref 0.40–1.20)
GFR: 71.51 mL/min (ref >60.00)
Glucose, Bld: 87 mg/dL (ref 70–99)
Potassium: 4.4 mEq/L (ref 3.5–5.1)
Sodium: 139 mEq/L (ref 135–145)
Total Bilirubin: 0.4 mg/dL (ref 0.2–1.2)
Total Protein: 7.3 g/dL (ref 6.0–8.3)

## 2023-04-07 LAB — CBC
HCT: 36.5 % (ref 36.0–46.0)
Hemoglobin: 12.1 g/dL (ref 12.0–15.0)
MCHC: 33.3 g/dL (ref 30.0–36.0)
MCV: 90.9 fl (ref 78.0–100.0)
Platelets: 221 10*3/uL (ref 150.0–400.0)
RBC: 4.02 Mil/uL (ref 3.87–5.11)
RDW: 13 % (ref 11.5–15.5)
WBC: 4.8 10*3/uL (ref 4.0–10.5)

## 2023-04-07 LAB — HEMOGLOBIN A1C: Hgb A1c MFr Bld: 5.7 % (ref 4.6–6.5)

## 2023-04-07 NOTE — Addendum Note (Signed)
Addended by: Doreene Nest on: 04/07/2023 11:38 AM   Modules accepted: Orders

## 2023-04-07 NOTE — Progress Notes (Addendum)
Subjective:    Patient ID: Casey [name], female    DOB: 1946-06-26, 77 y.o.   MRN: [id number]  HPI  Casey [name] is a very pleasant 77 y.o. female who presents today for complete physical and follow up of chronic conditions.  Immunizations: -Tetanus: Completed in 2019 -Pneumonia: Completed Prevnar 13 in 2018 and Pneumovax 23 in 2013  Diet: Fair diet.  Exercise: No regular exercise.  Eye exam: Completes annually  Dental exam: Completes semi-annually    Mammogram: April and May 2023, due and ordered.  Bone Density Scan: 2020, follows with rheumatology, scheduled for May 2024  Colonoscopy: Completed years ago, no further imaging required.    BP Readings from Last 3 Encounters:  04/07/23 122/74  03/22/23 136/76  01/06/23 103/66      Review of Systems  Constitutional:  Negative for unexpected weight change.  HENT:  Negative for rhinorrhea.   Respiratory:  Negative for cough and shortness of breath.   Cardiovascular:  Negative for chest pain.  Gastrointestinal:  Negative for constipation and diarrhea.  Genitourinary:  Negative for difficulty urinating.  Musculoskeletal:  Positive for arthralgias and myalgias.  Skin:  Negative for rash.  Allergic/Immunologic: Negative for environmental allergies.  Neurological:  Negative for dizziness and headaches.  Psychiatric/Behavioral:  The patient is not nervous/anxious.          Past Medical History:  Diagnosis Date   Allergy    Anterolisthesis 06/17/2020   Chronic back pain    Herpes zoster without complication 10/09/2020   History of kidney stones    Lumbar facet arthropathy 10/31/2019   Nondisplaced fracture of right radial styloid process, initial encounter for closed fracture 08/06/2018   Osteoporosis    Rheumatoid arthritis (HCC)     Social History   Socioeconomic History   Marital status: Widowed    Spouse name: Not on file   Number of children: Not on file   Years of education: Not on file   Highest  education level: Not on file  Occupational History   Not on file  Tobacco Use   Smoking status: Never   Smokeless tobacco: Never  Vaping Use   Vaping Use: Never used  Substance and Sexual Activity   Alcohol use: Never   Drug use: Never   Sexual activity: Not on file  Other Topics Concern   Not on file  Social History Narrative   Not on file   Social Determinants of Health   Financial Resource Strain: Low Risk  (06/01/2022)   Overall Financial Resource Strain (CARDIA)    Difficulty of Paying Living Expenses: Not hard at all  Food Insecurity: No Food Insecurity (06/01/2022)   Hunger Vital Sign    Worried About Running Out of Food in the Last Year: Never true    Ran Out of Food in the Last Year: Never true  Transportation Needs: No Transportation Needs (06/01/2022)   PRAPARE - Administrator, Civil Service (Medical): No    Lack of Transportation (Non-Medical): No  Physical Activity: Insufficiently Active (06/01/2022)   Exercise Vital Sign    Days of Exercise per Week: 1 day    Minutes of Exercise per Session: 10 min  Stress: No Stress Concern Present (06/01/2022)   [employer] of Occupational Health - Occupational Stress Questionnaire    Feeling of Stress : Not at all  Social Connections: Moderately Integrated (06/01/2022)   Social Connection and Isolation Panel [NHANES]    Frequency of Communication with Friends and  Family: More than three times a week    Frequency of Social Gatherings with Friends and Family: More than three times a week    Attends Religious Services: More than 4 times per year    Active Member of Clubs or Organizations: Yes    Attends Banker Meetings: More than 4 times per year    Marital Status: Widowed  Intimate Partner Violence: Not At Risk (06/01/2022)   Humiliation, Afraid, Rape, and Kick questionnaire    Fear of Current or Ex-Partner: No    Emotionally Abused: No    Physically Abused: No    Sexually Abused: No    Past  Surgical History:  Procedure Laterality Date   ANTERIOR LATERAL LUMBAR FUSION WITH PERCUTANEOUS SCREW 1 LEVEL N/A 06/17/2020   Procedure: L4-5 EXTREME LATERAL INTERBODY FUSION(XLIF)/POSTERIOR SPINAL FUSION (PSF);  Surgeon: Venetia Night, MD;  Location: [facility];  Service: Neurosurgery;  Laterality: N/A;   EPIDURAL BLOCK INJECTION     great toe Left    straightened and pinned   LUMBAR LAMINECTOMY/DECOMPRESSION MICRODISCECTOMY Right 06/17/2020   Procedure: RIGHT L2-3 MICRODISCECTOMY;  Surgeon: Venetia Night, MD;  Location: [facility];  Service: Neurosurgery;  Laterality: Right;   NECK SURGERY     ROTATOR CUFF REPAIR Right     Family History  Problem Relation Age of Onset   Heart attack Mother    Mental illness Mother    Bone cancer Brother    Heart attack Brother     Allergies  Allergen Reactions   Leflunomide Other (See Comments)    GI upset     Methotrexate Other (See Comments)    GI upset    Current Outpatient Medications on File Prior to Visit  Medication Sig Dispense Refill   acetaminophen (TYLENOL) 500 MG tablet Take 2 tablets (1,000 mg total) by mouth every 6 (six) hours as needed for mild pain. 30 tablet 0   Adalimumab 40 MG/0.8ML PNKT Inject 0.8 mLs into the skin every 14 (fourteen) days.     cyanocobalamin (VITAMIN B12) 1000 MCG tablet Take 1,000 mcg by mouth daily.     ELDERBERRY PO Take 1 tablet by mouth daily. Vitamin C and zinc     etodolac (LODINE) 400 MG tablet Take 400 mg by mouth daily as needed for mild pain or moderate pain.     gabapentin (NEURONTIN) 300 MG capsule TAKE 1 CAPSULE BY MOUTH THREE TIMES A DAY     Melatonin 10 MG TABS Take 10 mg by mouth at bedtime as needed (sleep.).     Multiple Vitamin (MULTIVITAMIN WITH MINERALS) TABS tablet Take 1 tablet by mouth daily. Centrum Silver     polyethylene glycol (MIRALAX / GLYCOLAX) 17 g packet Take 17 g by mouth daily as needed for mild constipation. 14 each 0   pravastatin (PRAVACHOL) 40 MG tablet  Take 1 tablet (40 mg total) by mouth daily. For cholesterol (Patient taking differently: Take 40 mg by mouth daily. For cholesterol- "patient is not taking at this time, my numbers are good.") 90 tablet 3   vitamin C (ASCORBIC ACID) 250 MG tablet Take 250 mg by mouth daily.     tiZANidine (ZANAFLEX) 4 MG tablet Take 1 tablet (4 mg total) by mouth every 6 (six) hours as needed for muscle spasms. (Patient not taking: Reported on 03/22/2023) 60 tablet 0   No current facility-administered medications on file prior to visit.    BP 122/74   Pulse 82   Temp 98.2 F (36.8 C) (  Temporal)   Ht 5\' 4"  (1.626 m)   Wt 135 lb (61.2 kg)   SpO2 98%   BMI 23.17 kg/m  Objective:   Physical Exam HENT:     Right Ear: Tympanic membrane and ear canal normal.     Left Ear: There is impacted cerumen.     Nose: Nose normal.  Eyes:     Conjunctiva/sclera: Conjunctivae normal.     Pupils: Pupils are equal, round, and reactive to light.  Neck:     Thyroid: No thyromegaly.  Cardiovascular:     Rate and Rhythm: Normal rate and regular rhythm.     Heart sounds: No murmur heard. Pulmonary:     Effort: Pulmonary effort is normal.     Breath sounds: Normal breath sounds. No rales.  Abdominal:     General: Bowel sounds are normal.     Palpations: Abdomen is soft.     Tenderness: There is no abdominal tenderness.  Musculoskeletal:        General: Normal range of motion.     Cervical back: Neck supple.  Lymphadenopathy:     Cervical: No cervical adenopathy.  Skin:    General: Skin is warm and dry.     Findings: No rash.  Neurological:     Mental Status: She is alert and oriented to person, place, and time.     Cranial Nerves: No cranial nerve deficit.     Deep Tendon Reflexes: Reflexes are normal and symmetric.  Psychiatric:        Mood and Affect: Mood normal.           Assessment & Plan:  Rheumatoid arthritis involving multiple sites, unspecified whether rheumatoid factor present  [facility]) Assessment & Plan: Follows with rheumatology, office notes reviewed from October 2023.  Continue Humira 0.8 ml every 2 weeks.  Bone density scan pending and scheduled.   Orders: -     CBC  Chronic radicular lumbar pain Assessment & Plan: Improved with recent lumbar fusion and laminectomy. Following with neurosurgery.  Continue gabapentin 300 mg BID, Etodolac 400 mg PRN, Tizanidine 4 mg PRN.   Osteoporosis, unspecified osteoporosis type, unspecified pathological fracture presence Assessment & Plan: Bone density scan due and pending. Following with rheumatology   Chronic constipation Assessment & Plan: Chronic and ongoing.  Discussed Benefiber. She will update.   Impacted cerumen of left ear Assessment & Plan: Left cerumen impaction identified on exam. Patient consented to irrigation of canals bilaterally.  Left canal irrigated. Patient tolerated well. TM's and canals post irrigation unremarkable.   Discussed home care instructions.     Hyperlipidemia, unspecified hyperlipidemia type Assessment & Plan: Repeat lipid panel pending. Continue pravastatin 40 mg daily.  Orders: -     Lipid panel -     Hemoglobin A1c -     Comprehensive metabolic panel  Preventative health care Assessment & Plan: Immunizations UTD. Bone density scan due and is scheduled.  Mammogram due, orders pending Colonoscopy screening no longer needed given age  Discussed the importance of a healthy diet and regular exercise in order for weight loss, and to reduce the risk of further co-morbidity.  Exam stable. Labs pending.  Follow up in 1 year for repeat physical.          Doreene Nest, NP

## 2023-04-07 NOTE — Assessment & Plan Note (Signed)
Bone density scan due and pending. Following with rheumatology

## 2023-04-07 NOTE — Assessment & Plan Note (Signed)
Immunizations UTD. Bone density scan due and is scheduled.  Mammogram due, orders pending Colonoscopy screening no longer needed given age  Discussed the importance of a healthy diet and regular exercise in order for weight loss, and to reduce the risk of further co-morbidity.  Exam stable. Labs pending.  Follow up in 1 year for repeat physical.

## 2023-04-07 NOTE — Assessment & Plan Note (Signed)
Chronic and ongoing.  Discussed Benefiber. She will update.

## 2023-04-07 NOTE — Assessment & Plan Note (Addendum)
Follows with rheumatology, office notes reviewed from October 2023.  Continue Humira 0.8 ml every 2 weeks.  Bone density scan pending and scheduled.

## 2023-04-07 NOTE — Assessment & Plan Note (Signed)
Repeat lipid panel pending. Continue pravastatin 40 mg daily. 

## 2023-04-07 NOTE — Assessment & Plan Note (Signed)
Left cerumen impaction identified on exam. Patient consented to irrigation of canals bilaterally.  Left canal irrigated. Patient tolerated well. TM's and canals post irrigation unremarkable.   Discussed home care instructions.

## 2023-04-07 NOTE — Patient Instructions (Signed)
Stop by the lab prior to leaving today. I will notify you of your results once received.   Schedule your mammogram.  Try Benefiber for constipation.   It was a pleasure to see you today!

## 2023-04-07 NOTE — Assessment & Plan Note (Signed)
Improved with recent lumbar fusion and laminectomy. Following with neurosurgery.  Continue gabapentin 300 mg BID, Etodolac 400 mg PRN, Tizanidine 4 mg PRN.

## 2023-04-10 ENCOUNTER — Ambulatory Visit (INDEPENDENT_AMBULATORY_CARE_PROVIDER_SITE_OTHER): Payer: Medicare HMO | Admitting: Podiatry

## 2023-04-10 DIAGNOSIS — Z91199 Patient's noncompliance with other medical treatment and regimen due to unspecified reason: Secondary | ICD-10-CM

## 2023-04-12 DIAGNOSIS — M5441 Lumbago with sciatica, right side: Secondary | ICD-10-CM | POA: Diagnosis not present

## 2023-04-12 DIAGNOSIS — G8929 Other chronic pain: Secondary | ICD-10-CM | POA: Diagnosis not present

## 2023-04-12 DIAGNOSIS — M0579 Rheumatoid arthritis with rheumatoid factor of multiple sites without organ or systems involvement: Secondary | ICD-10-CM | POA: Diagnosis not present

## 2023-04-12 DIAGNOSIS — M5442 Lumbago with sciatica, left side: Secondary | ICD-10-CM | POA: Diagnosis not present

## 2023-04-12 DIAGNOSIS — Z79899 Other long term (current) drug therapy: Secondary | ICD-10-CM | POA: Diagnosis not present

## 2023-04-12 NOTE — Progress Notes (Signed)
1. No-show for appointment     

## 2023-04-19 DIAGNOSIS — M545 Low back pain, unspecified: Secondary | ICD-10-CM | POA: Diagnosis not present

## 2023-04-19 DIAGNOSIS — M48062 Spinal stenosis, lumbar region with neurogenic claudication: Secondary | ICD-10-CM | POA: Diagnosis not present

## 2023-04-19 DIAGNOSIS — M4316 Spondylolisthesis, lumbar region: Secondary | ICD-10-CM | POA: Diagnosis not present

## 2023-04-27 ENCOUNTER — Ambulatory Visit: Payer: Medicare HMO | Admitting: Podiatry

## 2023-05-02 DIAGNOSIS — M48062 Spinal stenosis, lumbar region with neurogenic claudication: Secondary | ICD-10-CM | POA: Diagnosis not present

## 2023-05-03 DIAGNOSIS — H524 Presbyopia: Secondary | ICD-10-CM | POA: Diagnosis not present

## 2023-05-03 DIAGNOSIS — H25813 Combined forms of age-related cataract, bilateral: Secondary | ICD-10-CM | POA: Diagnosis not present

## 2023-05-19 DIAGNOSIS — H2513 Age-related nuclear cataract, bilateral: Secondary | ICD-10-CM | POA: Diagnosis not present

## 2023-05-19 DIAGNOSIS — H40013 Open angle with borderline findings, low risk, bilateral: Secondary | ICD-10-CM | POA: Diagnosis not present

## 2023-05-19 DIAGNOSIS — H2511 Age-related nuclear cataract, right eye: Secondary | ICD-10-CM | POA: Diagnosis not present

## 2023-05-20 DIAGNOSIS — M7918 Myalgia, other site: Secondary | ICD-10-CM | POA: Diagnosis not present

## 2023-06-05 ENCOUNTER — Ambulatory Visit (INDEPENDENT_AMBULATORY_CARE_PROVIDER_SITE_OTHER): Payer: Medicare HMO

## 2023-06-05 VITALS — Ht 64.0 in | Wt 142.0 lb

## 2023-06-05 DIAGNOSIS — Z0000 Encounter for general adult medical examination without abnormal findings: Secondary | ICD-10-CM | POA: Diagnosis not present

## 2023-06-05 DIAGNOSIS — Z780 Asymptomatic menopausal state: Secondary | ICD-10-CM

## 2023-06-05 DIAGNOSIS — Z1231 Encounter for screening mammogram for malignant neoplasm of breast: Secondary | ICD-10-CM | POA: Diagnosis not present

## 2023-06-05 NOTE — Patient Instructions (Addendum)
[name] , Thank you for taking time to come for your Medicare Wellness Visit. I appreciate your ongoing commitment to your health goals. Please review the following plan we discussed and let me know if I can assist you in the future.   These are the goals we discussed:  Goals       Increase physical activity (pt-stated)      Patient Stated      05/21/2020, I will maintain and continue medications as prescribed.       Patient Stated      Increase exercise as tolerated.        This is a list of the screening recommended for you and due dates:  Health Maintenance  Topic Date Due   Hepatitis C Screening  Never done   COVID-19 Vaccine (3 - Pfizer risk series) 02/10/2020   Medicare Annual Wellness Visit  06/02/2023   Zoster (Shingles) Vaccine (1 of 2) 07/08/2023*   Flu Shot  06/29/2023   DTaP/Tdap/Td vaccine (2 - Td or Tdap) 07/16/2028   Pneumonia Vaccine  Completed   DEXA scan (bone density measurement)  Completed   HPV Vaccine  Aged Out  *Topic was postponed. The date shown is not the original due date.   You have an order for:  []   2D Mammogram  [x]   3D Mammogram  [x]   Bone Density     Please call for appointment:  [facility]  [address] [place] [address] [phone]  [facility] [address] [place], [place] [address] [phone]  [facility] [address] [place], [place] [address] [phone]    Make sure to wear two-piece clothing.  No lotions, powders, or deodorants the day of the appointment. Make sure to bring picture ID and insurance card.  Bring list of medications you are currently taking including any supplements.   Schedule your [place] screening mammogram through MyChart!   Log into your MyChart account.  Go to 'Visit' (or 'Appointments' if on mobile App) --> Schedule an Appointment  Under  'Select a Reason for Visit' choose the Mammogram Screening option.  Complete the pre-visit questions and select the time and place that best fits your schedule.     Advanced directives: Please bring a copy of your health care power of attorney and living will to the office to be added to your chart at your convenience.   Conditions/risks identified: Aim for 30 minutes of exercise or brisk walking, 6-8 glasses of water, and 5 servings of fruits and vegetables each day.   Next appointment: Follow up in one year for your annual wellness visit 06/05/24 @ 2pm telephone visit   Preventive Care 65 Years and Older, Female Preventive care refers to lifestyle choices and visits with your health care provider that can promote health and wellness. What does preventive care include? A yearly physical exam. This is also called an annual well check. Dental exams once or twice a year. Routine eye exams. Ask your health care provider how often you should have your eyes checked. Personal lifestyle choices, including: Daily care of your teeth and gums. Regular physical activity. Eating a healthy diet. Avoiding tobacco and drug use. Limiting alcohol use. Practicing safe sex. Taking low-dose aspirin every day. Taking vitamin and mineral supplements as recommended by your health care provider. What happens during an annual well check? The services and screenings  done by your health care provider during your annual well check will depend on your age, overall health, lifestyle risk factors, and family history of disease. Counseling  Your health care provider may ask you questions about your: Alcohol use. Tobacco use. Drug use. Emotional well-being. Home and relationship well-being. Sexual activity. Eating habits. History of falls. Memory and ability to understand (cognition). Work and work Astronomer. Reproductive health. Screening  You may have the following tests or measurements: Height,  weight, and BMI. Blood pressure. Lipid and cholesterol levels. These may be checked every 5 years, or more frequently if you are over 55 years old. Skin check. Lung cancer screening. You may have this screening every year starting at age 58 if you have a 30-pack-year history of smoking and currently smoke or have quit within the past 15 years. Fecal occult blood test (FOBT) of the stool. You may have this test every year starting at age 63. Flexible sigmoidoscopy or colonoscopy. You may have a sigmoidoscopy every 5 years or a colonoscopy every 10 years starting at age 11. Hepatitis C blood test. Hepatitis B blood test. Sexually transmitted disease (STD) testing. Diabetes screening. This is done by checking your blood sugar (glucose) after you have not eaten for a while (fasting). You may have this done every 1-3 years. Bone density scan. This is done to screen for osteoporosis. You may have this done starting at age 75. Mammogram. This may be done every 1-2 years. Talk to your health care provider about how often you should have regular mammograms. Talk with your health care provider about your test results, treatment options, and if necessary, the need for more tests. Vaccines  Your health care provider may recommend certain vaccines, such as: Influenza vaccine. This is recommended every year. Tetanus, diphtheria, and acellular pertussis (Tdap, Td) vaccine. You may need a Td booster every 10 years. Zoster vaccine. You may need this after age 38. Pneumococcal 13-valent conjugate (PCV13) vaccine. One dose is recommended after age 85. Pneumococcal polysaccharide (PPSV23) vaccine. One dose is recommended after age 74. Talk to your health care provider about which screenings and vaccines you need and how often you need them. This information is not intended to replace advice given to you by your health care provider. Make sure you discuss any questions you have with your health care  provider. Document Released: 12/11/2015 Document Revised: 08/03/2016 Document Reviewed: 09/15/2015 Elsevier Interactive Patient Education  2017 [employer].  Fall Prevention in the Home Falls can cause injuries. They can happen to people of all ages. There are many things you can do to make your home safe and to help prevent falls. What can I do on the outside of my home? Regularly fix the edges of walkways and driveways and fix any cracks. Remove anything that might make you trip as you walk through a door, such as a raised step or threshold. Trim any bushes or trees on the path to your home. Use bright outdoor lighting. Clear any walking paths of anything that might make someone trip, such as rocks or tools. Regularly check to see if handrails are loose or broken. Make sure that both sides of any steps have handrails. Any raised decks and porches should have guardrails on the edges. Have any leaves, snow, or ice cleared regularly. Use sand or salt on walking paths during winter. Clean up any spills in your garage right away. This includes oil or grease spills. What can I do in the bathroom? Use night lights.  Install grab bars by the toilet and in the tub and shower. Do not use towel bars as grab bars. Use non-skid mats or decals in the tub or shower. If you need to sit down in the shower, use a plastic, non-slip stool. Keep the floor dry. Clean up any water that spills on the floor as soon as it happens. Remove soap buildup in the tub or shower regularly. Attach bath mats securely with double-sided non-slip rug tape. Do not have throw rugs and other things on the floor that can make you trip. What can I do in the bedroom? Use night lights. Make sure that you have a light by your bed that is easy to reach. Do not use any sheets or blankets that are too big for your bed. They should not hang down onto the floor. Have a firm chair that has side arms. You can use this for support while  you get dressed. Do not have throw rugs and other things on the floor that can make you trip. What can I do in the kitchen? Clean up any spills right away. Avoid walking on wet floors. Keep items that you use a lot in easy-to-reach places. If you need to reach something above you, use a strong step stool that has a grab bar. Keep electrical cords out of the way. Do not use floor polish or wax that makes floors slippery. If you must use wax, use non-skid floor wax. Do not have throw rugs and other things on the floor that can make you trip. What can I do with my stairs? Do not leave any items on the stairs. Make sure that there are handrails on both sides of the stairs and use them. Fix handrails that are broken or loose. Make sure that handrails are as long as the stairways. Check any carpeting to make sure that it is firmly attached to the stairs. Fix any carpet that is loose or worn. Avoid having throw rugs at the top or bottom of the stairs. If you do have throw rugs, attach them to the floor with carpet tape. Make sure that you have a light switch at the top of the stairs and the bottom of the stairs. If you do not have them, ask someone to add them for you. What else can I do to help prevent falls? Wear shoes that: Do not have high heels. Have rubber bottoms. Are comfortable and fit you well. Are closed at the toe. Do not wear sandals. If you use a stepladder: Make sure that it is fully opened. Do not climb a closed stepladder. Make sure that both sides of the stepladder are locked into place. Ask someone to hold it for you, if possible. Clearly mark and make sure that you can see: Any grab bars or handrails. First and last steps. Where the edge of each step is. Use tools that help you move around (mobility aids) if they are needed. These include: Canes. Walkers. Scooters. Crutches. Turn on the lights when you go into a dark area. Replace any light bulbs as soon as they burn  out. Set up your furniture so you have a clear path. Avoid moving your furniture around. If any of your floors are uneven, fix them. If there are any pets around you, be aware of where they are. Review your medicines with your doctor. Some medicines can make you feel dizzy. This can increase your chance of falling. Ask your doctor what other things that you  can do to help prevent falls. This information is not intended to replace advice given to you by your health care provider. Make sure you discuss any questions you have with your health care provider. Document Released: 09/10/2009 Document Revised: 04/21/2016 Document Reviewed: 12/19/2014 Elsevier Interactive Patient Education  2017 [employer].

## 2023-06-05 NOTE — Progress Notes (Signed)
Subjective:   Casey [name] is a 77 y.o. female who presents for Medicare Annual (Subsequent) preventive examination.  Visit Complete: Virtual  I connected with  Casey [name] on 06/05/23 by a audio enabled telemedicine application and verified that I am speaking with the correct person using two identifiers.  Patient Location: Home  Provider Location: Office/Clinic  I discussed the limitations of evaluation and management by telemedicine. The patient expressed understanding and agreed to proceed.   Review of Systems      Cardiac Risk Factors include: advanced age (>29men, >26 women);dyslipidemia;sedentary lifestyle     Objective:    Today's Vitals   06/05/23 1525  Weight: 142 lb (64.4 kg)  Height: 5\' 4"  (1.626 m)   Body mass index is 24.37 kg/m.     06/05/2023    3:33 PM 01/04/2023   12:16 PM 12/14/2022    7:29 AM 08/08/2022    9:42 AM 06/01/2022   11:35 AM 03/04/2022    9:26 AM 06/17/2020   12:30 PM  Advanced Directives  Does Patient Have a Medical Advance Directive? No Yes Yes Yes Yes No Yes  Type of Advance Directive  Living will;Healthcare Power of [employer] Power of [place];Living will  Healthcare Power of [place];Living will  Healthcare Power of [place];Living will  Does patient want to make changes to medical advance directive?  No - Guardian declined No - Patient declined  No - Patient declined  No - Guardian declined  Copy of Healthcare Power of Attorney in Chart?  No - copy requested No - copy requested  No - copy requested  No - copy requested  Would patient like information on creating a medical advance directive? No - Patient declined     No - Patient declined     Current Medications (verified) Outpatient Encounter Medications as of 06/05/2023  Medication Sig   acetaminophen (TYLENOL) 500 MG tablet Take 2 tablets (1,000 mg total) by mouth every 6 (six) hours as needed for mild pain.   Adalimumab 40 MG/0.8ML PNKT Inject 0.8 mLs into the skin every  14 (fourteen) days.   cyanocobalamin (VITAMIN B12) 1000 MCG tablet Take 1,000 mcg by mouth daily.   ELDERBERRY PO Take 1 tablet by mouth daily. Vitamin C and zinc   etodolac (LODINE) 400 MG tablet Take 400 mg by mouth daily as needed for mild pain or moderate pain.   gabapentin (NEURONTIN) 300 MG capsule TAKE 1 CAPSULE BY MOUTH THREE TIMES A DAY   Melatonin 10 MG TABS Take 10 mg by mouth at bedtime as needed (sleep.).   Multiple Vitamin (MULTIVITAMIN WITH MINERALS) TABS tablet Take 1 tablet by mouth daily. Centrum Silver   polyethylene glycol (MIRALAX / GLYCOLAX) 17 g packet Take 17 g by mouth daily as needed for mild constipation.   pravastatin (PRAVACHOL) 40 MG tablet Take 1 tablet (40 mg total) by mouth daily. For cholesterol (Patient taking differently: Take 40 mg by mouth daily. For cholesterol- "patient is not taking at this time, my numbers are good.")   vitamin C (ASCORBIC ACID) 250 MG tablet Take 250 mg by mouth daily.   tiZANidine (ZANAFLEX) 4 MG tablet Take 1 tablet (4 mg total) by mouth every 6 (six) hours as needed for muscle spasms. (Patient not taking: Reported on 03/22/2023)   No facility-administered encounter medications on file as of 06/05/2023.    Allergies (verified) Leflunomide and Methotrexate   History: Past Medical History:  Diagnosis Date   Allergy    Anterolisthesis  06/17/2020   Chronic back pain    Herpes zoster without complication 10/09/2020   History of kidney stones    Lumbar facet arthropathy 10/31/2019   Nondisplaced fracture of right radial styloid process, initial encounter for closed fracture 08/06/2018   Osteoporosis    Rheumatoid arthritis (HCC)    Past Surgical History:  Procedure Laterality Date   ANTERIOR LATERAL LUMBAR FUSION WITH PERCUTANEOUS SCREW 1 LEVEL N/A 06/17/2020   Procedure: L4-5 EXTREME LATERAL INTERBODY FUSION(XLIF)/POSTERIOR SPINAL FUSION (PSF);  Surgeon: Venetia Night, MD;  Location: [facility] ORS;  Service: Neurosurgery;   Laterality: N/A;   BACK SURGERY  12/2022   EPIDURAL BLOCK INJECTION     great toe Left    straightened and pinned   LUMBAR LAMINECTOMY/DECOMPRESSION MICRODISCECTOMY Right 06/17/2020   Procedure: RIGHT L2-3 MICRODISCECTOMY;  Surgeon: Venetia Night, MD;  Location: [facility] ORS;  Service: Neurosurgery;  Laterality: Right;   NECK SURGERY     ROTATOR CUFF REPAIR Right    Family History  Problem Relation Age of Onset   Heart attack Mother    Mental illness Mother    Bone cancer Brother    Heart attack Brother    Social History   Socioeconomic History   Marital status: Widowed    Spouse name: Not on file   Number of children: Not on file   Years of education: Not on file   Highest education level: Not on file  Occupational History   Not on file  Tobacco Use   Smoking status: Never   Smokeless tobacco: Never  Vaping Use   Vaping Use: Never used  Substance and Sexual Activity   Alcohol use: Never   Drug use: Never   Sexual activity: Not on file  Other Topics Concern   Not on file  Social History Narrative   Not on file   Social Determinants of Health   Financial Resource Strain: Low Risk  (06/05/2023)   Overall Financial Resource Strain (CARDIA)    Difficulty of Paying Living Expenses: Not hard at all  Food Insecurity: No Food Insecurity (06/05/2023)   Hunger Vital Sign    Worried About Running Out of Food in the Last Year: Never true    Ran Out of Food in the Last Year: Never true  Transportation Needs: No Transportation Needs (06/05/2023)   PRAPARE - Administrator, Civil Service (Medical): No    Lack of Transportation (Non-Medical): No  Physical Activity: Insufficiently Active (06/05/2023)   Exercise Vital Sign    Days of Exercise per Week: 2 days    Minutes of Exercise per Session: 60 min  Stress: No Stress Concern Present (06/05/2023)   [employer] of Occupational Health - Occupational Stress Questionnaire    Feeling of Stress : Not at all  Social  Connections: Moderately Integrated (06/05/2023)   Social Connection and Isolation Panel [NHANES]    Frequency of Communication with Friends and Family: More than three times a week    Frequency of Social Gatherings with Friends and Family: More than three times a week    Attends Religious Services: More than 4 times per year    Active Member of [employer] or Organizations: Yes    Attends Banker Meetings: More than 4 times per year    Marital Status: Widowed  Recent Concern: Social Connections - Moderately Isolated (06/05/2023)   Social Connection and Isolation Panel [NHANES]    Frequency of Communication with Friends and Family: More than three times a week  Frequency of Social Gatherings with Friends and Family: More than three times a week    Attends Religious Services: More than 4 times per year    Active Member of Clubs or Organizations: No    Attends Banker Meetings: Never    Marital Status: Widowed    Tobacco Counseling Counseling given: Not Answered   Clinical Intake:  Pre-visit preparation completed: Yes  Pain : No/denies pain     BMI - recorded: 24.37 Nutritional Risks: None Diabetes: No  How often do you need to have someone help you when you read instructions, pamphlets, or other written materials from your doctor or pharmacy?: 1 - Never  Interpreter Needed?: No  Information entered by :: C.Thomasine Klutts LPN   Activities of Daily Living    06/05/2023    3:35 PM 01/04/2023   12:20 PM  In your present state of health, do you have any difficulty performing the following activities:  Hearing? 1   Comment Left ear   Vision? 0   Difficulty concentrating or making decisions? 0   Walking or climbing stairs? 0   Dressing or bathing? 0   Doing errands, shopping? 0 0  Preparing Food and eating ? N   Using the Toilet? N   In the past six months, have you accidently leaked urine? N   Do you have problems with loss of bowel control? N   Managing your  Medications? N   Managing your Finances? N   Housekeeping or managing your Housekeeping? N     Patient Care Team: Doreene Nest, NP as PCP - General (Internal Medicine)  Indicate any recent Medical Services you may have received from other than [facility] providers in the past year (date may be approximate).     Assessment:   This is a routine wellness examination for [place].  Hearing/Vision screen Hearing Screening - Comments:: Hearing difficulties in left ear - no aids Vision Screening - Comments:: Glasses - [facility] - UTD on eye exams  Dietary issues and exercise activities discussed:     Goals Addressed             This Visit's Progress    Patient Stated       Increase exercise as tolerated.       Depression Screen    06/05/2023    3:33 PM 06/01/2022   11:31 AM 12/14/2021   12:12 PM 05/21/2020    2:03 PM 04/29/2020   10:27 AM  PHQ 2/9 Scores  PHQ - 2 Score 0 0 0 0 0  PHQ- 9 Score   0 0     Fall Risk    06/05/2023    3:35 PM 04/07/2023   10:37 AM 10/26/2022   11:27 AM 08/08/2022    9:42 AM 06/27/2022    9:36 AM  Fall Risk   Falls in the past year? 0 0 0 0 0  Number falls in past yr: 0 0     Injury with Fall? 0 0     Risk for fall due to : No Fall Risks No Fall Risks     Follow up Falls prevention discussed;Falls evaluation completed Falls evaluation completed       MEDICARE RISK AT HOME:  Medicare Risk at Home - 06/05/23 1535     Any stairs in or around the home? Yes    If so, are there any without handrails? No    Home free of loose throw rugs in walkways, pet beds,  electrical cords, etc? Yes    Adequate lighting in your home to reduce risk of falls? Yes    Life alert? No    Use of a cane, walker or w/c? No    Grab bars in the bathroom? No    Shower chair or bench in shower? Yes    Elevated toilet seat or a handicapped toilet? Yes             TIMED UP AND GO:  Was the test performed?  No    Cognitive Function:    05/21/2020    2:05 PM   MMSE - Mini Mental State Exam  Orientation to time 5  Orientation to Place 5  Registration 3  Attention/ Calculation 5  Recall 3  Language- repeat 1        06/05/2023    3:36 PM 06/01/2022   11:38 AM  6CIT Screen  What Year? 0 points 0 points  What month? 0 points 0 points  What time? 0 points 0 points  Count back from 20 0 points 0 points  Months in reverse 0 points 0 points  Repeat phrase 4 points 0 points  Total Score 4 points 0 points    Immunizations Immunization History  Administered Date(s) Administered   Influenza, Seasonal, Injecte, Preservative Fre 09/15/2011   PFIZER(Purple Top)SARS-COV-2 Vaccination 12/23/2019, 01/13/2020   PPD Test 09/16/2015, 09/16/2015   Pneumococcal Conjugate-13 12/22/2016   Pneumococcal Polysaccharide-23 11/29/2011   Tdap 07/16/2018    TDAP status: Up to date  Flu Vaccine status: Due, Education has been provided regarding the importance of this vaccine. Advised may receive this vaccine at local pharmacy or Health Dept. Aware to provide a copy of the vaccination record if obtained from local pharmacy or Health Dept. Verbalized acceptance and understanding.  Pneumococcal vaccine status: Up to date  Covid-19 vaccine status: Information provided on how to obtain vaccines.   Qualifies for Shingles Vaccine? Yes   Zostavax completed No   Shingrix Completed?: No.    Education has been provided regarding the importance of this vaccine. Patient has been advised to call insurance company to determine out of pocket expense if they have not yet received this vaccine. Advised may also receive vaccine at local pharmacy or Health Dept. Verbalized acceptance and understanding.  Screening Tests Health Maintenance  Topic Date Due   Hepatitis C Screening  Never done   COVID-19 Vaccine (3 - Pfizer risk series) 02/10/2020   Zoster Vaccines- Shingrix (1 of 2) 07/08/2023 (Originally 07/30/1965)   INFLUENZA VACCINE  06/29/2023   Medicare Annual Wellness (AWV)   06/04/2024   DTaP/Tdap/Td (2 - Td or Tdap) 07/16/2028   Pneumonia Vaccine 14+ Years old  Completed   DEXA SCAN  Completed   HPV VACCINES  Aged Out    Health Maintenance  Health Maintenance Due  Topic Date Due   Hepatitis C Screening  Never done   COVID-19 Vaccine (3 - Pfizer risk series) 02/10/2020    Colorectal cancer screening: No longer required.   Mammogram status: Completed 02/18/22. Repeat every year Pt stated she will call for appointment.  Bone Scan - Pt stated she will call for appointment  Lung Cancer Screening: (Low Dose CT Chest recommended if Age 30-80 years, 20 pack-year currently smoking OR have quit w/in 15years.) does not qualify.   Lung Cancer Screening Referral: no  Additional Screening:  Hepatitis C Screening: does qualify; Completed Due at next OV  Vision Screening: Recommended annual ophthalmology exams for early detection of  glaucoma and other disorders of the eye. Is the patient up to date with their annual eye exam?  Yes  Who is the provider or what is the name of the office in which the patient attends annual eye exams? [facility] If pt is not established with a provider, would they like to be referred to a provider to establish care? Yes .   Dental Screening: Recommended annual dental exams for proper oral hygiene    Community Resource Referral / Chronic Care Management: CRR required this visit?  No   CCM required this visit?  No     Plan:     I have personally reviewed and noted the following in the patient's chart:   Medical and social history Use of alcohol, tobacco or illicit drugs  Current medications and supplements including opioid prescriptions. Patient is not currently taking opioid prescriptions. Functional ability and status Nutritional status Physical activity Advanced directives List of other physicians Hospitalizations, surgeries, and ER visits in previous 12 months Vitals Screenings to include cognitive, depression,  and falls Referrals and appointments  In addition, I have reviewed and discussed with patient certain preventive protocols, quality metrics, and best practice recommendations. A written personalized care plan for preventive services as well as general preventive health recommendations were provided to patient.     Maryan Puls, LPN   4/0/9811   After Visit Summary: (MyChart) Due to this being a telephonic visit, the after visit summary with patients personalized plan was offered to patient via MyChart   Nurse Notes: Order placed for mammogram and dexa scan.

## 2023-06-28 DIAGNOSIS — M5031 Other cervical disc degeneration,  high cervical region: Secondary | ICD-10-CM | POA: Diagnosis not present

## 2023-06-28 DIAGNOSIS — M0579 Rheumatoid arthritis with rheumatoid factor of multiple sites without organ or systems involvement: Secondary | ICD-10-CM | POA: Diagnosis not present

## 2023-06-28 DIAGNOSIS — M545 Low back pain, unspecified: Secondary | ICD-10-CM | POA: Diagnosis not present

## 2023-06-28 DIAGNOSIS — M47812 Spondylosis without myelopathy or radiculopathy, cervical region: Secondary | ICD-10-CM | POA: Diagnosis not present

## 2023-06-28 DIAGNOSIS — G629 Polyneuropathy, unspecified: Secondary | ICD-10-CM | POA: Diagnosis not present

## 2023-06-28 DIAGNOSIS — M479 Spondylosis, unspecified: Secondary | ICD-10-CM | POA: Diagnosis not present

## 2023-06-28 DIAGNOSIS — Z79899 Other long term (current) drug therapy: Secondary | ICD-10-CM | POA: Diagnosis not present

## 2023-07-03 ENCOUNTER — Telehealth: Payer: Self-pay | Admitting: Primary Care

## 2023-07-03 NOTE — Telephone Encounter (Signed)
I spoke with pt; pt said 1 wk ago she fell and hit lt side of head on night stand; pt said she did not lose consciousness but it took her a few mins to get up. Pt said she does  not have H/A but head on lt side is sore if she pushes on head. Pt has not had dizziness, no vision changes and pt said she wanted to get checked out to make sure she is not bleeding in head. Pt said she is on her way to work and does not want to go to [facility] or ED. Pt said would keep appt already scheduled with Dr Ermalene Searing on 07/04/23. If pt condition changes or worsens prior to Dr Ermalene Searing appt pt will go to ED. Sending note to dr Ermalene Searing who is out of office and [place] pool.

## 2023-07-03 NOTE — Telephone Encounter (Signed)
Unable to reach pt by phone; no answer and mailbox is full. Will try to contact pt again later.

## 2023-07-03 NOTE — Telephone Encounter (Signed)
FYI: This call has been transferred to Access Nurse. Once the result note has been entered staff can address the message at that time.  Patient called in with the following symptoms:  Red Word: head pain/soreness from falling    Please advise at Mobile [phone] (mobile)  Message is routed to Provider Pool and [facility] Triage     Pt called stating about a week ago, she fell & hit her head. Pt states since then, the left side of her head as been painful and sore. Pt stated she didn't visit the ER or UC after falling. Scheduled pt with Dr. Ermalene Searing on tomorrow, 8/6 @ 2:00 pm. Pt declined triage. Call back # [phone]

## 2023-07-04 ENCOUNTER — Ambulatory Visit (INDEPENDENT_AMBULATORY_CARE_PROVIDER_SITE_OTHER): Payer: Medicare HMO | Admitting: Family Medicine

## 2023-07-04 ENCOUNTER — Encounter: Payer: Self-pay | Admitting: Family Medicine

## 2023-07-04 VITALS — BP 130/72 | HR 97 | Temp 97.7°F | Ht 64.0 in | Wt 129.0 lb

## 2023-07-04 DIAGNOSIS — S0990XA Unspecified injury of head, initial encounter: Secondary | ICD-10-CM

## 2023-07-04 DIAGNOSIS — E785 Hyperlipidemia, unspecified: Secondary | ICD-10-CM | POA: Diagnosis not present

## 2023-07-04 MED ORDER — PRAVASTATIN SODIUM 40 MG PO TABS: 40.0000 mg | ORAL_TABLET | Freq: Every day | ORAL | 3 refills | Status: DC

## 2023-07-04 NOTE — Telephone Encounter (Signed)
Noted  

## 2023-07-04 NOTE — Progress Notes (Signed)
Patient ID: Casey [name], female    DOB: January 15, 1946, 77 y.o.   MRN: [id number]  This visit was conducted in person.  BP 130/72 (BP Location: Right Arm, Patient Position: Sitting, Cuff Size: Normal)   Pulse 97   Temp 97.7 F (36.5 C) (Temporal)   Ht 5\' 4"  (1.626 m)   Wt 129 lb (58.5 kg)   SpO2 99%   BMI 22.14 kg/m    CC:  Chief Complaint  Patient presents with   Fall    X 1 week ago   Hit Head on Night Stand    Subjective:   HPI: Casey [name] is a 77 y.o. female patient of Graylon Gunning with history of rheumatoid arthritis, chronic pain syndrome and osteoporosis presenting on 07/04/2023 for Fall (X 1 week ago) and Hit Head on Night Stand    She reports a fall 1 week ago. She had an accidental fall when getting into bed.  No proceeding symptom.  Hit left posterior head on nightstand.  Felt dazed  for few minutes, no LOC.   Area has been sore since... to touch. No headache.  Yesterday left arm felt funny, not weak, no numbness.  Occ zinging funny sensation in head at site of injury.  No  new vision change ( is missing her glasses)   No N/V. No vertigo.   She is on etodolac, occ using.  She is not on any blood thinner.  Relevant past medical, surgical, family and social history reviewed and updated as indicated. Interim medical history since our last visit reviewed. Allergies and medications reviewed and updated. Outpatient Medications Prior to Visit  Medication Sig Dispense Refill   acetaminophen (TYLENOL) 500 MG tablet Take 2 tablets (1,000 mg total) by mouth every 6 (six) hours as needed for mild pain. 30 tablet 0   Adalimumab 40 MG/0.8ML PNKT Inject 0.8 mLs into the skin every 14 (fourteen) days.     cyanocobalamin (VITAMIN B12) 1000 MCG tablet Take 1,000 mcg by mouth daily.     ELDERBERRY PO Take 1 tablet by mouth daily. Vitamin C and zinc     etodolac (LODINE) 400 MG tablet Take 400 mg by mouth daily as needed for mild pain or moderate pain.     gabapentin  (NEURONTIN) 300 MG capsule TAKE 1 CAPSULE BY MOUTH THREE TIMES A DAY     Melatonin 10 MG TABS Take 10 mg by mouth at bedtime as needed (sleep.).     Multiple Vitamin (MULTIVITAMIN WITH MINERALS) TABS tablet Take 1 tablet by mouth daily. Centrum Silver     polyethylene glycol (MIRALAX / GLYCOLAX) 17 g packet Take 17 g by mouth daily as needed for mild constipation. 14 each 0   vitamin C (ASCORBIC ACID) 250 MG tablet Take 250 mg by mouth daily.     pravastatin (PRAVACHOL) 40 MG tablet Take 1 tablet (40 mg total) by mouth daily. For cholesterol 90 tablet 3   tiZANidine (ZANAFLEX) 4 MG tablet Take 1 tablet (4 mg total) by mouth every 6 (six) hours as needed for muscle spasms. (Patient not taking: Reported on 03/22/2023) 60 tablet 0   No facility-administered medications prior to visit.     Per HPI unless specifically indicated in ROS section below Review of Systems  Constitutional:  Negative for fatigue and fever.  HENT:  Negative for congestion.   Eyes:  Negative for pain.  Respiratory:  Negative for cough and shortness of breath.   Cardiovascular:  Negative for  chest pain, palpitations and leg swelling.  Gastrointestinal:  Negative for abdominal pain.  Genitourinary:  Negative for dysuria and vaginal bleeding.  Musculoskeletal:  Negative for back pain.  Neurological:  Negative for syncope, light-headedness and headaches.  Psychiatric/Behavioral:  Negative for dysphoric mood.    Objective:  BP 130/72 (BP Location: Right Arm, Patient Position: Sitting, Cuff Size: Normal)   Pulse 97   Temp 97.7 F (36.5 C) (Temporal)   Ht 5\' 4"  (1.626 m)   Wt 129 lb (58.5 kg)   SpO2 99%   BMI 22.14 kg/m   Wt Readings from Last 3 Encounters:  07/04/23 129 lb (58.5 kg)  06/05/23 142 lb (64.4 kg)  04/07/23 135 lb (61.2 kg)      Physical Exam Constitutional:      General: She is not in acute distress.    Appearance: Normal appearance. She is well-developed. She is not ill-appearing or  toxic-appearing.  HENT:     Head: Normocephalic. No raccoon eyes, abrasion, contusion, right periorbital erythema, left periorbital erythema or laceration.     Comments:  Are of impact ttp, no swelling or contusion    Right Ear: Hearing, tympanic membrane, ear canal and external ear normal. Tympanic membrane is not erythematous, retracted or bulging.     Left Ear: Hearing, tympanic membrane, ear canal and external ear normal. Tympanic membrane is not erythematous, retracted or bulging.     Nose: No mucosal edema or rhinorrhea.     Right Sinus: No maxillary sinus tenderness or frontal sinus tenderness.     Left Sinus: No maxillary sinus tenderness or frontal sinus tenderness.     Mouth/Throat:     Mouth: Oropharynx is clear and moist and mucous membranes are normal.     Pharynx: Uvula midline.  Eyes:     General: Lids are normal. Lids are everted, no foreign bodies appreciated.     Extraocular Movements: EOM normal.     Conjunctiva/sclera: Conjunctivae normal.     Pupils: Pupils are equal, round, and reactive to light.  Neck:     Thyroid: No thyroid mass or thyromegaly.     Vascular: No carotid bruit.     Trachea: Trachea normal.  Cardiovascular:     Rate and Rhythm: Normal rate and regular rhythm.     Pulses: Normal pulses.     Heart sounds: Normal heart sounds, S1 normal and S2 normal. No murmur heard.    No friction rub. No gallop.  Pulmonary:     Effort: Pulmonary effort is normal. No tachypnea or respiratory distress.     Breath sounds: Normal breath sounds. No decreased breath sounds, wheezing, rhonchi or rales.  Abdominal:     General: Bowel sounds are normal.     Palpations: Abdomen is soft.     Tenderness: There is no abdominal tenderness.  Musculoskeletal:     Cervical back: Normal range of motion and neck supple. No pain with movement, spinous process tenderness or muscular tenderness. Normal range of motion.  Skin:    General: Skin is warm, dry and intact.      Findings: No rash.  Neurological:     Mental Status: She is alert and oriented to person, place, and time.     Cranial Nerves: Cranial nerves 2-12 are intact.     Sensory: Sensation is intact.     Motor: Motor function is intact.  Psychiatric:        Mood and Affect: Mood is not anxious or depressed.  Speech: Speech normal.        Behavior: Behavior normal. Behavior is cooperative.        Thought Content: Thought content normal.        Cognition and Memory: Cognition and memory normal.        Judgment: Judgment normal.       Results for orders placed or performed in visit on 04/07/23  Lipid panel  Result Value Ref Range   Cholesterol 154 0 - 200 mg/dL   Triglycerides 29.5 0.0 - 149.0 mg/dL   HDL 28.41 >32.44 mg/dL   VLDL 01.0 0.0 - 27.2 mg/dL   LDL Cholesterol 80 0 - 99 mg/dL   Total CHOL/HDL Ratio 2    NonHDL 91.52   Hemoglobin A1c  Result Value Ref Range   Hgb A1c MFr Bld 5.7 4.6 - 6.5 %  Comprehensive metabolic panel  Result Value Ref Range   Sodium 139 135 - 145 mEq/L   Potassium 4.4 3.5 - 5.1 mEq/L   Chloride 103 96 - 112 mEq/L   CO2 30 19 - 32 mEq/L   Glucose, Bld 87 70 - 99 mg/dL   BUN 15 6 - 23 mg/dL   Creatinine, Ser 5.36 0.40 - 1.20 mg/dL   Total Bilirubin 0.4 0.2 - 1.2 mg/dL   Alkaline Phosphatase 69 39 - 117 U/L   AST 24 0 - 37 U/L   ALT 12 0 - 35 U/L   Total Protein 7.3 6.0 - 8.3 g/dL   Albumin 3.9 3.5 - 5.2 g/dL   GFR 64.40 >34.74 mL/min   Calcium 9.3 8.4 - 10.5 mg/dL  CBC  Result Value Ref Range   WBC 4.8 4.0 - 10.5 K/uL   RBC 4.02 3.87 - 5.11 Mil/uL   Platelets 221.0 150.0 - 400.0 K/uL   Hemoglobin 12.1 12.0 - 15.0 g/dL   HCT 25.9 56.3 - 87.5 %   MCV 90.9 78.0 - 100.0 fl   MCHC 33.3 30.0 - 36.0 g/dL   RDW 64.3 32.9 - 51.8 %    Assessment and Plan  Injury of head, initial encounter Assessment & Plan:  Acute 1 week ago.  No red flags. NO headache. Nml Neuro exam.  No indication for head CT.  Possible mild concussion, recommended  brain rest.   Return and ER precautions provided.  Reviewed S/S of intracranial hemmorage.. she will call with any headache or new neuro changes to proceed with head CT.    Hyperlipidemia, unspecified hyperlipidemia type -     Pravastatin Sodium; Take 1 tablet (40 mg total) by mouth daily. For cholesterol  Dispense: 90 tablet; Refill: 3    No follow-ups on file.   Kerby Nora, MD

## 2023-07-04 NOTE — Assessment & Plan Note (Signed)
Acute 1 week ago.  No red flags. NO headache. Nml Neuro exam.  No indication for head CT.  Possible mild concussion, recommended brain rest.   Return and ER precautions provided.  Reviewed S/S of intracranial hemmorage.. she will call with any headache or new neuro changes to proceed with head CT.

## 2023-07-05 ENCOUNTER — Ambulatory Visit
Admission: RE | Admit: 2023-07-05 | Discharge: 2023-07-05 | Disposition: A | Discharge status: Home / Self Care | Payer: Medicare HMO | Source: Ambulatory Visit | Attending: Family Medicine | Admitting: Family Medicine

## 2023-07-05 ENCOUNTER — Telehealth: Payer: Self-pay | Admitting: Primary Care

## 2023-07-05 DIAGNOSIS — I672 Cerebral atherosclerosis: Secondary | ICD-10-CM | POA: Diagnosis not present

## 2023-07-05 DIAGNOSIS — R519 Headache, unspecified: Secondary | ICD-10-CM | POA: Insufficient documentation

## 2023-07-05 DIAGNOSIS — S0990XA Unspecified injury of head, initial encounter: Secondary | ICD-10-CM | POA: Insufficient documentation

## 2023-07-05 NOTE — Telephone Encounter (Signed)
Patient called the office to ask Dr. Ermalene Searing to place the order for her to have a head ct as they discussed at visit yesterday. Patient says her head feels very sore and she wants to proceed with the CT. Sent to [place] who saw patient yesterday and [facility] pool for review

## 2023-07-05 NOTE — Telephone Encounter (Signed)
Ms. Northrip notified CT has been ordered.  STAT referrals notified.

## 2023-07-05 NOTE — Telephone Encounter (Signed)
Let patient know I have ordered the head CT as we discussed at yesterday's appointment to be done in [place].  Please let stat referral coordinator team know to get this scheduled ASAP.

## 2023-07-07 ENCOUNTER — Telehealth: Payer: Self-pay | Admitting: Primary Care

## 2023-07-07 NOTE — Telephone Encounter (Signed)
Patient called to check on the status of Head CT results being back.She would like a phone call to discuss if results are ready.

## 2023-07-07 NOTE — Telephone Encounter (Signed)
I am not sure what happened with the result note.  I reviewed this previously and added comments.  Please call her ASAP to let her know the head CT was within normal limits.  No sign of injury

## 2023-07-07 NOTE — Telephone Encounter (Signed)
Casey [name] notified by telephone that her CT showed no acute intracranial abnormality and no skull fracture.   Patient states understanding.

## 2023-07-11 DIAGNOSIS — M5442 Lumbago with sciatica, left side: Secondary | ICD-10-CM | POA: Diagnosis not present

## 2023-07-11 DIAGNOSIS — G8929 Other chronic pain: Secondary | ICD-10-CM | POA: Diagnosis not present

## 2023-07-11 DIAGNOSIS — R200 Anesthesia of skin: Secondary | ICD-10-CM | POA: Diagnosis not present

## 2023-07-11 DIAGNOSIS — R29898 Other symptoms and signs involving the musculoskeletal system: Secondary | ICD-10-CM | POA: Diagnosis not present

## 2023-07-11 DIAGNOSIS — M79672 Pain in left foot: Secondary | ICD-10-CM | POA: Diagnosis not present

## 2023-07-11 DIAGNOSIS — R202 Paresthesia of skin: Secondary | ICD-10-CM | POA: Diagnosis not present

## 2023-07-11 DIAGNOSIS — M79671 Pain in right foot: Secondary | ICD-10-CM | POA: Diagnosis not present

## 2023-07-11 DIAGNOSIS — M5441 Lumbago with sciatica, right side: Secondary | ICD-10-CM | POA: Diagnosis not present

## 2023-07-20 DIAGNOSIS — H2511 Age-related nuclear cataract, right eye: Secondary | ICD-10-CM | POA: Diagnosis not present

## 2023-07-21 DIAGNOSIS — H2512 Age-related nuclear cataract, left eye: Secondary | ICD-10-CM | POA: Diagnosis not present

## 2023-07-21 DIAGNOSIS — H25042 Posterior subcapsular polar age-related cataract, left eye: Secondary | ICD-10-CM | POA: Diagnosis not present

## 2023-07-21 DIAGNOSIS — H25012 Cortical age-related cataract, left eye: Secondary | ICD-10-CM | POA: Diagnosis not present

## 2023-07-27 DIAGNOSIS — D709 Neutropenia, unspecified: Secondary | ICD-10-CM | POA: Diagnosis not present

## 2023-08-18 DIAGNOSIS — Z1231 Encounter for screening mammogram for malignant neoplasm of breast: Secondary | ICD-10-CM | POA: Diagnosis not present

## 2023-08-18 LAB — HM MAMMOGRAPHY

## 2023-09-12 DIAGNOSIS — M79672 Pain in left foot: Secondary | ICD-10-CM | POA: Diagnosis not present

## 2023-09-12 DIAGNOSIS — M5442 Lumbago with sciatica, left side: Secondary | ICD-10-CM | POA: Diagnosis not present

## 2023-09-12 DIAGNOSIS — M79671 Pain in right foot: Secondary | ICD-10-CM | POA: Diagnosis not present

## 2023-09-12 DIAGNOSIS — R202 Paresthesia of skin: Secondary | ICD-10-CM | POA: Diagnosis not present

## 2023-09-12 DIAGNOSIS — G8929 Other chronic pain: Secondary | ICD-10-CM | POA: Diagnosis not present

## 2023-09-12 DIAGNOSIS — M5441 Lumbago with sciatica, right side: Secondary | ICD-10-CM | POA: Diagnosis not present

## 2023-09-12 DIAGNOSIS — R200 Anesthesia of skin: Secondary | ICD-10-CM | POA: Diagnosis not present

## 2023-10-31 DIAGNOSIS — M0579 Rheumatoid arthritis with rheumatoid factor of multiple sites without organ or systems involvement: Secondary | ICD-10-CM | POA: Diagnosis not present

## 2023-10-31 DIAGNOSIS — Z79899 Other long term (current) drug therapy: Secondary | ICD-10-CM | POA: Diagnosis not present

## 2024-01-12 ENCOUNTER — Ambulatory Visit
Admission: RE | Admit: 2024-01-12 | Discharge: 2024-01-12 | Disposition: A | Discharge status: Home / Self Care | Payer: Medicare HMO | Source: Ambulatory Visit | Attending: Primary Care | Admitting: Primary Care

## 2024-01-12 ENCOUNTER — Encounter: Payer: Self-pay | Admitting: Primary Care

## 2024-01-12 ENCOUNTER — Ambulatory Visit: Payer: Medicare HMO | Admitting: Primary Care

## 2024-01-12 VITALS — BP 136/82 | HR 105 | Temp 98.7°F | Ht 64.0 in | Wt 128.0 lb

## 2024-01-12 DIAGNOSIS — M5416 Radiculopathy, lumbar region: Secondary | ICD-10-CM

## 2024-01-12 DIAGNOSIS — M7918 Myalgia, other site: Secondary | ICD-10-CM

## 2024-01-12 DIAGNOSIS — G8929 Other chronic pain: Secondary | ICD-10-CM | POA: Diagnosis not present

## 2024-01-12 DIAGNOSIS — M160 Bilateral primary osteoarthritis of hip: Secondary | ICD-10-CM | POA: Diagnosis not present

## 2024-01-12 DIAGNOSIS — M549 Dorsalgia, unspecified: Secondary | ICD-10-CM | POA: Diagnosis not present

## 2024-01-12 DIAGNOSIS — H6122 Impacted cerumen, left ear: Secondary | ICD-10-CM | POA: Diagnosis not present

## 2024-01-12 DIAGNOSIS — Z981 Arthrodesis status: Secondary | ICD-10-CM | POA: Diagnosis not present

## 2024-01-12 DIAGNOSIS — M48061 Spinal stenosis, lumbar region without neurogenic claudication: Secondary | ICD-10-CM | POA: Diagnosis not present

## 2024-01-12 DIAGNOSIS — R051 Acute cough: Secondary | ICD-10-CM | POA: Insufficient documentation

## 2024-01-12 DIAGNOSIS — M2578 Osteophyte, vertebrae: Secondary | ICD-10-CM | POA: Diagnosis not present

## 2024-01-12 MED ORDER — AZITHROMYCIN 250 MG PO TABS: ORAL_TABLET | ORAL | 0 refills | Status: DC

## 2024-01-12 MED ORDER — BENZONATATE 200 MG PO CAPS: 200.0000 mg | ORAL_CAPSULE | Freq: Three times a day (TID) | ORAL | 0 refills | Status: DC | PRN

## 2024-01-12 NOTE — Assessment & Plan Note (Signed)
Left cerumen impaction identified on exam. Patient consented to irrigation of canal to left side.   Left canal irrigated. Patient tolerated well. TM's and canals post irrigation unremarkable.   Discussed home care instructions.

## 2024-01-12 NOTE — Assessment & Plan Note (Signed)
HPI representative of piriformis syndrome. Discussed this with patient today.  Recommended PT for which she declines.  Xrays of the lumbar spine and coccyx pending.   Discussed stretching exercises at home. Await results.

## 2024-01-12 NOTE — Assessment & Plan Note (Signed)
Symptoms likely viral, but given presentation, coupled with lack of improvement within 7 days, will treat.  Start Azithromycin antibiotics for infection. Take 2 tablets by mouth today, then 1 tablet daily for 4 additional days. Start Benzonatate capsules for cough. Take 1 capsule by mouth three times daily as needed for cough.  Follow up as needed.

## 2024-01-12 NOTE — Progress Notes (Signed)
Subjective:    Patient ID: Casey [name], female    DOB: Jun 18, 1946, 78 y.o.   MRN: [id number]  Cough Associated symptoms include a fever and myalgias. Pertinent negatives include no chills, headaches, postnasal drip or sore throat.    Casey [name] is a very pleasant 78 y.o. female with a history of chronic back pain, rheumatoid arthritis, osteoarthritis of knees, chronic hip pain, lumbar stenosis with neurogenic claudication who presents today to discuss buttock pain and cough.  1) Chronic Back Pain/Chronic Joint Pain: Following with rheumatology through [facility], last evaluated on 10/31/23. Has failed numerous treatments for RA including methotrexate PO and subcutaneous, Arava. Other medications such as Enbrel, Marylouise Stacks were cost prohibitive. She is currently managed on Rinvoq 15 mg daily. During her last office visit she was prescribed prednisone taper for 12 days.  Today she presents with bilateral buttock pain which began 1 year ago after her last back surgery. She describes her symptoms as tightness and pulling. She feels like she's sitting on something. Her pain is constant, worse with sitting. She denies numbness. Pain will radiate down to her ankles.   She discussed this with her surgeon last year who told her that her symptoms were not secondary to the surgery. She has tried stretching which helps only when performing the stretch.   2) Acute Cough: Symptom onset 6-7 days ago with dry cough. She has since developed chest congestion, low grade fevers, rhinorrhea.   She's been taking Mucinex, Theraflu with temporary improvement. Today she's feeling about the same.   She was exposed to her grandson who was sick and coughing around her.   Review of Systems  Constitutional:  Positive for fatigue and fever. Negative for chills.  HENT:  Positive for congestion. Negative for postnasal drip and sore throat.   Respiratory:  Positive for cough.   Musculoskeletal:  Positive for arthralgias,  back pain and myalgias.  Neurological:  Negative for headaches.         Past Medical History:  Diagnosis Date   Allergy    Anterolisthesis 06/17/2020   Chronic back pain    Herpes zoster without complication 10/09/2020   History of kidney stones    Lumbar facet arthropathy 10/31/2019   Nondisplaced fracture of right radial styloid process, initial encounter for closed fracture 08/06/2018   Osteoporosis    Rheumatoid arthritis (HCC)     Social History   Socioeconomic History   Marital status: Widowed    Spouse name: Not on file   Number of children: Not on file   Years of education: Not on file   Highest education level: Not on file  Occupational History   Not on file  Tobacco Use   Smoking status: Never   Smokeless tobacco: Never  Vaping Use   Vaping status: Never Used  Substance and Sexual Activity   Alcohol use: Never   Drug use: Never   Sexual activity: Not on file  Other Topics Concern   Not on file  Social History Narrative   Not on file   Social Drivers of Health   Financial Resource Strain: Low Risk  (06/05/2023)   Overall Financial Resource Strain (CARDIA)    Difficulty of Paying Living Expenses: Not hard at all  Food Insecurity: No Food Insecurity (06/05/2023)   Hunger Vital Sign    Worried About Running Out of Food in the Last Year: Never true    Ran Out of Food in the Last Year: Never true  Transportation Needs:  No Transportation Needs (06/05/2023)   PRAPARE - Administrator, Civil Service (Medical): No    Lack of Transportation (Non-Medical): No  Physical Activity: Insufficiently Active (06/05/2023)   Exercise Vital Sign    Days of Exercise per Week: 2 days    Minutes of Exercise per Session: 60 min  Stress: No Stress Concern Present (06/05/2023)   [employer] of Occupational Health - Occupational Stress Questionnaire    Feeling of Stress : Not at all  Social Connections: Moderately Integrated (06/05/2023)   Social Connection and  Isolation Panel [NHANES]    Frequency of Communication with Friends and Family: More than three times a week    Frequency of Social Gatherings with Friends and Family: More than three times a week    Attends Religious Services: More than 4 times per year    Active Member of [employer] or Organizations: Yes    Attends Banker Meetings: More than 4 times per year    Marital Status: Widowed  Recent Concern: Social Connections - Moderately Isolated (06/05/2023)   Social Connection and Isolation Panel [NHANES]    Frequency of Communication with Friends and Family: More than three times a week    Frequency of Social Gatherings with Friends and Family: More than three times a week    Attends Religious Services: More than 4 times per year    Active Member of [employer] or Organizations: No    Attends Banker Meetings: Never    Marital Status: Widowed  Intimate Partner Violence: Not At Risk (06/05/2023)   Humiliation, Afraid, Rape, and Kick questionnaire    Fear of Current or Ex-Partner: No    Emotionally Abused: No    Physically Abused: No    Sexually Abused: No    Past Surgical History:  Procedure Laterality Date   ANTERIOR LATERAL LUMBAR FUSION WITH PERCUTANEOUS SCREW 1 LEVEL N/A 06/17/2020   Procedure: L4-5 EXTREME LATERAL INTERBODY FUSION(XLIF)/POSTERIOR SPINAL FUSION (PSF);  Surgeon: Venetia Night, MD;  Location: [facility] ORS;  Service: Neurosurgery;  Laterality: N/A;   BACK SURGERY  12/2022   EPIDURAL BLOCK INJECTION     great toe Left    straightened and pinned   LUMBAR LAMINECTOMY/DECOMPRESSION MICRODISCECTOMY Right 06/17/2020   Procedure: RIGHT L2-3 MICRODISCECTOMY;  Surgeon: Venetia Night, MD;  Location: [facility] ORS;  Service: Neurosurgery;  Laterality: Right;   NECK SURGERY     ROTATOR CUFF REPAIR Right     Family History  Problem Relation Age of Onset   Heart attack Mother    Mental illness Mother    Bone cancer Brother    Heart attack Brother      Allergies  Allergen Reactions   Leflunomide Other (See Comments)    GI upset     Methotrexate Other (See Comments)    GI upset    Current Outpatient Medications on File Prior to Visit  Medication Sig Dispense Refill   acetaminophen (TYLENOL) 500 MG tablet Take 2 tablets (1,000 mg total) by mouth every 6 (six) hours as needed for mild pain. 30 tablet 0   cyanocobalamin (VITAMIN B12) 1000 MCG tablet Take 1,000 mcg by mouth daily.     ELDERBERRY PO Take 1 tablet by mouth daily. Vitamin C and zinc     etodolac (LODINE) 400 MG tablet Take 400 mg by mouth daily as needed for mild pain or moderate pain.     gabapentin (NEURONTIN) 300 MG capsule TAKE 1 CAPSULE BY MOUTH THREE TIMES A DAY  Melatonin 10 MG TABS Take 10 mg by mouth at bedtime as needed (sleep.).     Multiple Vitamin (MULTIVITAMIN WITH MINERALS) TABS tablet Take 1 tablet by mouth daily. Centrum Silver     polyethylene glycol (MIRALAX / GLYCOLAX) 17 g packet Take 17 g by mouth daily as needed for mild constipation. 14 each 0   pravastatin (PRAVACHOL) 40 MG tablet Take 1 tablet (40 mg total) by mouth daily. For cholesterol 90 tablet 3   Upadacitinib ER 15 MG TB24 Take by mouth.     vitamin C (ASCORBIC ACID) 250 MG tablet Take 250 mg by mouth daily.     No current facility-administered medications on file prior to visit.    BP 136/82   Pulse (!) 105   Temp 98.7 F (37.1 C) (Temporal)   Ht 5\' 4"  (1.626 m)   Wt 128 lb (58.1 kg)   SpO2 98%   BMI 21.97 kg/m  Objective:   Physical Exam Constitutional:      Appearance: She is ill-appearing.  HENT:     Right Ear: Tympanic membrane and ear canal normal.     Left Ear: There is impacted cerumen.     Nose: No mucosal edema.     Right Sinus: No maxillary sinus tenderness or frontal sinus tenderness.     Left Sinus: No maxillary sinus tenderness or frontal sinus tenderness.     Mouth/Throat:     Mouth: Mucous membranes are moist.  Eyes:     Conjunctiva/sclera:  Conjunctivae normal.  Cardiovascular:     Rate and Rhythm: Normal rate and regular rhythm.  Pulmonary:     Effort: Pulmonary effort is normal.     Breath sounds: Normal breath sounds. No wheezing or rhonchi.     Comments: Congested cough noted during visit Musculoskeletal:     Cervical back: Neck supple.  Skin:    General: Skin is warm and dry.           Assessment & Plan:  Chronic buttock pain Assessment & Plan: HPI representative of piriformis syndrome. Discussed this with patient today.  Recommended PT for which she declines.  Xrays of the lumbar spine and coccyx pending.   Discussed stretching exercises at home. Await results.   Orders: -     DG Sacrum/Coccyx  Chronic radicular lumbar pain Assessment & Plan: S/P lumbar fusion in February 2024.  Repeat xrays of lumbar spine pending today.   Orders: -     DG Lumbar Spine 2-3 Views  Impacted cerumen of left ear Assessment & Plan: Left cerumen impaction identified on exam. Patient consented to irrigation of canal to left side.   Left canal irrigated. Patient tolerated well. TM's and canals post irrigation unremarkable.   Discussed home care instructions.     Acute cough Assessment & Plan: Symptoms likely viral, but given presentation, coupled with lack of improvement within 7 days, will treat.  Start Azithromycin antibiotics for infection. Take 2 tablets by mouth today, then 1 tablet daily for 4 additional days. Start Benzonatate capsules for cough. Take 1 capsule by mouth three times daily as needed for cough.  Follow up as needed.  Orders: -     Azithromycin; Take 2 tablets by mouth today, then 1 tablet daily for 4 additional days.  Dispense: 6 tablet; Refill: 0 -     Benzonatate; Take 1 capsule (200 mg total) by mouth 3 (three) times daily as needed for cough.  Dispense: 15 capsule; Refill: 0  Doreene Nest, NP

## 2024-01-12 NOTE — Assessment & Plan Note (Signed)
S/P lumbar fusion in February 2024.  Repeat xrays of lumbar spine pending today.

## 2024-01-12 NOTE — Patient Instructions (Signed)
Start Azithromycin antibiotics for infection. Take 2 tablets by mouth today, then 1 tablet daily for 4 additional days.  Start Benzonatate capsules for cough. Take 1 capsule by mouth three times daily as needed for cough.  Complete xray(s) prior to leaving today. I will notify you of your results once received.  It was a pleasure to see you today!

## 2024-01-16 ENCOUNTER — Telehealth: Payer: Self-pay

## 2024-01-16 DIAGNOSIS — R051 Acute cough: Secondary | ICD-10-CM

## 2024-01-16 DIAGNOSIS — G8929 Other chronic pain: Secondary | ICD-10-CM

## 2024-01-16 NOTE — Telephone Encounter (Signed)
Has she been taking the benzonatate capsules that were prescribed for cough on Friday?  She can take these 3 times daily as needed.  Are these helping?  She can try Mucinex for chest congestion.  Not sure if she is already done so.

## 2024-01-16 NOTE — Telephone Encounter (Signed)
Copied from CRM [id number]. Topic: Clinical - Medication Question >> Jan 16, 2024  1:13 PM Taleah C wrote: Reason for CRM: pt called and stated that she saw Natalia Leatherwood on Friday for a bad cough, She explained that she is still having chest congestion and wanted to ask if she could send in some type of cough syrup. Please call and advise.

## 2024-01-17 ENCOUNTER — Ambulatory Visit: Payer: Self-pay | Admitting: Primary Care

## 2024-01-17 DIAGNOSIS — R000 Tachycardia, unspecified: Secondary | ICD-10-CM | POA: Diagnosis not present

## 2024-01-17 DIAGNOSIS — Z1152 Encounter for screening for COVID-19: Secondary | ICD-10-CM | POA: Diagnosis not present

## 2024-01-17 DIAGNOSIS — M810 Age-related osteoporosis without current pathological fracture: Secondary | ICD-10-CM | POA: Diagnosis not present

## 2024-01-17 DIAGNOSIS — M4316 Spondylolisthesis, lumbar region: Secondary | ICD-10-CM | POA: Diagnosis not present

## 2024-01-17 DIAGNOSIS — J101 Influenza due to other identified influenza virus with other respiratory manifestations: Secondary | ICD-10-CM | POA: Diagnosis not present

## 2024-01-17 DIAGNOSIS — Z791 Long term (current) use of non-steroidal anti-inflammatories (NSAID): Secondary | ICD-10-CM | POA: Diagnosis not present

## 2024-01-17 DIAGNOSIS — M5136 Other intervertebral disc degeneration, lumbar region with discogenic back pain only: Secondary | ICD-10-CM | POA: Diagnosis not present

## 2024-01-17 DIAGNOSIS — M160 Bilateral primary osteoarthritis of hip: Secondary | ICD-10-CM | POA: Diagnosis not present

## 2024-01-17 DIAGNOSIS — M791 Myalgia, unspecified site: Secondary | ICD-10-CM | POA: Diagnosis not present

## 2024-01-17 DIAGNOSIS — E785 Hyperlipidemia, unspecified: Secondary | ICD-10-CM | POA: Diagnosis not present

## 2024-01-17 DIAGNOSIS — M069 Rheumatoid arthritis, unspecified: Secondary | ICD-10-CM | POA: Diagnosis not present

## 2024-01-17 DIAGNOSIS — R059 Cough, unspecified: Secondary | ICD-10-CM | POA: Diagnosis not present

## 2024-01-17 MED ORDER — PREDNISONE 20 MG PO TABS: ORAL_TABLET | ORAL | 0 refills | Status: DC

## 2024-01-17 NOTE — Addendum Note (Signed)
Addended by: Doreene Nest on: 01/17/2024 02:30 PM   Modules accepted: Orders

## 2024-01-17 NOTE — Telephone Encounter (Signed)
Received call back from patient, she has since left the ED and is now requesting to have prednisone sent in to break up chest congestion. She states she was not given anything to take home for her respiratory symptoms. Patient states she was given cyclobenzaprine for her back pain.

## 2024-01-17 NOTE — Telephone Encounter (Signed)
Please call patient:  I do not have the x-ray results back as of yet.  The radiologist has not read them.  For her back pain and chest congestion we can try a course of prednisone medication.  She would take 2 pills daily in the morning for 5 days.  Does she want to try this?

## 2024-01-17 NOTE — Telephone Encounter (Signed)
Copied from CRM [id number]. Topic: Clinical - Red Word Triage >> Jan 17, 2024  8:29 AM Irine Seal wrote: [facility] that prompted transfer to Nurse Triage: pain in her buttocks while sitting, patient had imagine done Friday, she is calling today for  the results. Advised her that they are still being reviewed by the radiologist, and have not been released. She is wanting to know where she can go to be seen today for the pain   Chief Complaint: Buttocks pain Symptoms: pain Frequency: chronic Pertinent Negatives: Patient denies burning with urination, blood in urine, fevers Disposition: [] ED /[] Urgent Care (no appt availability in office) / [] Appointment(In office/virtual)/ []  [facility]/ [] Home Care/ [x] Refused Recommended Disposition /[] [place]/ []  Follow-up with PCP Additional Notes: Patient called and advised that her buttocks is still hurting and has been hurting for a very long time.  Patient wanted her imaging results from imaging she just had done.  Results/PCP notes were not found within her chart to give her any information at this time.  Patient was advised that if she if having severe pain, it is recommended that she be seen in the next 4 hours.  She said that she felt like it would be a waste of time if they results of her imaging were not back yet.  She is advised that she can be seen and someone could possibly help her with her pain until some results were back. She states that she has taken Extra Strength Tylenol and she is still having pain.  She states that she will just wait on her PCP to get the results and she is advised that if she gets worse to go to the emergency room.  Patient verbalized understanding and states she hopes to get a response from her PCP in the next couple of hours and if not she may end up going to [facility].   Reason for Disposition  [1] SEVERE back pain (e.g., excruciating, unable to do any normal activities) AND [2] not improved 2 hours  after pain medicine  Answer Assessment - Initial Assessment Questions 1. ONSET: "When did the pain begin?"      "A long time" 2. LOCATION: "Where does it hurt?" (upper, mid or lower back)     Buttocks sacral area 3. SEVERITY: "How bad is the pain?"  (e.g., Scale 1-10; mild, moderate, or severe)   - MILD (1-3): Doesn't interfere with normal activities.    - MODERATE (4-7): Interferes with normal activities or awakens from sleep.    - SEVERE (8-10): Excruciating pain, unable to do any normal activities.      "10 it draws me up in a ball" 4. PATTERN: "Is the pain constant?" (e.g., yes, no; constant, intermittent)      "Im sitting on something all the time but it "draws" sometimes" 5. RADIATION: "Does the pain shoot into your legs or somewhere else?"     Down legs sometimes 6. CAUSE:  "What do you think is causing the back pain?"      Unknown--recent imaging 7. BACK OVERUSE:  "Any recent lifting of heavy objects, strenuous work or exercise?"     no 8. MEDICINES: "What have you taken so far for the pain?" (e.g., nothing, acetaminophen, NSAIDS)     Yes  tylenol and another medicine 9. NEUROLOGIC SYMPTOMS: "Do you have any weakness, numbness, or problems with bowel/bladder control?"     no 10. OTHER SYMPTOMS: "Do you have any other symptoms?" (e.g., fever,  abdomen pain, burning with urination, blood in urine)       no  Protocols used: Back Pain-A-AH

## 2024-01-17 NOTE — Telephone Encounter (Signed)
 Noted

## 2024-01-17 NOTE — Telephone Encounter (Signed)
Called and spoke with patient, she states she was seen at [facility] just now and was diagnosed with the Flu, they are treating with medication. Advised patient we will reach out once we receive xray results.

## 2024-01-17 NOTE — Telephone Encounter (Signed)
 See other phone note

## 2024-01-17 NOTE — Telephone Encounter (Signed)
Called and spoke with patient, she has been taking benzonatate capsules three times a day with no relief. She states she is coughing up very thick yellowish mucus. She says she has tried Mucinex for the chest congestion and that is not breaking anything up.

## 2024-01-17 NOTE — Telephone Encounter (Signed)
 Noted.  Prescription sent to pharmacy.

## 2024-01-25 ENCOUNTER — Telehealth: Payer: Self-pay

## 2024-01-25 NOTE — Telephone Encounter (Signed)
 Called patient and reviewed all information. Patient verbalized understanding. Will call if any further questions.

## 2024-01-25 NOTE — Telephone Encounter (Signed)
 Copied from CRM [id number]. Topic: General - Other >> Jan 25, 2024 10:19 AM Turkey A wrote: Reason for CRM: Patient would like results for her X-ray please call

## 2024-01-25 NOTE — Telephone Encounter (Signed)
 Casey [name], please call patient:  Let her know that we have not received the results of her x-rays.  The radiologist has not read them.  We will be in touch as soon as we receive the results.  Denny Peon, can we try to have these x-rays read this week?

## 2024-01-31 ENCOUNTER — Telehealth: Payer: Self-pay | Admitting: Primary Care

## 2024-01-31 DIAGNOSIS — Z791 Long term (current) use of non-steroidal anti-inflammatories (NSAID): Secondary | ICD-10-CM | POA: Diagnosis not present

## 2024-01-31 DIAGNOSIS — K6289 Other specified diseases of anus and rectum: Secondary | ICD-10-CM | POA: Diagnosis not present

## 2024-01-31 DIAGNOSIS — Z981 Arthrodesis status: Secondary | ICD-10-CM | POA: Diagnosis not present

## 2024-01-31 DIAGNOSIS — K5900 Constipation, unspecified: Secondary | ICD-10-CM | POA: Diagnosis not present

## 2024-01-31 DIAGNOSIS — M1990 Unspecified osteoarthritis, unspecified site: Secondary | ICD-10-CM | POA: Diagnosis not present

## 2024-01-31 DIAGNOSIS — M069 Rheumatoid arthritis, unspecified: Secondary | ICD-10-CM | POA: Diagnosis not present

## 2024-01-31 DIAGNOSIS — Z888 Allergy status to other drugs, medicaments and biological substances status: Secondary | ICD-10-CM | POA: Diagnosis not present

## 2024-01-31 DIAGNOSIS — R140 Abdominal distension (gaseous): Secondary | ICD-10-CM | POA: Diagnosis not present

## 2024-01-31 DIAGNOSIS — N1330 Unspecified hydronephrosis: Secondary | ICD-10-CM | POA: Diagnosis not present

## 2024-01-31 DIAGNOSIS — Z79899 Other long term (current) drug therapy: Secondary | ICD-10-CM | POA: Diagnosis not present

## 2024-01-31 NOTE — Telephone Encounter (Signed)
 Copied from CRM [id number]. Topic: Clinical - Lab/Test Results >> Jan 31, 2024  9:30 AM Aletta Edouard wrote: Reason for CRM: patient is calling in about xrays results she would like a call back  [phone]

## 2024-01-31 NOTE — Telephone Encounter (Signed)
 Reviewed xray results with patient. No questions at this time.

## 2024-02-03 DIAGNOSIS — K5900 Constipation, unspecified: Secondary | ICD-10-CM | POA: Diagnosis not present

## 2024-02-03 DIAGNOSIS — M069 Rheumatoid arthritis, unspecified: Secondary | ICD-10-CM | POA: Diagnosis not present

## 2024-02-03 DIAGNOSIS — M810 Age-related osteoporosis without current pathological fracture: Secondary | ICD-10-CM | POA: Diagnosis not present

## 2024-02-03 DIAGNOSIS — Z888 Allergy status to other drugs, medicaments and biological substances status: Secondary | ICD-10-CM | POA: Diagnosis not present

## 2024-02-03 DIAGNOSIS — Z79899 Other long term (current) drug therapy: Secondary | ICD-10-CM | POA: Diagnosis not present

## 2024-02-05 DIAGNOSIS — Z888 Allergy status to other drugs, medicaments and biological substances status: Secondary | ICD-10-CM | POA: Diagnosis not present

## 2024-02-05 DIAGNOSIS — M79606 Pain in leg, unspecified: Secondary | ICD-10-CM | POA: Diagnosis not present

## 2024-02-05 DIAGNOSIS — Z883 Allergy status to other anti-infective agents status: Secondary | ICD-10-CM | POA: Diagnosis not present

## 2024-02-05 DIAGNOSIS — M549 Dorsalgia, unspecified: Secondary | ICD-10-CM | POA: Diagnosis not present

## 2024-02-05 DIAGNOSIS — K5900 Constipation, unspecified: Secondary | ICD-10-CM | POA: Diagnosis not present

## 2024-02-05 DIAGNOSIS — G8929 Other chronic pain: Secondary | ICD-10-CM | POA: Diagnosis not present

## 2024-02-05 DIAGNOSIS — M069 Rheumatoid arthritis, unspecified: Secondary | ICD-10-CM | POA: Diagnosis not present

## 2024-02-09 ENCOUNTER — Ambulatory Visit: Admitting: Family

## 2024-02-13 ENCOUNTER — Ambulatory Visit: Admitting: Primary Care

## 2024-02-16 DIAGNOSIS — R194 Change in bowel habit: Secondary | ICD-10-CM | POA: Diagnosis not present

## 2024-02-16 DIAGNOSIS — N1330 Unspecified hydronephrosis: Secondary | ICD-10-CM | POA: Diagnosis not present

## 2024-02-23 DIAGNOSIS — Z79899 Other long term (current) drug therapy: Secondary | ICD-10-CM | POA: Diagnosis not present

## 2024-02-23 DIAGNOSIS — Z1159 Encounter for screening for other viral diseases: Secondary | ICD-10-CM | POA: Diagnosis not present

## 2024-02-23 DIAGNOSIS — M0579 Rheumatoid arthritis with rheumatoid factor of multiple sites without organ or systems involvement: Secondary | ICD-10-CM | POA: Diagnosis not present

## 2024-02-26 ENCOUNTER — Other Ambulatory Visit: Payer: Self-pay | Admitting: Nurse Practitioner

## 2024-02-26 ENCOUNTER — Telehealth: Payer: Self-pay | Admitting: Primary Care

## 2024-02-26 DIAGNOSIS — M48062 Spinal stenosis, lumbar region with neurogenic claudication: Secondary | ICD-10-CM | POA: Diagnosis not present

## 2024-02-26 NOTE — Telephone Encounter (Signed)
 Copied from CRM [id number]. Topic: General - Other >> Feb 26, 2024  3:06 PM Melissa C wrote: Reason for CRM: Patient would like her x-rays sent to [facility] for Dr. Mikal Plane. Phone number there is [phone], fax number  [phone]. If you have any questions for patient, she said that you please contact her and she can provide information. Thank you.

## 2024-02-26 NOTE — Telephone Encounter (Signed)
 Called and spoke with patient, advised she will need to pick up CD of xrays and take to provider she wants to have them.   LAB/XRAY: Patient would like copy of her most recent xrays on CD, and would like a call when ready to be picked up. Thanks!

## 2024-02-26 NOTE — Telephone Encounter (Signed)
 Reached out to patient to let her know that her disc was ready to pick up at the front desk.

## 2024-03-06 ENCOUNTER — Other Ambulatory Visit: Payer: Self-pay

## 2024-03-06 ENCOUNTER — Emergency Department: Admission: EM | Admit: 2024-03-06 | Discharge: 2024-03-06 | Disposition: A | Discharge status: Home / Self Care

## 2024-03-06 ENCOUNTER — Emergency Department

## 2024-03-06 DIAGNOSIS — M25561 Pain in right knee: Secondary | ICD-10-CM | POA: Diagnosis not present

## 2024-03-06 DIAGNOSIS — R262 Difficulty in walking, not elsewhere classified: Secondary | ICD-10-CM | POA: Diagnosis not present

## 2024-03-06 DIAGNOSIS — R5383 Other fatigue: Secondary | ICD-10-CM | POA: Diagnosis not present

## 2024-03-06 DIAGNOSIS — L02214 Cutaneous abscess of groin: Secondary | ICD-10-CM | POA: Diagnosis not present

## 2024-03-06 DIAGNOSIS — K5900 Constipation, unspecified: Secondary | ICD-10-CM | POA: Diagnosis not present

## 2024-03-06 DIAGNOSIS — J929 Pleural plaque without asbestos: Secondary | ICD-10-CM | POA: Diagnosis not present

## 2024-03-06 DIAGNOSIS — I700 Atherosclerosis of aorta: Secondary | ICD-10-CM | POA: Diagnosis not present

## 2024-03-06 DIAGNOSIS — G8929 Other chronic pain: Secondary | ICD-10-CM | POA: Insufficient documentation

## 2024-03-06 DIAGNOSIS — M85861 Other specified disorders of bone density and structure, right lower leg: Secondary | ICD-10-CM | POA: Diagnosis not present

## 2024-03-06 DIAGNOSIS — M1711 Unilateral primary osteoarthritis, right knee: Secondary | ICD-10-CM | POA: Diagnosis not present

## 2024-03-06 DIAGNOSIS — R531 Weakness: Secondary | ICD-10-CM | POA: Diagnosis not present

## 2024-03-06 DIAGNOSIS — R000 Tachycardia, unspecified: Secondary | ICD-10-CM | POA: Diagnosis not present

## 2024-03-06 LAB — URINALYSIS, ROUTINE W REFLEX MICROSCOPIC
Bacteria, UA: NONE SEEN
Bilirubin Urine: NEGATIVE
Glucose, UA: NEGATIVE mg/dL
Ketones, ur: NEGATIVE mg/dL
Leukocytes,Ua: NEGATIVE
Nitrite: NEGATIVE
Protein, ur: NEGATIVE mg/dL
Specific Gravity, Urine: 1.001 — ABNORMAL LOW (ref 1.005–1.030)
Squamous Epithelial / HPF: 0 /HPF (ref 0–5)
pH: 8 (ref 5.0–8.0)

## 2024-03-06 LAB — CBC
HCT: 35.6 % — ABNORMAL LOW (ref 36.0–46.0)
Hemoglobin: 11.7 g/dL — ABNORMAL LOW (ref 12.0–15.0)
MCH: 31.5 pg (ref 26.0–34.0)
MCHC: 32.9 g/dL (ref 30.0–36.0)
MCV: 95.7 fL (ref 80.0–100.0)
Platelets: 218 10*3/uL (ref 150–400)
RBC: 3.72 MIL/uL — ABNORMAL LOW (ref 3.87–5.11)
RDW: 12.1 % (ref 11.5–15.5)
WBC: 9 10*3/uL (ref 4.0–10.5)
nRBC: 0 % (ref 0.0–0.2)

## 2024-03-06 LAB — BASIC METABOLIC PANEL WITH GFR
Anion gap: 8 (ref 5–15)
BUN: 8 mg/dL (ref 8–23)
CO2: 26 mmol/L (ref 22–32)
Calcium: 8.8 mg/dL — ABNORMAL LOW (ref 8.9–10.3)
Chloride: 99 mmol/L (ref 98–111)
Creatinine, Ser: 0.7 mg/dL (ref 0.44–1.00)
GFR, Estimated: >60 mL/min (ref >60)
Glucose, Bld: 88 mg/dL (ref 70–99)
Potassium: 3.7 mmol/L (ref 3.5–5.1)
Sodium: 133 mmol/L — ABNORMAL LOW (ref 135–145)

## 2024-03-06 MED ORDER — BACITRACIN ZINC 500 UNIT/GM EX OINT
TOPICAL_OINTMENT | CUTANEOUS | Status: AC
  Filled 2024-03-06: qty 1.8

## 2024-03-06 MED ORDER — LIDOCAINE 5 % EX PTCH
1.0000 | MEDICATED_PATCH | CUTANEOUS | Status: DC
  Administered 2024-03-06: 1 via TRANSDERMAL
  Filled 2024-03-06: qty 1

## 2024-03-06 MED ORDER — CEPHALEXIN 250 MG PO CAPS
250.0000 mg | ORAL_CAPSULE | Freq: Four times a day (QID) | ORAL | 0 refills | Status: AC
Start: 2024-03-06 — End: 2024-03-13

## 2024-03-06 MED ORDER — ACETAMINOPHEN 325 MG PO TABS
650.0000 mg | ORAL_TABLET | Freq: Once | ORAL | Status: DC
  Filled 2024-03-06: qty 2

## 2024-03-06 MED ORDER — DOXYCYCLINE HYCLATE 100 MG PO TABS
100.0000 mg | ORAL_TABLET | Freq: Two times a day (BID) | ORAL | 0 refills | Status: AC
Start: 2024-03-06 — End: 2024-03-13

## 2024-03-06 MED ORDER — BACITRACIN ZINC 500 UNIT/GM EX OINT
TOPICAL_OINTMENT | Freq: Two times a day (BID) | CUTANEOUS | Status: DC
  Administered 2024-03-06: 1 via TOPICAL

## 2024-03-06 MED ORDER — LIDOCAINE-EPINEPHRINE (PF) 2 %-1:200000 IJ SOLN
10.0000 mL | Freq: Once | INTRAMUSCULAR | Status: AC
  Administered 2024-03-06: 10 mL via INTRADERMAL
  Filled 2024-03-06: qty 20

## 2024-03-06 NOTE — ED Notes (Signed)
..  The patient is A&OX4, wheeled out of ED via wheelchair, NAD. Pt verbalized understanding of d/c instructions, prescriptions and follow up care.

## 2024-03-06 NOTE — Discharge Instructions (Addendum)
 Your evaluation in the emergency department was notable for an abscess in your left groin.  This was drained, and a small strip of packing was placed--this can be removed in about 2 days.  Please do follow-up closely with your primary care doctor or OB/GYN provider for reevaluation of your wound.  I have started you on a course of antibiotics (Keflex and doxycycline) for a possible mild skin infection around your wound as well--please take the full course as prescribed.  With regard to your constipation, your exam is reassuring, and I recommend you continue with your usual constipation medications.  You can use Tylenol and lidocaine patches as needed for any ongoing knee discomfort.  Return to the emergency department with any new or worsening symptoms.

## 2024-03-06 NOTE — ED Provider Notes (Addendum)
 [facility] Provider Note    Event Date/Time   First MD Initiated Contact with Patient 03/06/24 1121     (approximate)   History   Weakness and Constipation  Pt here with weakness and constipation. Pt states she has taken Miralax, lactulose, and mineral oil with no relief. Pt states she was able to use the bathroom this morning but it had a yellow color. Pt states she she has been weak in both of her legs with swelling to her right knee. Pt denies fall or injury.   HPI Casey [name] is a 78 y.o. 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  Unclear when she had her last bowel movement, notes that she took all of her recently prescribed bowel regimen medications and then had a bowel movement this morning.  Sounds as if it may have just been a yellow liquid as opposed to solid stool.  She expressed concern about her need to take bowel medications to have bowel movements.  Denies any abdominal pain.  No black or bloody stool. -Unclear if having any urinary symptoms -No cough, fever, shortness of breath -Separately also states that her legs feel little weaker today than usual, remains ambulatory but is having notable pain in her right knee.  No proceeding trauma.  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        Physical Exam   Triage Vital Signs: ED Triage Vitals  Encounter Vitals Group     BP 03/06/24 1055 (!) 165/79     Systolic BP Percentile --      Diastolic BP Percentile --      Pulse Rate 03/06/24 1055 (!) 51     Resp 03/06/24 1107 17     Temp 03/06/24 1055 97.6 F (36.4 C)     Temp Source 03/06/24 1055 Oral     SpO2 03/06/24 1055 100 %     Weight 03/06/24 1107 128 lb 1.4  oz (58.1 kg)     Height 03/06/24 1107 5\' 4"  (1.626 m)     Head Circumference --      Peak Flow --      Pain Score 03/06/24 1107 10     Pain Loc --      Pain Education --      Exclude from Growth Chart --     Most recent vital signs: Vitals:   03/06/24 1130 03/06/24 1531  BP: (!) 152/82 (!) 170/105  Pulse: 92 96  Resp:  18  Temp:    SpO2: 100% 100%     General: Awake, no distress.  CV:  Good peripheral perfusion. RRR (HR 80s at time of my eval), RP 2+ Resp:  Normal effort.  Abd:  No distention. Nontender to deep palpation throughout Rectal:  No stool in rectal vault, no masses GU:   Normal external anatomy no patient does have erythematous fluctuant 2cm mass just lateral to left labia majora at 9 o'clock position.  Bedside ultrasound with fluid pocket extending about 1.5 cm deep, fortunately no deeper extension.  About 2 cm in diameter as well. BLE:   Full range of motion of all joints, no significant swelling appreciated, no deformities, no rashes.  No joint effusions.  Legs appear symmetric warm to touch.  Normal strength bilateral lower extremities, AG 5+ sec no drift    ED Results / Procedures / Treatments   Labs (all labs ordered are listed, but only abnormal results are displayed) Labs Reviewed  BASIC METABOLIC PANEL WITH GFR - Abnormal; Notable for the following components:      Result Value   Sodium 133 (*)    Calcium 8.8 (*)    All other components within normal limits  CBC - Abnormal; Notable for the following components:   RBC 3.72 (*)    Hemoglobin 11.7 (*)    HCT 35.6 (*)    All other components within normal limits  URINALYSIS, ROUTINE W REFLEX MICROSCOPIC - Abnormal; Notable for the following components:   Color, Urine COLORLESS (*)    APPearance CLEAR (*)    Specific Gravity, Urine 1.001 (*)    Hgb urine dipstick SMALL (*)    All other components within normal limits  AEROBIC/ANAEROBIC CULTURE W GRAM STAIN (SURGICAL/DEEP WOUND)  CBG MONITORING, ED      EKG  Ecg = sinus tachycardia, rate 102, no ST elevation depression, no significant repolarization abnormality, normal axis, normal intervals.  No evidence of ischemia nor arrhythmia on my read.   RADIOLOGY X-rays of chest and knees interpreted by myself with no evidence of acute pathology.  Radiology report reviewed.  PROCEDURES:  Critical Care performed: No  .Incision and Drainage  Date/Time: 03/06/2024 4:25 PM  Performed by: Marinell Blight, MD Authorized by: Marinell Blight, MD   Consent:    Consent obtained:  Verbal   Consent given by:  Patient   Risks, benefits, and alternatives were discussed: yes     Risks discussed:  Bleeding, incomplete drainage, damage to other organs, infection and pain   Alternatives discussed:  No treatment and delayed treatment Universal protocol:    Procedure explained and questions answered to patient or proxy's satisfaction: yes     Relevant documents present and verified: yes     Test results available : yes     Required blood products, implants, devices, and special equipment available: yes     Site/side marked: yes     Immediately prior to procedure, a time out was called: yes     Patient identity confirmed:  Verbally with patient and arm band Location:    Type:  Abscess   Size:  2   Location: L groin. Pre-procedure details:    Skin preparation:  Povidone-iodine Sedation:    Sedation type:  None Anesthesia:    Anesthesia method:  Local infiltration   Local anesthetic:  Lidocaine 2% WITH epi Procedure type:    Complexity:  Complex Procedure details:    Incision types:  Single straight   Wound management:  Probed and deloculated   Drainage:  Purulent   Drainage amount:  Copious   Wound treatment:  Wound left open   Packing materials:  1/4 in iodoform gauze   Amount 1/4" iodoform:  4cm Post-procedure details:    Procedure completion:  Tolerated well, no immediate complications    MEDICATIONS ORDERED IN ED: Medications   acetaminophen (TYLENOL) tablet 650 mg (650 mg Oral Not Given 03/06/24 1242)  lidocaine (LIDODERM) 5 % 1 patch (1 patch Transdermal Patch Applied 03/06/24 1242)  bacitracin ointment ( Topical Not Given 03/06/24 1602)  lidocaine-EPINEPHrine (XYLOCAINE W/EPI) 2 %-1:200000 (PF) injection 10 mL (10 mLs Intradermal Given by Other 03/06/24 1512)     IMPRESSION / MDM / ASSESSMENT AND PLAN / ED COURSE  I reviewed  the triage vital signs and the nursing notes.                              DDX/MDM/AP: Differential diagnosis includes, but is not limited to, benign constipation, do not suspect obstruction or other acute intra-abdominal pathology in this patient with multiple prior ED visits on chart reviewed for constipation as well as outpatient GI visits for this.  Very benign abdomen here, screening labs unremarkable, sounds as if she is passing stools--tempted manual disimpaction to ensure no stool ball in rectal vault, can also consider additional enema.  No indication for advanced imaging at this time.  With regard to her fatigue and vague bilateral lower extremity weakness, does not appear to have any abnormalities on eval of her lower extremities.  Does complain of right knee pain and is a somewhat limited historian so we will get screening x-ray to ensure no underlying fracture.  No red flags suggestive of spinal etiology.  Will use basic labs to screen for anemia or electrolyte abnormality and will attempt to collect urinalysis to ensure no underlying infection.  Separately, appears patient does have a small abscess or less likely cystic structure in her left groin just lateral to the labia majora, given location and size will require incision and drainage.  Anticipate discharge home if unremarkable workup.  Plan: -Labs  - ecg - XR R knee, chest x-ray to rule out underlying pneumonia -Attempted manual disimpaction, see physical exam --no stool in rectal vault -I&D of left groin abscess   Patient's  presentation is most consistent with acute complicated illness / injury requiring diagnostic workup.   ED course below.  Laboratory workup unremarkable.  Rectal exam reassuring, no stool in rectal vault.  Patient later clarified that she had a loose stool this morning as opposed to only liquid, suspect is having loose stools in the setting of using multiple laxatives that have been prescribed by multiple doctors for her chronic issue of constipation.  No evidence of acute pathology today, no clinical concern for obstruction.  Did perform I&D on left groin abscess with large amount of purulence extracted, deloculated.  Wound culture collected.  Started on Keflex and doxycycline for mild overlying edema consistent with cellulitis.  Plan discussed with patient and daughter at bedside who are in agreement with plan.  Wound packing iodoform gauze (about 4 cm) placed, plan for removal by PMD or OB/GYN in about 2 days.  ED return precautions in place.  Patient and family agree with plan.  Feeling much better after I&D.  Clinical Course as of 03/06/24 1627  Wed Mar 06, 2024  1127 BMP reviewed, overall unremarkable, mild hyponatremia   [MM]  1127 Reviewed, hemoglobin overall stable, no leukocytosis [MM]  1443 Urinalysis reviewed, unremarkable [MM]    Clinical Course User Index [MM] Marinell Blight, MD     FINAL CLINICAL IMPRESSION(S) / ED DIAGNOSES   Final diagnoses:  Constipation, unspecified constipation type  Abscess of left groin  Chronic pain of right knee     Rx / DC Orders   ED Discharge Orders          Ordered    doxycycline (VIBRA-TABS) 100 MG tablet  2 times daily        03/06/24 1537    cephALEXin (KEFLEX) 250 MG capsule  4 times daily        03/06/24 1537  Note:  This document was prepared using Dragon voice recognition software and may include unintentional dictation errors.   Marinell Blight, MD 03/06/24 1625    Marinell Blight, MD 03/06/24 [phone]

## 2024-03-06 NOTE — ED Triage Notes (Signed)
 Pt here with weakness and constipation. Pt states she has taken Miralax, lactulose, and mineral oil with no relief. Pt states she was able to use the bathroom this morning but it had a yellow color. Pt states she she has been weak in both of her legs with swelling to her right knee. Pt denies fall or injury.

## 2024-03-07 DIAGNOSIS — M25561 Pain in right knee: Secondary | ICD-10-CM | POA: Diagnosis not present

## 2024-03-08 ENCOUNTER — Ambulatory Visit: Admitting: Primary Care

## 2024-03-08 ENCOUNTER — Encounter: Payer: Self-pay | Admitting: Primary Care

## 2024-03-08 ENCOUNTER — Inpatient Hospital Stay: Admitting: Primary Care

## 2024-03-08 VITALS — BP 148/82 | HR 100 | Temp 97.5°F | Ht 64.0 in | Wt 130.0 lb

## 2024-03-08 DIAGNOSIS — L02214 Cutaneous abscess of groin: Secondary | ICD-10-CM | POA: Diagnosis not present

## 2024-03-08 DIAGNOSIS — K5909 Other constipation: Secondary | ICD-10-CM

## 2024-03-08 NOTE — Assessment & Plan Note (Signed)
 With recent ED visit. Reviewed ED notes, labs.  Incision site today appears to be healing.  No complications. Continue doxycycline 100 mg twice daily, cephalexin 250 mg 4 times daily. Follow-up as needed.

## 2024-03-08 NOTE — Assessment & Plan Note (Signed)
 Improving. Reviewed GI office notes from [facility] through [facility] from March 2025.   Continue laculose and mineral oil. Appointment printed out for [facility] in [place].  This will be closer for her to home.

## 2024-03-08 NOTE — Patient Instructions (Signed)
 Follow up with [facility] as scheduled

## 2024-03-08 NOTE — Progress Notes (Signed)
 Subjective:    Patient ID: Casey [name], female    DOB: 1946-05-30, 78 y.o.   MRN: [id number]  HPI  Casey [name] is a very pleasant 78 y.o. female with a history of rheumatoid arthritis, chronic back pain, chronic buttock pain, chronic hip pain, osteoporosis who presents today for ED follow-up.  Evaluated at [facility] ED on 03/06/2024 for symptoms of weakness to her bilateral lower extremities and constipation despite use of MiraLAX, lactulose, mineral oil.  During her stay in the ED she underwent lab work which was grossly negative.  An abscess was noted to her left groin, she underwent incision and drainage, no complications.  Wound was cultured.  There was no fecal matter in the rectal vault.  She underwent x-ray of the right knee which was without fracture.  Urinalysis was negative.  She was discharged home later that day with prescriptions for Keflex and doxycycline.  Since her ED visit she's been taking lactulose and mineral oil which is helping some with bowel movements. She is following with GI in [place], also has an appointment shceudled with [facility] in [place]. She is compliant to her doxycycline and cephalexin as prescribed. She denies fevers. She's noticed some leakage from the lanced abscess, also the string fell out from the packing.   Review of Systems  Constitutional:  Negative for fever.  Gastrointestinal:  Positive for constipation.  Musculoskeletal:  Positive for back pain.  Skin:  Positive for wound.         Past Medical History:  Diagnosis Date   Allergy    Anterolisthesis 06/17/2020   Chronic back pain    Herpes zoster without complication 10/09/2020   History of kidney stones    Lumbar facet arthropathy 10/31/2019   Nondisplaced fracture of right radial styloid process, initial encounter for closed fracture 08/06/2018   Osteoporosis    Rheumatoid arthritis (HCC)     Social History   Socioeconomic History   Marital status: Widowed     Spouse name: Not on file   Number of children: Not on file   Years of education: Not on file   Highest education level: Not on file  Occupational History   Not on file  Tobacco Use   Smoking status: Never   Smokeless tobacco: Never  Vaping Use   Vaping status: Never Used  Substance and Sexual Activity   Alcohol use: Never   Drug use: Never   Sexual activity: Not on file  Other Topics Concern   Not on file  Social History Narrative   Not on file   Social Drivers of Health   Financial Resource Strain: Low Risk  (06/05/2023)   Overall Financial Resource Strain (CARDIA)    Difficulty of Paying Living Expenses: Not hard at all  Food Insecurity: No Food Insecurity (06/05/2023)   Hunger Vital Sign    Worried About Running Out of Food in the Last Year: Never true    Ran Out of Food in the Last Year: Never true  Transportation Needs: No Transportation Needs (06/05/2023)   PRAPARE - Administrator, Civil Service (Medical): No    Lack of Transportation (Non-Medical): No  Physical Activity: Insufficiently Active (06/05/2023)   Exercise Vital Sign    Days of Exercise per Week: 2 days    Minutes of Exercise per Session: 60 min  Stress: No Stress Concern Present (06/05/2023)   [employer] of Occupational Health - Occupational Stress Questionnaire    Feeling of Stress :  Not at all  Social Connections: Moderately Integrated (06/05/2023)   Social Connection and Isolation Panel [NHANES]    Frequency of Communication with Friends and Family: More than three times a week    Frequency of Social Gatherings with Friends and Family: More than three times a week    Attends Religious Services: More than 4 times per year    Active Member of [employer] or Organizations: Yes    Attends Banker Meetings: More than 4 times per year    Marital Status: Widowed  Recent Concern: Social Connections - Moderately Isolated (06/05/2023)   Social Connection and Isolation Panel [NHANES]     Frequency of Communication with Friends and Family: More than three times a week    Frequency of Social Gatherings with Friends and Family: More than three times a week    Attends Religious Services: More than 4 times per year    Active Member of [employer] or Organizations: No    Attends Banker Meetings: Never    Marital Status: Widowed  Intimate Partner Violence: Not At Risk (06/05/2023)   Humiliation, Afraid, Rape, and Kick questionnaire    Fear of Current or Ex-Partner: No    Emotionally Abused: No    Physically Abused: No    Sexually Abused: No    Past Surgical History:  Procedure Laterality Date   ANTERIOR LATERAL LUMBAR FUSION WITH PERCUTANEOUS SCREW 1 LEVEL N/A 06/17/2020   Procedure: L4-5 EXTREME LATERAL INTERBODY FUSION(XLIF)/POSTERIOR SPINAL FUSION (PSF);  Surgeon: Venetia Night, MD;  Location: [facility] ORS;  Service: Neurosurgery;  Laterality: N/A;   BACK SURGERY  12/2022   EPIDURAL BLOCK INJECTION     great toe Left    straightened and pinned   LUMBAR LAMINECTOMY/DECOMPRESSION MICRODISCECTOMY Right 06/17/2020   Procedure: RIGHT L2-3 MICRODISCECTOMY;  Surgeon: Venetia Night, MD;  Location: [facility] ORS;  Service: Neurosurgery;  Laterality: Right;   NECK SURGERY     ROTATOR CUFF REPAIR Right     Family History  Problem Relation Age of Onset   Heart attack Mother    Mental illness Mother    Bone cancer Brother    Heart attack Brother     Allergies  Allergen Reactions   Leflunomide Other (See Comments)    GI upset     Methotrexate Other (See Comments)    GI upset    Current Outpatient Medications on File Prior to Visit  Medication Sig Dispense Refill   acetaminophen (TYLENOL) 500 MG tablet Take 2 tablets (1,000 mg total) by mouth every 6 (six) hours as needed for mild pain. 30 tablet 0   azithromycin (ZITHROMAX) 250 MG tablet Take 2 tablets by mouth today, then 1 tablet daily for 4 additional days. 6 tablet 0   benzonatate (TESSALON) 200 MG capsule  Take 1 capsule (200 mg total) by mouth 3 (three) times daily as needed for cough. 15 capsule 0   cephALEXin (KEFLEX) 250 MG capsule Take 1 capsule (250 mg total) by mouth 4 (four) times daily for 7 days. 28 capsule 0   cyanocobalamin (VITAMIN B12) 1000 MCG tablet Take 1,000 mcg by mouth daily.     doxycycline (VIBRA-TABS) 100 MG tablet Take 1 tablet (100 mg total) by mouth 2 (two) times daily for 7 days. 14 tablet 0   ELDERBERRY PO Take 1 tablet by mouth daily. Vitamin C and zinc     etodolac (LODINE) 400 MG tablet Take 400 mg by mouth daily as needed for mild pain or moderate pain.  Multiple Vitamin (MULTIVITAMIN WITH MINERALS) TABS tablet Take 1 tablet by mouth daily. Centrum Silver     polyethylene glycol (MIRALAX / GLYCOLAX) 17 g packet Take 17 g by mouth daily as needed for mild constipation. 14 each 0   pravastatin (PRAVACHOL) 40 MG tablet Take 1 tablet (40 mg total) by mouth daily. For cholesterol 90 tablet 3   Upadacitinib ER 15 MG TB24 Take by mouth.     vitamin C (ASCORBIC ACID) 250 MG tablet Take 250 mg by mouth daily.     gabapentin (NEURONTIN) 300 MG capsule TAKE 1 CAPSULE BY MOUTH THREE TIMES A DAY (Patient not taking: Reported on 03/08/2024)     Melatonin 10 MG TABS Take 10 mg by mouth at bedtime as needed (sleep.). (Patient not taking: Reported on 03/08/2024)     predniSONE (DELTASONE) 20 MG tablet Take 2 tablets by mouth once daily in the morning for 5 days. (Patient not taking: Reported on 03/08/2024) 10 tablet 0   No current facility-administered medications on file prior to visit.    BP (!) 148/82   Pulse 100   Temp (!) 97.5 F (36.4 C) (Temporal)   Ht 5\' 4"  (1.626 m)   Wt 130 lb (59 kg)   SpO2 95%   BMI 22.31 kg/m  Objective:   Physical Exam Cardiovascular:     Rate and Rhythm: Normal rate.  Pulmonary:     Effort: Pulmonary effort is normal.  Musculoskeletal:     Cervical back: Neck supple.  Skin:    General: Skin is warm and dry.     Findings: No erythema.      Comments: 0.25 cm oval open incision to left groin. Minimal swelling. Mild clear drainage. Non tender.   Neurological:     Mental Status: She is alert and oriented to person, place, and time.  Psychiatric:        Mood and Affect: Mood normal.           Assessment & Plan:  Chronic constipation Assessment & Plan: Improving. Reviewed GI office notes from [facility] through [facility] from March 2025.   Continue laculose and mineral oil. Appointment printed out for [facility] in [place].  This will be closer for her to home.   Abscess of groin, left Assessment & Plan: With recent ED visit. Reviewed ED notes, labs.  Incision site today appears to be healing.  No complications. Continue doxycycline 100 mg twice daily, cephalexin 250 mg 4 times daily. Follow-up as needed.         Doreene Nest, NP

## 2024-03-11 ENCOUNTER — Telehealth: Payer: Self-pay | Admitting: Primary Care

## 2024-03-11 NOTE — Telephone Encounter (Signed)
 Pt requesting refill on lactulose, however, per the chart this is not a current medication for this pt. RN attempted to reach pt. VM full. No contact made.   Copied from CRM [id number]. Topic: Clinical - Medication Refill >> Mar 11, 2024  1:08 PM Clydene Darner H wrote: Most Recent Primary Care Visit:  Provider: CLARK, KATHERINE K  Department: [facility]  Visit Type: OFFICE VISIT  Date: 01/12/2024  Medication: Lactulose   Has the patient contacted their pharmacy? No Patient stated that her PCP advised she would receive a refill of Lactulose solution once she finished her current supply. Patient reports she has now completed the medication.  (Agent: If no, request that the patient contact the pharmacy for the refill. If patient does not wish to contact the pharmacy document the reason why and proceed with request.) (Agent: If yes, when and what did the pharmacy advise?)  Is this the correct pharmacy for this prescription? Yes If no, delete pharmacy and type the correct one.  This is the patient's preferred pharmacy:  [facility] [phone] [facility] [place] - [address] [place] [address] Phone: [phone] Fax: [phone]   Has the prescription been filled recently? No  Is the patient out of the medication? Yes  Has the patient been seen for an appointment in the last year OR does the patient have an upcoming appointment? No  Can we respond through MyChart? No  Agent: Please be advised that Rx refills may take up to 3 business days. We ask that you follow-up with your pharmacy.

## 2024-03-11 NOTE — Telephone Encounter (Signed)
Unable to reach patient. Unable to leave voicemail.  

## 2024-03-11 NOTE — Telephone Encounter (Signed)
 She needs to call her GI doctor to request refills.

## 2024-03-12 LAB — AEROBIC/ANAEROBIC CULTURE W GRAM STAIN (SURGICAL/DEEP WOUND)

## 2024-03-13 DIAGNOSIS — N1330 Unspecified hydronephrosis: Secondary | ICD-10-CM | POA: Diagnosis not present

## 2024-03-13 NOTE — Telephone Encounter (Signed)
 Called and advised patient of Casey [name] message.

## 2024-03-25 ENCOUNTER — Ambulatory Visit: Admitting: Nurse Practitioner

## 2024-03-25 DIAGNOSIS — N3289 Other specified disorders of bladder: Secondary | ICD-10-CM | POA: Diagnosis not present

## 2024-03-25 DIAGNOSIS — N200 Calculus of kidney: Secondary | ICD-10-CM | POA: Diagnosis not present

## 2024-03-26 ENCOUNTER — Ambulatory Visit: Admitting: Podiatry

## 2024-03-26 DIAGNOSIS — B351 Tinea unguium: Secondary | ICD-10-CM | POA: Diagnosis not present

## 2024-03-26 MED ORDER — CICLOPIROX 8 % EX SOLN: Freq: Every day | CUTANEOUS | 0 refills | Status: DC

## 2024-03-26 NOTE — Progress Notes (Signed)
 Subjective:  Patient ID: Casey [name], female    DOB: August 05, 1946,  MRN: [id number]  Chief Complaint  Patient presents with   Nail Problem    Nail discoloration    78 y.o. female presents with the above complaint.  Patient presents with thickened likely dystrophic mycotic toenails x 10 mild pain on palpation she would like to discuss treatment options for it.  She does not want to.Aaron Aas  She wanted to get it evaluated.  She would like to discuss treatment options for nail fungus.  She does not want to oral or laser therapy.  She would like to discuss topical medication   Review of Systems: Negative except as noted in the HPI. Denies N/V/F/Ch.  Past Medical History:  Diagnosis Date   Allergy    Anterolisthesis 06/17/2020   Chronic back pain    Herpes zoster without complication 10/09/2020   History of kidney stones    Lumbar facet arthropathy 10/31/2019   Nondisplaced fracture of right radial styloid process, initial encounter for closed fracture 08/06/2018   Osteoporosis    Rheumatoid arthritis (HCC)     Current Outpatient Medications:    ciclopirox (PENLAC) 8 % solution, Apply topically at bedtime. Apply over nail and surrounding skin. Apply daily over previous coat. After seven (7) days, may remove with alcohol and continue cycle., Disp: 6.6 mL, Rfl: 0   acetaminophen  (TYLENOL ) 500 MG tablet, Take 2 tablets (1,000 mg total) by mouth every 6 (six) hours as needed for mild pain., Disp: 30 tablet, Rfl: 0   azithromycin  (ZITHROMAX ) 250 MG tablet, Take 2 tablets by mouth today, then 1 tablet daily for 4 additional days., Disp: 6 tablet, Rfl: 0   benzonatate  (TESSALON ) 200 MG capsule, Take 1 capsule (200 mg total) by mouth 3 (three) times daily as needed for cough., Disp: 15 capsule, Rfl: 0   cyanocobalamin  (VITAMIN B12) 1000 MCG tablet, Take 1,000 mcg by mouth daily., Disp: , Rfl:    ELDERBERRY PO, Take 1 tablet by mouth daily. Vitamin C  and zinc , Disp: , Rfl:    etodolac (LODINE) 400 MG  tablet, Take 400 mg by mouth daily as needed for mild pain or moderate pain., Disp: , Rfl:    gabapentin  (NEURONTIN ) 300 MG capsule, TAKE 1 CAPSULE BY MOUTH THREE TIMES A DAY (Patient not taking: Reported on 03/08/2024), Disp: , Rfl:    Melatonin 10 MG TABS, Take 10 mg by mouth at bedtime as needed (sleep.). (Patient not taking: Reported on 03/08/2024), Disp: , Rfl:    Multiple Vitamin (MULTIVITAMIN WITH MINERALS) TABS tablet, Take 1 tablet by mouth daily. Centrum Silver, Disp: , Rfl:    polyethylene glycol (MIRALAX  / GLYCOLAX ) 17 g packet, Take 17 g by mouth daily as needed for mild constipation., Disp: 14 each, Rfl: 0   pravastatin  (PRAVACHOL ) 40 MG tablet, Take 1 tablet (40 mg total) by mouth daily. For cholesterol, Disp: 90 tablet, Rfl: 3   predniSONE  (DELTASONE ) 20 MG tablet, Take 2 tablets by mouth once daily in the morning for 5 days. (Patient not taking: Reported on 03/08/2024), Disp: 10 tablet, Rfl: 0   Upadacitinib ER 15 MG TB24, Take by mouth., Disp: , Rfl:    vitamin C  (ASCORBIC ACID ) 250 MG tablet, Take 250 mg by mouth daily., Disp: , Rfl:   Social History   Tobacco Use  Smoking Status Never  Smokeless Tobacco Never    Allergies  Allergen Reactions   Leflunomide Other (See Comments)    GI upset  Methotrexate Other (See Comments)    GI upset   Objective:  There were no vitals filed for this visit. There is no height or weight on file to calculate BMI. Constitutional Well developed. Well nourished.  Vascular Dorsalis pedis pulses palpable bilaterally. Posterior tibial pulses palpable bilaterally. Capillary refill normal to all digits.  No cyanosis or clubbing noted. Pedal hair growth normal.  Neurologic Normal speech. Oriented to person, place, and time. Epicritic sensation to light touch grossly present bilaterally.  Dermatologic Nails thickened onychodystrophy mycotic toenails x 10 mild pain on palpation Skin within normal limits  Orthopedic: Normal joint ROM  without pain or crepitus bilaterally. No visible deformities. No bony tenderness.   Radiographs: None Assessment:   1. Onychomycosis due to dermatophyte   2. Nail fungus    Plan:  Patient was evaluated and treated and all questions answered.  Toenails x 10 onychomycosis -Educated the patient on the etiology of onychomycosis and various treatment options associated with improving the fungal load.  I explained to the patient that there is 3 treatment options available to treat the onychomycosis including topical, p.o., laser treatment.  Patient elected to go undergo topical application.  Penlac was sent to the pharmacy advised her to apply twice a day she states understand will do so immediately.   No follow-ups on file.

## 2024-03-28 ENCOUNTER — Emergency Department

## 2024-03-28 ENCOUNTER — Telehealth: Payer: Self-pay

## 2024-03-28 ENCOUNTER — Other Ambulatory Visit: Payer: Self-pay

## 2024-03-28 ENCOUNTER — Emergency Department
Admission: EM | Admit: 2024-03-28 | Discharge: 2024-03-28 | Disposition: A | Discharge status: Home / Self Care | Attending: Emergency Medicine | Admitting: Emergency Medicine

## 2024-03-28 DIAGNOSIS — K5900 Constipation, unspecified: Secondary | ICD-10-CM | POA: Insufficient documentation

## 2024-03-28 DIAGNOSIS — K5909 Other constipation: Secondary | ICD-10-CM

## 2024-03-28 DIAGNOSIS — G8929 Other chronic pain: Secondary | ICD-10-CM | POA: Insufficient documentation

## 2024-03-28 DIAGNOSIS — R109 Unspecified abdominal pain: Secondary | ICD-10-CM | POA: Diagnosis present

## 2024-03-28 MED ORDER — LACTULOSE 10 GM/15ML PO SOLN: 30.0000 g | Freq: Every day | ORAL | 0 refills | Status: DC | PRN

## 2024-03-28 MED ORDER — LACTULOSE 10 GM/15ML PO SOLN
30.0000 g | Freq: Once | ORAL | Status: AC
  Administered 2024-03-28: 30 g via ORAL
  Filled 2024-03-28: qty 60

## 2024-03-28 MED ORDER — ONDANSETRON 4 MG PO TBDP: 4.0000 mg | ORAL_TABLET | Freq: Four times a day (QID) | ORAL | 0 refills | Status: DC | PRN

## 2024-03-28 MED ORDER — DICYCLOMINE HCL 10 MG PO CAPS
20.0000 mg | ORAL_CAPSULE | Freq: Once | ORAL | Status: AC
  Administered 2024-03-28: 20 mg via ORAL
  Filled 2024-03-28: qty 2

## 2024-03-28 MED ORDER — ACETAMINOPHEN 500 MG PO TABS
1000.0000 mg | ORAL_TABLET | Freq: Once | ORAL | Status: AC
  Administered 2024-03-28: 1000 mg via ORAL
  Filled 2024-03-28: qty 2

## 2024-03-28 MED ORDER — DICYCLOMINE HCL 20 MG PO TABS: 20.0000 mg | ORAL_TABLET | Freq: Three times a day (TID) | ORAL | 0 refills | Status: DC | PRN

## 2024-03-28 NOTE — ED Provider Notes (Signed)
 [facility] Provider Note    Event Date/Time   First MD Initiated Contact with Patient 03/28/24 [phone]     (approximate)   History   Constipation   HPI  Casey [name] is a 78 y.o. female with history of rheumatoid arthritis, kidney stones, chronic constipation who presents to the emergency department with complaints of constipation.  States last normal bowel movement was 4 days ago but she was able to pass some stool prior to arrival to the ED.  Tried an enema at home prior to arrival without much relief.  Reports abdominal discomfort intermittently that she describes as feeling like her abdomen is tightening up.  No vomiting.  She is passing gas.  She reports she takes MiraLAX  and Metamucil daily.  Reports she was given a medication that started with an "L" previously which helped with her constipation.  States this was months ago.  She does have a GI doctor with [facility].  She states that she feels something moving within her abdomen and she is asking if I can check her for parasites today.  No recent travel.  She has not seen any abnormalities in her stool.  Patient reports that she had an outpatient CT of her abdomen pelvis 2 days ago but does not have the report.  States this was done at [facility].   History provided by patient.    Past Medical History:  Diagnosis Date   Allergy    Anterolisthesis 06/17/2020   Chronic back pain    Herpes zoster without complication 10/09/2020   History of kidney stones    Lumbar facet arthropathy 10/31/2019   Nondisplaced fracture of right radial styloid process, initial encounter for closed fracture 08/06/2018   Osteoporosis    Rheumatoid arthritis (HCC)     Past Surgical History:  Procedure Laterality Date   ANTERIOR LATERAL LUMBAR FUSION WITH PERCUTANEOUS SCREW 1 LEVEL N/A 06/17/2020   Procedure: L4-5 EXTREME LATERAL INTERBODY FUSION(XLIF)/POSTERIOR SPINAL FUSION (PSF);  Surgeon: Jodeen Munch, MD;  Location:  [facility] ORS;  Service: Neurosurgery;  Laterality: N/A;   BACK SURGERY  12/2022   EPIDURAL BLOCK INJECTION     great toe Left    straightened and pinned   LUMBAR LAMINECTOMY/DECOMPRESSION MICRODISCECTOMY Right 06/17/2020   Procedure: RIGHT L2-3 MICRODISCECTOMY;  Surgeon: Jodeen Munch, MD;  Location: [facility] ORS;  Service: Neurosurgery;  Laterality: Right;   NECK SURGERY     ROTATOR CUFF REPAIR Right     MEDICATIONS:  Prior to Admission medications   Medication Sig Start Date End Date Taking? Authorizing Provider  acetaminophen  (TYLENOL ) 500 MG tablet Take 2 tablets (1,000 mg total) by mouth every 6 (six) hours as needed for mild pain. 06/19/20   Zdeb, Christine, NP  azithromycin  (ZITHROMAX ) 250 MG tablet Take 2 tablets by mouth today, then 1 tablet daily for 4 additional days. 01/12/24   Clark, Katherine K, NP  benzonatate  (TESSALON ) 200 MG capsule Take 1 capsule (200 mg total) by mouth 3 (three) times daily as needed for cough. 01/12/24   Clark, Katherine K, NP  ciclopirox  (PENLAC ) 8 % solution Apply topically at bedtime. Apply over nail and surrounding skin. Apply daily over previous coat. After seven (7) days, may remove with alcohol and continue cycle. 03/26/24   Velma Ghazi, DPM  cyanocobalamin  (VITAMIN B12) 1000 MCG tablet Take 1,000 mcg by mouth daily.    [provider]  ELDERBERRY PO Take 1 tablet by mouth daily. Vitamin C  and zinc   [provider]  etodolac (LODINE) 400 MG tablet Take 400 mg by mouth daily as needed for mild pain or moderate pain.    [provider]  gabapentin  (NEURONTIN ) 300 MG capsule TAKE 1 CAPSULE BY MOUTH THREE TIMES A DAY Patient not taking: Reported on 03/08/2024 11/25/21   [provider]  Melatonin 10 MG TABS Take 10 mg by mouth at bedtime as needed (sleep.). Patient not taking: Reported on 03/08/2024    [provider]  Multiple Vitamin (MULTIVITAMIN WITH MINERALS) TABS tablet Take 1 tablet by mouth daily.  Centrum Silver    [provider]  polyethylene glycol (MIRALAX  / GLYCOLAX ) 17 g packet Take 17 g by mouth daily as needed for mild constipation. 06/19/20   Zdeb, Christine, NP  pravastatin  (PRAVACHOL ) 40 MG tablet Take 1 tablet (40 mg total) by mouth daily. For cholesterol 07/04/23   Bedsole, Amy E, MD  predniSONE  (DELTASONE ) 20 MG tablet Take 2 tablets by mouth once daily in the morning for 5 days. Patient not taking: Reported on 03/08/2024 01/17/24   Gabriel John, NP  Upadacitinib ER 15 MG TB24 Take by mouth. 08/15/23   [provider]  vitamin C  (ASCORBIC ACID ) 250 MG tablet Take 250 mg by mouth daily.    [provider]    Physical Exam   Triage Vital Signs: ED Triage Vitals  Encounter Vitals Group     BP 03/28/24 0144 (!) 154/85     Systolic BP Percentile --      Diastolic BP Percentile --      Pulse Rate 03/28/24 0144 (!) 106     Resp 03/28/24 0144 18     Temp 03/28/24 0145 98.3 F (36.8 C)     Temp Source 03/28/24 0145 Axillary     SpO2 03/28/24 0144 100 %     Weight 03/28/24 0142 130 lb (59 kg)     Height 03/28/24 0142 5\' 4"  (1.626 m)     Head Circumference --      Peak Flow --      Pain Score 03/28/24 0142 0     Pain Loc --      Pain Education --      Exclude from Growth Chart --     Most recent vital signs: Vitals:   03/28/24 0145 03/28/24 0529  BP:  (!) 167/98  Pulse:  92  Resp:  18  Temp: 98.3 F (36.8 C)   SpO2:  98%    CONSTITUTIONAL: Alert, responds appropriately to questions. Well-appearing; well-nourished, elderly, in no distress HEAD: Normocephalic, atraumatic EYES: Conjunctivae clear, pupils appear equal, sclera nonicteric ENT: normal nose; moist mucous membranes NECK: Supple, normal ROM CARD: RRR; S1 and S2 appreciated RESP: Normal chest excursion without splinting or tachypnea; breath sounds clear and equal bilaterally; no wheezes, no rhonchi, no rales, no hypoxia or respiratory distress, speaking full  sentences ABD/GI: Non-distended; soft, non-tender, no rebound, no guarding, no peritoneal signs RECTAL:  Normal rectal tone, no gross blood or melena, no hemorrhoids appreciated, nontender rectal exam, no fecal impaction.  No stool in the rectal vault. BACK: The back appears normal EXT: Normal ROM in all joints; no deformity noted, no edema SKIN: Normal color for age and race; warm; no rash on exposed skin NEURO: Moves all extremities equally, normal speech PSYCH: The patient's mood and manner are appropriate.   ED Results / Procedures / Treatments   LABS: (all labs ordered are listed, but only abnormal results are displayed) Labs  Reviewed - No data to display    EKG:     RADIOLOGY: My personal review and interpretation of imaging: Abdominal x-ray shows constipation.  No bowel obstruction.  I have personally reviewed all radiology reports.   DG Abd Portable 1 View Result Date: 03/28/2024 CLINICAL DATA:  Constipation EXAM: PORTABLE ABDOMEN - 1 VIEW COMPARISON:  None Available. FINDINGS: Nonobstructive bowel gas pattern. Moderate to large volume stool ascending, descending, and rectosigmoid colon. No free intraperitoneal gas. No organomegaly. No nephro or urolithiasis. L4-5 lumbar fusion with instrumentation has been performed. IMPRESSION: 1. Moderate to large volume stool.  No evidence of obstruction. Electronically Signed   By: Worthy Heads M.D.   On: 03/28/2024 02:39     PROCEDURES:  Critical Care performed: No     Procedures    IMPRESSION / MDM / ASSESSMENT AND PLAN / ED COURSE  I reviewed the triage vital signs and the nursing notes.    Patient here with complaints of intermittent abdominal pain, constipation.  No vomiting.  Is passing gas.  No abdominal distention.  Abdominal exam currently benign.   DIFFERENTIAL DIAGNOSIS (includes but not limited to):   Chronic constipation, low suspicion for bowel obstruction, ileus, colitis, diverticulitis, appendicitis  based on benign exam   Patient's presentation is most consistent with exacerbation of chronic illness.   PLAN: Patient here with history of constipation that has been well-documented in multiple previous notes and she states has been ongoing for "a long time".  She is on MiraLAX  and Metamucil daily.  She reports she tried an enema at home without relief.  Her abdominal exam here is benign.  She is tolerating p.o.  X-ray reviewed and interpreted by myself and the radiologist and shows constipation but no obstructive pattern.  Given patient's age, have offered to obtain labs, urine and CT of her abdomen pelvis.  Patient states that this has already been done as an outpatient and she is awaiting results from a CT scan done 2 days ago.  Unfortunately given this was done at [facility] and has not yet been read, I am unable to see these images or request that our radiologist read them.  Patient does not want to repeat this workup today.  She does have a gastroenterologist for follow-up but has not contacted them about her symptoms.  She states she was given a prescription medication previously that started with an "L" that helped her.  Inquired if this was lactulose  but patient is not sure.  Will give dose of lactulose  here and recommended increase fluid and fiber intake.  She also request that we check her for parasites today because she feels something moving in her abdomen.  I suspect that patient is feeling peristalsis.  We discussed that in order to check for ova and parasites that she would have to be able to provide a stool sample.  She states she is not able to do this.  I do not think this needs to be done emergently as I have low suspicion that patient has a parasitic infection.  She has not had any recent travel and has not seen any abnormalities in her stool.  Rectal exam is unremarkable with no fecal impaction.  There is no stool in the rectal vault and explained to patient this is likely why enema tonight  did not give her any significant relief.  We discussed the importance of outpatient follow-up.  Discussed return precautions.  I do not feel there is any indication for further emergent  workup for an acute exacerbation of her chronic illness.   MEDICATIONS GIVEN IN ED: Medications  lactulose  (CHRONULAC ) 10 GM/15ML solution 30 g (30 g Oral Given 03/28/24 0524)  dicyclomine  (BENTYL ) capsule 20 mg (20 mg Oral Given 03/28/24 0524)  acetaminophen  (TYLENOL ) tablet 1,000 mg (1,000 mg Oral Given 03/28/24 0524)     ED COURSE:  At this time, I do not feel there is any life-threatening condition present. I reviewed all nursing notes, vitals, pertinent previous records.  All lab and urine results, EKGs, imaging ordered have been independently reviewed and interpreted by myself.  I reviewed all available radiology reports from any imaging ordered this visit.  Based on my assessment, I feel the patient is safe to be discharged home without further emergent workup and can continue workup as an outpatient as needed. Discussed all findings, treatment plan as well as usual and customary return precautions.  They verbalize understanding and are comfortable with this plan.  Outpatient follow-up has been provided as needed.  All questions have been answered.    CONSULTS:  none   OUTSIDE RECORDS REVIEWED: Reviewed recent urology note on 03/13/2024.  Patient was found to have hydronephrosis on previous CT scan.  Urology ordered Noncon CT of the abdomen pelvis to evaluate for resolution of hydronephrosis.  Patient has no sign of urinary retention.  Postvoid residual was normal at the urologist office.  She denies any urinary symptoms.       FINAL CLINICAL IMPRESSION(S) / ED DIAGNOSES   Final diagnoses:  Chronic constipation     Rx / DC Orders   ED Discharge Orders          Ordered    lactulose  (CHRONULAC ) 10 GM/15ML solution  Daily PRN        03/28/24 0522    dicyclomine  (BENTYL ) 20 MG tablet  Every 8 hours  PRN        03/28/24 0522    ondansetron  (ZOFRAN -ODT) 4 MG disintegrating tablet  Every 6 hours PRN        03/28/24 0522             Note:  This document was prepared using Dragon voice recognition software and may include unintentional dictation errors.   Susana Gripp, Clover Dao, DO 03/28/24 [phone]

## 2024-03-28 NOTE — Telephone Encounter (Signed)
 Copied from CRM [id number]. Topic: Clinical - Medication Question >> Mar 28, 2024 10:05 AM Jethro Morrison wrote: Reason for CRM: PT CALLED WANTED TO KNOW IF SHE CAN BRING SOME OF HER STOOL IN TO BE TESTED. ADVISED I WOULD SOMEONE FROM THE CLINIC TO CONTACT HER.

## 2024-03-28 NOTE — Telephone Encounter (Signed)
 Patient return call and is available to take a call back once your available to do so.

## 2024-03-28 NOTE — Telephone Encounter (Signed)
 Spoke with patient advised this will require an OV to address. She stated she wanted to wait on results to come in from a recent scan she had done then she will reach back out for appt if she still want stool studies done.

## 2024-03-28 NOTE — ED Triage Notes (Signed)
 Pt to ED via POV c/o constipation. Pt has tried laxatives with no relief. Says she was here about a week ago for same

## 2024-03-28 NOTE — Discharge Instructions (Signed)
 I recommend that you increase your water and fiber intake. If you are not able to eat foods high in fiber, you may use Benefiber or Metamucil over-the-counter. I also recommend you use MiraLAX 1-2 times a day and Colace 100 mg twice a day to help with bowel movements. These medications are over the counter.  You may use other over-the-counter medications such as Dulcolax, Fleet enemas, magnesium citrate as needed for constipation. Please note that some of these medications may cause you to have abdominal cramping which is normal. If you develop severe abdominal pain, fever (temperature of 100.4 or higher), persistent vomiting, distention of your abdomen, unable to have a bowel movement for 5 days or are not passing gas, please return to the hospital.

## 2024-03-28 NOTE — Telephone Encounter (Signed)
 Unable to reach patient. Left voicemail to return call to our office.

## 2024-04-02 ENCOUNTER — Emergency Department
Admission: EM | Admit: 2024-04-02 | Discharge: 2024-04-02 | Disposition: A | Discharge status: Home / Self Care | Attending: Emergency Medicine | Admitting: Emergency Medicine

## 2024-04-02 ENCOUNTER — Emergency Department

## 2024-04-02 ENCOUNTER — Ambulatory Visit: Payer: Self-pay | Admitting: *Deleted

## 2024-04-02 DIAGNOSIS — E876 Hypokalemia: Secondary | ICD-10-CM | POA: Diagnosis not present

## 2024-04-02 DIAGNOSIS — R109 Unspecified abdominal pain: Secondary | ICD-10-CM

## 2024-04-02 DIAGNOSIS — K5900 Constipation, unspecified: Secondary | ICD-10-CM | POA: Diagnosis not present

## 2024-04-02 LAB — URINALYSIS, ROUTINE W REFLEX MICROSCOPIC
Bilirubin Urine: NEGATIVE
Glucose, UA: NEGATIVE mg/dL
Ketones, ur: 5 mg/dL — AB
Leukocytes,Ua: NEGATIVE
Nitrite: NEGATIVE
Protein, ur: NEGATIVE mg/dL
Specific Gravity, Urine: 1.006 (ref 1.005–1.030)
pH: 6 (ref 5.0–8.0)

## 2024-04-02 LAB — COMPREHENSIVE METABOLIC PANEL WITH GFR
ALT: 13 U/L (ref 0–44)
AST: 25 U/L (ref 15–41)
Albumin: 3.7 g/dL (ref 3.5–5.0)
Alkaline Phosphatase: 65 U/L (ref 38–126)
Anion gap: 10 (ref 5–15)
BUN: 9 mg/dL (ref 8–23)
CO2: 22 mmol/L (ref 22–32)
Calcium: 9.4 mg/dL (ref 8.9–10.3)
Chloride: 107 mmol/L (ref 98–111)
Creatinine, Ser: 0.62 mg/dL (ref 0.44–1.00)
GFR, Estimated: >60 mL/min (ref >60)
Glucose, Bld: 96 mg/dL (ref 70–99)
Potassium: 3.3 mmol/L — ABNORMAL LOW (ref 3.5–5.1)
Sodium: 139 mmol/L (ref 135–145)
Total Bilirubin: 0.6 mg/dL (ref 0.0–1.2)
Total Protein: 7.8 g/dL (ref 6.5–8.1)

## 2024-04-02 LAB — CBC
HCT: 35.1 % — ABNORMAL LOW (ref 36.0–46.0)
Hemoglobin: 11.7 g/dL — ABNORMAL LOW (ref 12.0–15.0)
MCH: 30.4 pg (ref 26.0–34.0)
MCHC: 33.3 g/dL (ref 30.0–36.0)
MCV: 91.2 fL (ref 80.0–100.0)
Platelets: 306 10*3/uL (ref 150–400)
RBC: 3.85 MIL/uL — ABNORMAL LOW (ref 3.87–5.11)
RDW: 12.7 % (ref 11.5–15.5)
WBC: 5.9 10*3/uL (ref 4.0–10.5)
nRBC: 0 % (ref 0.0–0.2)

## 2024-04-02 LAB — LIPASE, BLOOD: Lipase: 23 U/L (ref 11–51)

## 2024-04-02 MED ORDER — SMOG ENEMA
960.0000 mL | Freq: Once | RECTAL | Status: AC
  Administered 2024-04-02: 960 mL via RECTAL
  Filled 2024-04-02: qty 177

## 2024-04-02 MED ORDER — BENEFIBER DRINK MIX PO PACK: 1.0000 | PACK | Freq: Every day | ORAL | 0 refills | Status: AC

## 2024-04-02 MED ORDER — IOHEXOL 300 MG/ML  SOLN
100.0000 mL | Freq: Once | INTRAMUSCULAR | Status: AC | PRN
  Administered 2024-04-02: 100 mL via INTRAVENOUS

## 2024-04-02 NOTE — ED Provider Notes (Signed)
 [facility] Provider Note    Event Date/Time   First MD Initiated Contact with Patient 04/02/24 1659     (approximate)   History   Constipation   HPI  Casey [name] is a 78 y.o. female with history of chronic back pain, history of kidney stones, rheumatoid arthritis, constipation, presenting with constipation.  Patient states that last bowel movement was on Saturday.  She denies any nausea or vomiting or urinary symptoms, states that she is passing gas, states that she has been using laxatives without significant success.  She denies any new trauma or falls, denies abdominal pain, states that she feels aching in her bilateral lower buttocks.  No new weakness or numbness, no saddle anesthesia or incontinence.  On independent review, she was seen in early April for chronic constipation with her family medicine provider, has been seen by GI, is on lactulose  and mineral oil for her constipation.     Physical Exam   Triage Vital Signs: ED Triage Vitals  Encounter Vitals Group     BP 04/02/24 1536 (!) 158/85     Systolic BP Percentile --      Diastolic BP Percentile --      Pulse Rate 04/02/24 1536 100     Resp 04/02/24 1536 18     Temp 04/02/24 1536 98.6 F (37 C)     Temp Source 04/02/24 1536 Oral     SpO2 04/02/24 1536 100 %     Weight 04/02/24 1537 139 lb (63 kg)     Height 04/02/24 1537 5\' 4"  (1.626 m)     Head Circumference --      Peak Flow --      Pain Score 04/02/24 1540 10     Pain Loc --      Pain Education --      Exclude from Growth Chart --     Most recent vital signs: Vitals:   04/02/24 1902 04/02/24 2124  BP: (!) 175/96   Pulse: 96   Resp: 18   Temp:  98.6 F (37 C)  SpO2: 99%      General: Awake, no distress.  CV:  Good peripheral perfusion.  Resp:  Normal effort.  Abd:  No distention.  Mild diffuse tenderness without guarding Other:  No midline Spinal tenderness, no CVA tenderness, no saddle anesthesia, no focal  weakness or numbness, no tenderness to her bilateral gluteal region.   ED Results / Procedures / Treatments   Labs (all labs ordered are listed, but only abnormal results are displayed) Labs Reviewed  COMPREHENSIVE METABOLIC PANEL WITH GFR - Abnormal; Notable for the following components:      Result Value   Potassium 3.3 (*)    All other components within normal limits  CBC - Abnormal; Notable for the following components:   RBC 3.85 (*)    Hemoglobin 11.7 (*)    HCT 35.1 (*)    All other components within normal limits  URINALYSIS, ROUTINE W REFLEX MICROSCOPIC - Abnormal; Notable for the following components:   Color, Urine STRAW (*)    APPearance CLEAR (*)    Hgb urine dipstick SMALL (*)    Ketones, ur 5 (*)    Bacteria, UA RARE (*)    All other components within normal limits  LIPASE, BLOOD      RADIOLOGY On my independent interpretation, CT abdomen pelvis without obvious free air   PROCEDURES:  Critical Care performed: No  Procedures  MEDICATIONS ORDERED IN ED: Medications  iohexol  (OMNIPAQUE ) 300 MG/ML solution 100 mL (100 mLs Intravenous Contrast Given 04/02/24 2040)  sorbitol, magnesium  hydroxide, mineral oil, glycerin (SMOG) enema (960 mLs Rectal Given 04/02/24 2236)     IMPRESSION / MDM / ASSESSMENT AND PLAN / ED COURSE  I reviewed the triage vital signs and the nursing notes.                              Differential diagnosis includes, but is not limited to, constipation, partial bowel obstruction, mass.  Will get labs, CT abdomen pelvis.  Patient's presentation is most consistent with acute presentation with potential threat to life or bodily function.  Independent interpretation of labs and imaging below.  Clinical course as below.  CT showed a large stool burden, patient was given an enema, has some stool that came out with enema.  Discussed with patient about outpatient management of her constipation, will give her a prescription for Benefiber,  instructed her to increase her MiraLAX  from once a day to twice a day.  Encourage prune juice and hydration as well as eating more fruits and vegetables.  Otherwise considered but no indication for inpatient admission at this time, she is safe for outpatient management.  Will discharge with strict return precautions.     Clinical Course as of 04/02/24 2324  Tue Apr 02, 2024  1727 On independent review of labs, electrolytes are severely deranged, LFTs are normal, no leukocytosis, lipase is normal, UA is not consistent with UTI. [TT]  2109 CT ABDOMEN PELVIS W CONTRAST IMPRESSION: 1. Large colonic stool burden. Fecalization of the terminal ileum compatible with slow transit/constipation. No evidence of obstruction. 2. 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 Follow-up contrast-enhanced MRI or pancreas protocol CT in 6 months is recommended. 3. Aortic Atherosclerosis (ICD10-I70.0).   [TT]  2322 On reassessment patient was able to stool with enema. [TT]    Clinical Course User Index [TT] Drenda Gentle, Richard Champion, MD     FINAL CLINICAL IMPRESSION(S) / ED DIAGNOSES   Final diagnoses:  Abdominal pain, unspecified abdominal location  Constipation, unspecified constipation type     Rx / DC Orders   ED Discharge Orders          Ordered    Wheat Dextrin (BENEFIBER DRINK MIX) PACK  Daily        04/02/24 2324             Note:  This document was prepared using Dragon voice recognition software and may include unintentional dictation errors.    Shane Darling, MD 04/02/24 [phone]

## 2024-04-02 NOTE — Discharge Instructions (Addendum)
 Please take the Benefiber as prescribed, please also increase your MiraLAX  to twice a day and follow-up with your primary care doctor for further management of constipation.

## 2024-04-02 NOTE — ED Notes (Signed)
 Pharmacy called about SMOG. Per pharmacy they will be bringing it soon

## 2024-04-02 NOTE — Telephone Encounter (Signed)
 Copied from CRM [id number]. Topic: Clinical - Red Word Triage >> Apr 02, 2024  1:30 PM Rosamond Comes wrote: Red Word that prompted transfer to Nurse Triage: patient calling back call dropped. Patient is constipated and is in pain Reason for Disposition  [1] Rectal pain or fullness from fecal impaction (rectum full of stool) AND [2] NOT better after SITZ bath, suppository or enema  Answer Assessment - Initial Assessment Questions 1. STOOL PATTERN OR FREQUENCY: "How often do you have a bowel movement (BM)?"  (Normal range: 3 times a day to every 3 days)  "When was your last BM?"       I'm constipated.   I took a CT scan last Monday.   The urologist scheduled it.   It shows a lot of stool in my bowel.   I've been to the ED several times due to this.   My granddaughter gave me an enema a few minutes ago but it did not work.  Nothing came out.   I'm having pain.    Last BM was Sunday.   I'm bloated.   I felt it moving during the enema but nothing came out.    I used a Fleets enema.  I laid for an hour with it in me  and nothing came out.   At this point I referred her to the ED due to the pain and no results after retaining the enema for an hour.   She was agreeable to this plan. 2. STRAINING: "Do you have to strain to have a BM?"      Yes 3. RECTAL PAIN: "Does your rectum hurt when the stool comes out?" If Yes, ask: "Do you have hemorrhoids? How bad is the pain?"  (Scale 1-10; or mild, moderate, severe)     I went to the ED last week and they didn't do anything.    4. STOOL COMPOSITION: "Are the stools hard?"      Oh the occasion they come out they are hard. 5. BLOOD ON STOOLS: "Has there been any blood on the toilet tissue or on the surface of the BM?" If Yes, ask: "When was the last time?"     No 6. CHRONIC CONSTIPATION: "Is this a new problem for you?"  If No, ask: "How long have you had this problem?" (days, weeks, months)      Yes 7. CHANGES IN DIET OR HYDRATION: "Have there been any recent changes in  your diet?" "How much fluids are you drinking on a daily basis?"  "How much have you had to drink today?"     Not asked 8. MEDICINES: "Have you been taking any new medicines?" "Are you taking any narcotic pain medicines?" (e.g., Dilaudid , morphine , Percocet, Vicodin)     Not asked 9. LAXATIVES: "Have you been using any stool softeners, laxatives, or enemas?"  If Yes, ask "What, how often, and when was the last time?"     Yes Miralax  and stool softeners.  10. ACTIVITY:  "How much walking do you do every day?"  "Has your activity level decreased in the past week?"        Not asked 11. CAUSE: "What do you think is causing the constipation?"        I've had this problem a long time.  I've been to the ED several times due to constipation. 12. OTHER SYMPTOMS: "Do you have any other symptoms?" (e.g., abdomen pain, bloating, fever, vomiting)       Abd pain.  13.  MEDICAL HISTORY: "Do you have a history of hemorrhoids, rectal fissures, or rectal surgery or rectal abscess?"         Not asked 14. PREGNANCY: "Is there any chance you are pregnant?" "When was your last menstrual period?"       N/A due to age  Protocols used: Constipation-A-AH  Chief Complaint: Constipation   Symptoms: Last BM Sunday.   She is having abd pain and unable to move her bowels.   Her granddaughter gave her an enema a few min. Ago and she retained it for an hour without results.   I have referred her to the ED.   Agreeable to going. Frequency: This is a chronic problem for me Pertinent Negatives: Patient denies being able to move her bowels even after retaining an enema for an hour. Disposition: [x] ED /[] Urgent Care (no appt availability in office) / [] Appointment(In office/virtual)/ []  [facility]/ [] Home Care/ [] Refused Recommended Disposition /[] [facility]/ []  Follow-up with PCP Additional Notes: Referred to the ED.   Agreeable to going.

## 2024-04-02 NOTE — Telephone Encounter (Signed)
 Just now seeing this.  Patient has checked into the ED at [facility].  Will await ED notes.

## 2024-04-02 NOTE — ED Triage Notes (Signed)
 Pt presents to the ED POV from home for constipation. Pt was seen here 5 days ago for same and was seen prior to that for same as well. Pt reports that she had a large bowel movement on Sunday, but states that she had to take a lot of medications to have it. Pt reports 10/10 abdominal pain. Denies N/V.

## 2024-04-09 ENCOUNTER — Ambulatory Visit (INDEPENDENT_AMBULATORY_CARE_PROVIDER_SITE_OTHER): Admitting: Nurse Practitioner

## 2024-04-09 ENCOUNTER — Ambulatory Visit: Payer: Self-pay | Admitting: *Deleted

## 2024-04-09 VITALS — BP 150/90 | HR 80 | Temp 97.9°F | Ht 64.0 in | Wt 125.4 lb

## 2024-04-09 DIAGNOSIS — L0291 Cutaneous abscess, unspecified: Secondary | ICD-10-CM | POA: Diagnosis not present

## 2024-04-09 DIAGNOSIS — R030 Elevated blood-pressure reading, without diagnosis of hypertension: Secondary | ICD-10-CM | POA: Insufficient documentation

## 2024-04-09 DIAGNOSIS — R252 Cramp and spasm: Secondary | ICD-10-CM | POA: Diagnosis not present

## 2024-04-09 DIAGNOSIS — K5909 Other constipation: Secondary | ICD-10-CM | POA: Diagnosis not present

## 2024-04-09 LAB — COMPREHENSIVE METABOLIC PANEL WITH GFR
ALT: 13 U/L (ref 0–35)
AST: 32 U/L (ref 0–37)
Albumin: 4.1 g/dL (ref 3.5–5.2)
Alkaline Phosphatase: 81 U/L (ref 39–117)
BUN: 8 mg/dL (ref 6–23)
CO2: 28 meq/L (ref 19–32)
Calcium: 9.6 mg/dL (ref 8.4–10.5)
Chloride: 102 meq/L (ref 96–112)
Creatinine, Ser: 0.75 mg/dL (ref 0.40–1.20)
GFR: 75.58 mL/min (ref >60.00)
Glucose, Bld: 90 mg/dL (ref 70–99)
Potassium: 4 meq/L (ref 3.5–5.1)
Sodium: 140 meq/L (ref 135–145)
Total Bilirubin: 0.3 mg/dL (ref 0.2–1.2)
Total Protein: 7.6 g/dL (ref 6.0–8.3)

## 2024-04-09 LAB — CBC
HCT: 36.8 % (ref 36.0–46.0)
Hemoglobin: 12 g/dL (ref 12.0–15.0)
MCHC: 32.6 g/dL (ref 30.0–36.0)
MCV: 92.9 fl (ref 78.0–100.0)
Platelets: 407 10*3/uL — ABNORMAL HIGH (ref 150.0–400.0)
RBC: 3.96 Mil/uL (ref 3.87–5.11)
RDW: 13.1 % (ref 11.5–15.5)
WBC: 5.6 10*3/uL (ref 4.0–10.5)

## 2024-04-09 LAB — TSH: TSH: 3.51 u[IU]/mL (ref 0.35–5.50)

## 2024-04-09 LAB — MAGNESIUM: Magnesium: 2.3 mg/dL (ref 1.5–2.5)

## 2024-04-09 MED ORDER — DOXYCYCLINE HYCLATE 100 MG PO TABS: 100.0000 mg | ORAL_TABLET | Freq: Two times a day (BID) | ORAL | 0 refills | Status: DC

## 2024-04-09 NOTE — Telephone Encounter (Signed)
Patient was evaluated in office today.

## 2024-04-09 NOTE — Progress Notes (Signed)
 Acute Office Visit  Subjective:     Patient ID: Casey [name], female    DOB: 1946-06-23, 78 y.o.   MRN: [id number]  Chief Complaint  Patient presents with   Rectal Problems    Pt complains of knot on in vaginal area under the skin. Pt noticed on Saturday. Pt also complains of constipation and states that the knot is causing pain in her back and legs. Constipation has been ongoing since march. Pt states she feels full/bloated all around stomach.     HPI Patient is in today for rectal lump/pain with a history of chronic constipation, RA, osteoporosis, HLD, chronic pain syndrome, abscess.  Patient's last colonoscopy: 2018, no repeat follow up recommended in regards to screening purposes  Patient does have a history of chronic constipation and was followed by GI in [place]. She has had 3 ED visit since 02/2024 to deal with constipation and abdominal pain. They have used lactulose , glycerin  suppositories and wheat dextran.   States that she noticed knot in her perineum. States that she feels full and checked the area. States that she did have an abscess on the other side April States that her last BM was  today that was small after she took the lactulose .  No blood in her stool or urine.  She does have a follow up with GI in 05/20/2024. That will be with [facility]  No discharge from the knot. No fever, chills.   She is drinking 8-10 bottles a day. States that she does take care of herself and housework   Review of Systems  Constitutional:  Negative for chills and fever.  Respiratory:  Negative for shortness of breath.   Cardiovascular:  Negative for chest pain.  Gastrointestinal:  Positive for abdominal pain and constipation. Negative for blood in stool and melena.  Neurological:  Negative for dizziness and headaches.        Objective:    BP (!) 150/90   Pulse 80   Temp 97.9 F (36.6 C) (Oral)   Ht 5\' 4"  (1.626 m)   Wt 125 lb 6.4 oz (56.9 kg)   SpO2 95%   BMI 21.52  kg/m  BP Readings from Last 3 Encounters:  04/09/24 (!) 150/90  04/02/24 (!) 168/92  03/28/24 (!) 167/98   Wt Readings from Last 3 Encounters:  04/09/24 125 lb 6.4 oz (56.9 kg)  04/02/24 139 lb (63 kg)  03/28/24 130 lb (59 kg)   SpO2 Readings from Last 3 Encounters:  04/09/24 95%  04/02/24 99%  03/28/24 98%      Physical Exam Vitals and nursing note reviewed. Exam conducted with a chaperone present Doretha Ganja, CMA).  Constitutional:      Appearance: Normal appearance.  Cardiovascular:     Rate and Rhythm: Normal rate and regular rhythm.     Heart sounds: Normal heart sounds.  Pulmonary:     Effort: Pulmonary effort is normal.     Breath sounds: Normal breath sounds.  Abdominal:     General: Bowel sounds are normal. There is no distension.     Palpations: There is no mass.     Tenderness: There is no abdominal tenderness.     Hernia: No hernia is present.  Genitourinary:    Rectum: Normal.    Musculoskeletal:     Right lower leg: Edema present.     Left lower leg: Edema present.  Neurological:     Mental Status: She is alert.     No results  found for any visits on 04/09/24.      Assessment & Plan:   Problem List Items Addressed This Visit       Digestive   Chronic constipation   History of the same.  Patient currently maintained on lactulose  daily as needed.  Patient states she took "a couple sips" and had a little bit of a bowel movement.  Did encourage patient to take full dose of lactulose  to see if he could produce a bowel movement.  She is followed by GI through [facility] with anticipation switch over to a lower GI next month.  Did review GI note through care everywhere in March of this year.  Patient did have a colonoscopy in 2018 and the recommendation for screening      Relevant Orders   TSH     Other   Leg cramping   Multifactorial check electrolytes inclusive of magnesium  and TSH today.  Patient is getting adequate hydration per her report       Relevant Orders   CBC   Comprehensive metabolic panel with GFR   Magnesium    Abscess - Primary   Area of induration nonfluctuant beginning of an abscess will treat patient with doxycycline  100 mg twice daily.      Relevant Medications   doxycycline  (VIBRA -TABS) 100 MG tablet   Elevated blood pressure reading   Patient had several elevated blood pressure readings as of late.  Patient is uncomfortable in office and send with the emergency department we will defer hypertension management to primary care provider.  Encourage patient to make a follow-up within 2 weeks with her primary care provider       Meds ordered this encounter  Medications   doxycycline  (VIBRA -TABS) 100 MG tablet    Sig: Take 1 tablet (100 mg total) by mouth 2 (two) times daily for 7 days.    Dispense:  14 tablet    Refill:  0    Supervising Provider:   Deri Fleet A [[id number]]    Return in 2 weeks (on 04/23/2024), or with Polly Brink for several ED visit follow ups.  Casey Shay, NP

## 2024-04-09 NOTE — Assessment & Plan Note (Signed)
 Patient had several elevated blood pressure readings as of late.  Patient is uncomfortable in office and send with the emergency department we will defer hypertension management to primary care provider.  Encourage patient to make a follow-up within 2 weeks with her primary care provider

## 2024-04-09 NOTE — Patient Instructions (Addendum)
 Nice to see you today I will be in touch with the labs I will reach out to the GI provider and see if we can increase or change the medication.  Follow up with Casey [name] in the next couple weeks with Casey [name] for hospital follow up

## 2024-04-09 NOTE — Assessment & Plan Note (Signed)
 History of the same.  Patient currently maintained on lactulose  daily as needed.  Patient states she took "a couple sips" and had a little bit of a bowel movement.  Did encourage patient to take full dose of lactulose  to see if he could produce a bowel movement.  She is followed by GI through [facility] with anticipation switch over to a lower GI next month.  Did review GI note through care everywhere in March of this year.  Patient did have a colonoscopy in 2018 and the recommendation for screening

## 2024-04-09 NOTE — Assessment & Plan Note (Signed)
 Multifactorial check electrolytes inclusive of magnesium  and TSH today.  Patient is getting adequate hydration per her report

## 2024-04-09 NOTE — Assessment & Plan Note (Signed)
 Area of induration nonfluctuant beginning of an abscess will treat patient with doxycycline  100 mg twice daily.

## 2024-04-09 NOTE — Telephone Encounter (Signed)
 Chief Complaint: lump right side rectal area Symptoms: pain with walking , sitting and having BMs. Reports stool "can't come out". Could not report size  Frequency: noted Friday or Sunday  Pertinent Negatives: Patient denies fever, no drainage from lump  Disposition: [] ED /[] Urgent Care (no appt availability in office) / [x] Appointment(In office/virtual)/ []  [facility]/ [] Home Care/ [] Refused Recommended Disposition /[]  Mobile Bus/ []  Follow-up with PCP Additional Notes:   No available appt with PCP today . Scheduled appt this am with other provider.       Reason for Disposition  SEVERE rectal pain (e.g., excruciating, unable to have a bowel movement)  Answer Assessment - Initial Assessment Questions 1. APPEARANCE of BOIL: "What does the boil look like?"      unknown 2. LOCATION: "Where is the boil located?"      Rectal area right side  3. NUMBER: "How many boils are there?"      na 4. SIZE: "How big is the boil?" (e.g., inches, cm; compare to size of a coin or other object)     na 5. ONSET: "When did the boil start?"     Friday or Sunday  6. PAIN: "Is there any pain?" If Yes, ask: "How bad is the pain?"   (Scale 1-10; or mild, moderate, severe)     Not so much pain as discomfort and pain not being able to have BM 7. FEVER: "Do you have a fever?" If Yes, ask: "What is it, how was it measured, and when did it start?"      Na  8. SOURCE: "Have you been around anyone with boils or other Staph infections?" "Have you ever had boils before?"     Have boils before  9. OTHER SYMPTOMS: "Do you have any other symptoms?" (e.g., shaking chills, weakness, rash elsewhere on body)     Pain with sitting, walking and having BMs 10. PREGNANCY: "Is there any chance you are pregnant?" "When was your last menstrual period?"       na  Answer Assessment - Initial Assessment Questions 1. SYMPTOM:  "What's the main symptom you're concerned about?" (e.g., pain, itching,  swelling, rash)     Lump , swelling close to right side of rectal area 2. ONSET: "When did the sx  start?"     Noted Friday or Sunday  3. RECTAL PAIN: "Do you have any pain around your rectum?" "How bad is the pain?"  (Scale 0-10; or mild, moderate, severe)   - NONE (0): no pain   - MILD (1-3): doesn't interfere with normal activities    - MODERATE (4-7): interferes with normal activities or awakens from sleep, limping    - SEVERE (8-10): excruciating pain, unable to have a bowel movement      Causes pain sitting and difficulty passing stool 4. RECTAL ITCHING: "Do you have any itching in this area?" "How bad is the itching?"  (Scale 0-10; or mild, moderate, severe)   - NONE: no itching   - MILD: doesn't interfere with normal activities    - MODERATE-SEVERE: interferes with normal activities or awakens from sleep     na 5. CONSTIPATION: "Do you have constipation?" If Yes, ask: "How bad is it?"     yes 6. CAUSE: "What do you think is causing the anus symptoms?"     Lump or boil  7. OTHER SYMPTOMS: "Do you have any other symptoms?"  (e.g., abdomen pain, fever, rectal bleeding, vomiting)     Discomfort walking,  sitting and passing stool.  8. PREGNANCY: "Is there any chance you are pregnant?" "When was your last menstrual period?"     na  Protocols used: Boil (Skin Abscess)-A-AH, Rectal Symptoms-A-AH

## 2024-04-10 ENCOUNTER — Ambulatory Visit: Payer: Self-pay | Admitting: Nurse Practitioner

## 2024-04-16 ENCOUNTER — Encounter: Payer: Self-pay | Admitting: Internal Medicine

## 2024-04-16 ENCOUNTER — Ambulatory Visit: Payer: Self-pay

## 2024-04-16 ENCOUNTER — Ambulatory Visit (INDEPENDENT_AMBULATORY_CARE_PROVIDER_SITE_OTHER): Admitting: Internal Medicine

## 2024-04-16 VITALS — BP 138/88 | HR 100 | Temp 98.7°F | Ht 64.0 in | Wt 123.0 lb

## 2024-04-16 DIAGNOSIS — K5909 Other constipation: Secondary | ICD-10-CM | POA: Diagnosis not present

## 2024-04-16 MED ORDER — LINACLOTIDE 145 MCG PO CAPS: 145.0000 ug | ORAL_CAPSULE | Freq: Every day | ORAL | 1 refills | Status: DC

## 2024-04-16 NOTE — Progress Notes (Signed)
 Subjective:    Patient ID: Casey [name], female    DOB: [birth year], 78 y.o.   MRN: [id number]  HPI Here due to ongoing issues with constipation  Goes back years Uses miralax  --uses full capful twice a day Fiber tablets 3 tid Lactulose  30 ml daily Has tried prune juice Had pain even into her back due to stool burden  Did have some nausea 2 days ago--regurgitated food Happened while moving bowels 2 days ago  2 ER visits in the past 3 weeks CT showed substantial stool but no other worrisome findings  Current Outpatient Medications on File Prior to Visit  Medication Sig Dispense Refill   acetaminophen  (TYLENOL ) 500 MG tablet Take 2 tablets (1,000 mg total) by mouth every 6 (six) hours as needed for mild pain. 30 tablet 0   ciclopirox  (PENLAC ) 8 % solution Apply topically at bedtime. Apply over nail and surrounding skin. Apply daily over previous coat. After seven (7) days, may remove with alcohol and continue cycle. 6.6 mL 0   cyanocobalamin  (VITAMIN B12) 1000 MCG tablet Take 1,000 mcg by mouth daily.     ELDERBERRY PO Take 1 tablet by mouth daily. Vitamin C  and zinc      etodolac (LODINE) 400 MG tablet Take 400 mg by mouth daily as needed for mild pain or moderate pain.     gabapentin  (NEURONTIN ) 300 MG capsule      lactulose  (CHRONULAC ) 10 GM/15ML solution Take 45 mLs (30 g total) by mouth daily as needed for mild constipation. 236 mL 0   Multiple Vitamin (MULTIVITAMIN WITH MINERALS) TABS tablet Take 1 tablet by mouth daily. Centrum Silver     polyethylene glycol (MIRALAX  / GLYCOLAX ) 17 g packet Take 17 g by mouth daily as needed for mild constipation. 14 each 0   pravastatin  (PRAVACHOL ) 40 MG tablet Take 1 tablet (40 mg total) by mouth daily. For cholesterol 90 tablet 3   Upadacitinib ER 15 MG TB24 Take by mouth.     vitamin C  (ASCORBIC ACID ) 250 MG tablet Take 250 mg by mouth daily.     Wheat Dextrin (BENEFIBER DRINK MIX) PACK Take 1 packet by mouth daily. 30 each 0   No  current facility-administered medications on file prior to visit.    Allergies  Allergen Reactions   Leflunomide Other (See Comments)    GI upset     Methotrexate Other (See Comments)    GI upset    Past Medical History:  Diagnosis Date   Allergy    Anterolisthesis 06/17/2020   Chronic back pain    Herpes zoster without complication 10/09/2020   History of kidney stones    Lumbar facet arthropathy 10/31/2019   Nondisplaced fracture of right radial styloid process, initial encounter for closed fracture 08/06/2018   Osteoporosis    Rheumatoid arthritis (HCC)     Past Surgical History:  Procedure Laterality Date   ANTERIOR LATERAL LUMBAR FUSION WITH PERCUTANEOUS SCREW 1 LEVEL N/A 06/17/2020   Procedure: L4-5 EXTREME LATERAL INTERBODY FUSION(XLIF)/POSTERIOR SPINAL FUSION (PSF);  Surgeon: Jodeen Munch, MD;  Location: [facility] ORS;  Service: Neurosurgery;  Laterality: N/A;   BACK SURGERY  12/2022   EPIDURAL BLOCK INJECTION     great toe Left    straightened and pinned   LUMBAR LAMINECTOMY/DECOMPRESSION MICRODISCECTOMY Right 06/17/2020   Procedure: RIGHT L2-3 MICRODISCECTOMY;  Surgeon: Jodeen Munch, MD;  Location: [facility] ORS;  Service: Neurosurgery;  Laterality: Right;   NECK SURGERY     ROTATOR CUFF REPAIR Right  Family History  Problem Relation Age of Onset   Heart attack Mother    Mental illness Mother    Bone cancer Brother    Heart attack Brother     Social History   Socioeconomic History   Marital status: Widowed    Spouse name: Not on file   Number of children: Not on file   Years of education: Not on file   Highest education level: Not on file  Occupational History   Not on file  Tobacco Use   Smoking status: Never   Smokeless tobacco: Never  Vaping Use   Vaping status: Never Used  Substance and Sexual Activity   Alcohol use: Never   Drug use: Never   Sexual activity: Not on file  Other Topics Concern   Not on file  Social History  Narrative   Not on file   Social Drivers of Health   Financial Resource Strain: Low Risk  (06/05/2023)   Overall Financial Resource Strain (CARDIA)    Difficulty of Paying Living Expenses: Not hard at all  Food Insecurity: No Food Insecurity (06/05/2023)   Hunger Vital Sign    Worried About Running Out of Food in the Last Year: Never true    Ran Out of Food in the Last Year: Never true  Transportation Needs: No Transportation Needs (06/05/2023)   PRAPARE - Administrator, Civil Service (Medical): No    Lack of Transportation (Non-Medical): No  Physical Activity: Insufficiently Active (06/05/2023)   Exercise Vital Sign    Days of Exercise per Week: 2 days    Minutes of Exercise per Session: 60 min  Stress: No Stress Concern Present (06/05/2023)   [employer] of Occupational Health - Occupational Stress Questionnaire    Feeling of Stress : Not at all  Social Connections: Moderately Integrated (06/05/2023)   Social Connection and Isolation Panel [NHANES]    Frequency of Communication with Friends and Family: More than three times a week    Frequency of Social Gatherings with Friends and Family: More than three times a week    Attends Religious Services: More than 4 times per year    Active Member of [employer] or Organizations: Yes    Attends Banker Meetings: More than 4 times per year    Marital Status: Widowed  Recent Concern: Social Connections - Moderately Isolated (06/05/2023)   Social Connection and Isolation Panel [NHANES]    Frequency of Communication with Friends and Family: More than three times a week    Frequency of Social Gatherings with Friends and Family: More than three times a week    Attends Religious Services: More than 4 times per year    Active Member of [employer] or Organizations: No    Attends Banker Meetings: Never    Marital Status: Widowed  Intimate Partner Violence: Not At Risk (06/05/2023)   Humiliation, Afraid, Rape, and Kick  questionnaire    Fear of Current or Ex-Partner: No    Emotionally Abused: No    Physically Abused: No    Sexually Abused: No   Review of Systems Appetite is okay Has lost weight recently     Objective:   Physical Exam         Assessment & Plan:

## 2024-04-16 NOTE — Telephone Encounter (Signed)
 Copied from CRM [id number]. Topic: Clinical - Red Word Triage >> Apr 16, 2024  9:59 AM Oddis Bench wrote: Red Word that prompted transfer to Nurse Triage: Patient is calling stating that she is having pain in her stomach , legs, buttocks and her feet are swollen. She thinks it is from being constipated she has been constipated since Feb.  Chief Complaint: constipation; declined triage questions Symptoms: constipation Frequency: constant Pertinent Negatives: Patient denies NA Disposition: [] ED /[] Urgent Care (no appt availability in office) / [x] Appointment(In office/virtual)/ []  [facility]/ [] Home Care/ [] Refused Recommended Disposition /[] [facility]/ []  Follow-up with PCP Additional Notes: apt today; care advice given, denies questions; instructed to go to ER if becomes worse.   Reason for Disposition  Unable to have a bowel movement (BM) without manually removing stool (using finger to pull out stool or perform disimpaction)  Answer Assessment - Initial Assessment Questions 1. STOOL PATTERN OR FREQUENCY: "How often do you have a bowel movement (BM)?"  (Normal range: 3 times a day to every 3 days)  "When was your last BM?"       Declined triage 2. STRAINING: "Do you have to strain to have a BM?"       3. RECTAL PAIN: "Does your rectum hurt when the stool comes out?" If Yes, ask: "Do you have hemorrhoids? How bad is the pain?"  (Scale 1-10; or mild, moderate, severe)      4. STOOL COMPOSITION: "Are the stools hard?"       5. BLOOD ON STOOLS: "Has there been any blood on the toilet tissue or on the surface of the BM?" If Yes, ask: "When was the last time?"      6. CHRONIC CONSTIPATION: "Is this a new problem for you?"  If No, ask: "How long have you had this problem?" (days, weeks, months)       7. CHANGES IN DIET OR HYDRATION: "Have there been any recent changes in your diet?" "How much fluids are you drinking on a daily basis?"  "How much have you had to drink  today?"      8. MEDICINES: "Have you been taking any new medicines?" "Are you taking any narcotic pain medicines?" (e.g., Dilaudid , morphine , Percocet, Vicodin)      9. LAXATIVES: "Have you been using any stool softeners, laxatives, or enemas?"  If Yes, ask "What, how often, and when was the last time?"      10. ACTIVITY:  "How much walking do you do every day?"  "Has your activity level decreased in the past week?"         11. CAUSE: "What do you think is causing the constipation?"         12. OTHER SYMPTOMS: "Do you have any other symptoms?" (e.g., abdomen pain, bloating, fever, vomiting)        13. MEDICAL HISTORY: "Do you have a history of hemorrhoids, rectal fissures, or rectal surgery or rectal abscess?"          14. PREGNANCY: "Is there any chance you are pregnant?" "When was your last menstrual period?"  Protocols used: Constipation-A-AH

## 2024-04-16 NOTE — Assessment & Plan Note (Signed)
 Severe and obstipated Wants "it all pulled out of me"--but I told her I don't know what she is referring to Could try taking the miralax  more often--like 3-4 times a day Offered linzess but is pricey--she seems reluctant

## 2024-04-16 NOTE — Telephone Encounter (Signed)
 See OV  note

## 2024-04-19 ENCOUNTER — Emergency Department

## 2024-04-19 ENCOUNTER — Other Ambulatory Visit: Payer: Self-pay

## 2024-04-19 ENCOUNTER — Emergency Department
Admission: EM | Admit: 2024-04-19 | Discharge: 2024-04-19 | Disposition: A | Discharge status: Home / Self Care | Attending: Emergency Medicine | Admitting: Emergency Medicine

## 2024-04-19 DIAGNOSIS — R109 Unspecified abdominal pain: Secondary | ICD-10-CM | POA: Diagnosis not present

## 2024-04-19 DIAGNOSIS — K5909 Other constipation: Secondary | ICD-10-CM | POA: Diagnosis not present

## 2024-04-19 DIAGNOSIS — N200 Calculus of kidney: Secondary | ICD-10-CM | POA: Diagnosis not present

## 2024-04-19 DIAGNOSIS — K5900 Constipation, unspecified: Secondary | ICD-10-CM | POA: Insufficient documentation

## 2024-04-19 LAB — CBC WITH DIFFERENTIAL/PLATELET
Abs Immature Granulocytes: 0.02 10*3/uL (ref 0.00–0.07)
Basophils Absolute: 0 10*3/uL (ref 0.0–0.1)
Basophils Relative: 0 %
Eosinophils Absolute: 0 10*3/uL (ref 0.0–0.5)
Eosinophils Relative: 0 %
HCT: 38.3 % (ref 36.0–46.0)
Hemoglobin: 12.5 g/dL (ref 12.0–15.0)
Immature Granulocytes: 0 %
Lymphocytes Relative: 24 %
Lymphs Abs: 2 10*3/uL (ref 0.7–4.0)
MCH: 30 pg (ref 26.0–34.0)
MCHC: 32.6 g/dL (ref 30.0–36.0)
MCV: 91.8 fL (ref 80.0–100.0)
Monocytes Absolute: 0.6 10*3/uL (ref 0.1–1.0)
Monocytes Relative: 7 %
Neutro Abs: 5.7 10*3/uL (ref 1.7–7.7)
Neutrophils Relative %: 69 %
Platelets: 303 10*3/uL (ref 150–400)
RBC: 4.17 MIL/uL (ref 3.87–5.11)
RDW: 13 % (ref 11.5–15.5)
WBC: 8.4 10*3/uL (ref 4.0–10.5)
nRBC: 0 % (ref 0.0–0.2)

## 2024-04-19 LAB — COMPREHENSIVE METABOLIC PANEL WITH GFR
ALT: 16 U/L (ref 0–44)
AST: 26 U/L (ref 15–41)
Albumin: 3.6 g/dL (ref 3.5–5.0)
Alkaline Phosphatase: 78 U/L (ref 38–126)
Anion gap: 12 (ref 5–15)
BUN: 8 mg/dL (ref 8–23)
CO2: 22 mmol/L (ref 22–32)
Calcium: 9.6 mg/dL (ref 8.9–10.3)
Chloride: 102 mmol/L (ref 98–111)
Creatinine, Ser: 0.71 mg/dL (ref 0.44–1.00)
GFR, Estimated: >60 mL/min (ref >60)
Glucose, Bld: 107 mg/dL — ABNORMAL HIGH (ref 70–99)
Potassium: 3.6 mmol/L (ref 3.5–5.1)
Sodium: 136 mmol/L (ref 135–145)
Total Bilirubin: 0.7 mg/dL (ref 0.0–1.2)
Total Protein: 8.1 g/dL (ref 6.5–8.1)

## 2024-04-19 LAB — LIPASE, BLOOD: Lipase: 27 U/L (ref 11–51)

## 2024-04-19 MED ORDER — IOHEXOL 9 MG/ML PO SOLN
500.0000 mL | Freq: Once | ORAL | Status: AC | PRN
Start: 2024-04-19 — End: 2024-04-19
  Administered 2024-04-19: 500 mL via ORAL

## 2024-04-19 MED ORDER — POLYETHYLENE GLYCOL 3350 17 GM/SCOOP PO POWD: 17.0000 g | Freq: Two times a day (BID) | ORAL | 0 refills | Status: AC | PRN

## 2024-04-19 MED ORDER — IOHEXOL 300 MG/ML  SOLN
100.0000 mL | Freq: Once | INTRAMUSCULAR | Status: AC | PRN
  Administered 2024-04-19: 75 mL via INTRAVENOUS

## 2024-04-19 MED ORDER — SODIUM CHLORIDE 0.9 % IV BOLUS: 500.0000 mL | Freq: Once | INTRAVENOUS | Status: DC

## 2024-04-19 MED ORDER — FENTANYL CITRATE PF 50 MCG/ML IJ SOSY
25.0000 ug | PREFILLED_SYRINGE | Freq: Once | INTRAMUSCULAR | Status: AC
  Administered 2024-04-19: 25 ug via INTRAVENOUS
  Filled 2024-04-19: qty 1

## 2024-04-19 MED ORDER — DOCUSATE SODIUM 100 MG PO CAPS: 100.0000 mg | ORAL_CAPSULE | Freq: Two times a day (BID) | ORAL | 2 refills | Status: AC

## 2024-04-19 NOTE — Discharge Instructions (Addendum)
 Please begin taking Colace twice daily as well as MiraLAX  twice daily if needed for constipation.  Once you have had multiple bowel movements you may discontinue the MiraLAX  but continue taking the Colace twice daily.  Please follow-up with your doctor on Monday for recheck/reevaluation.  Return to the emergency department for any return of/worsening abdominal pain, development of fever, or any other symptom personally concerning to yourself.

## 2024-04-19 NOTE — ED Provider Notes (Signed)
 [facility] Provider Note   Event Date/Time   First MD Initiated Contact with Patient 04/19/24 1026     (approximate) History  Constipation  HPI Casey [name] is a 78 y.o. female with a past medical history of chronic back pain and chronic constipation who presents complaining of absence of passing stool or flatus in the last 24 hours with a sensation of distention in her abdomen.  Patient also states that this pain radiates down to bilateral legs however she does endorse history of sciatica bilaterally.  Patient states last BM was yesterday morning and was very small.  Patient states that she was just placed on Linzess 2 days prior to the symptoms occurring. ROS: Patient currently denies any vision changes, tinnitus, difficulty speaking, facial droop, sore throat, chest pain, shortness of breath, abdominal pain, nausea/vomiting/diarrhea, dysuria, or weakness/numbness/paresthesias in any extremity   Physical Exam  Triage Vital Signs: ED Triage Vitals  Encounter Vitals Group     BP 04/19/24 0956 (!) 153/84     Systolic BP Percentile --      Diastolic BP Percentile --      Pulse Rate 04/19/24 0956 (!) 115     Resp 04/19/24 0956 18     Temp 04/19/24 0956 98.6 F (37 C)     Temp src --      SpO2 04/19/24 0956 100 %     Weight --      Height --      Head Circumference --      Peak Flow --      Pain Score 04/19/24 0957 10     Pain Loc --      Pain Education --      Exclude from Growth Chart --    Most recent vital signs: Vitals:   04/19/24 1900 04/19/24 1930  BP: (!) 143/69 (!) 144/79  Pulse: (!) 103 (!) 101  Resp:    Temp:    SpO2: 100% 100%   General: Awake, oriented x4. CV:  Good peripheral perfusion.  Resp:  Normal effort.  Abd:  No distention.  Other:  Elderly African-American female resting comfortably in no acute distress ED Results / Procedures / Treatments  Labs (all labs ordered are listed, but only abnormal results are  displayed) Labs Reviewed  COMPREHENSIVE METABOLIC PANEL WITH GFR - Abnormal; Notable for the following components:      Result Value   Glucose, Bld 107 (*)    All other components within normal limits  CBC WITH DIFFERENTIAL/PLATELET  LIPASE, BLOOD  RADIOLOGY ED MD interpretation: Single view portable abdominal x-ray independently interpreted and shows mildly dilated gas-filled colon in the left mid and upper abdomen and inferior left pelvis -Agree with radiology assessment Official radiology report(s): CT ABDOMEN PELVIS W CONTRAST Result Date: 04/19/2024 CLINICAL DATA:  Chronic constipation but worsening abdominal pain, possible bowel obstruction EXAM: CT ABDOMEN AND PELVIS WITH CONTRAST TECHNIQUE: Multidetector CT imaging of the abdomen and pelvis was performed using the standard protocol following bolus administration of intravenous contrast. RADIATION DOSE REDUCTION: This exam was performed according to the departmental dose-optimization program which includes automated exposure control, adjustment of the mA and/or kV according to patient size and/or use of iterative reconstruction technique. CONTRAST:  75mL OMNIPAQUE  IOHEXOL  300 MG/ML  SOLN COMPARISON:  Multiple exams, including 04/02/2024 FINDINGS: Lower chest: Mild scarring anteriorly in the left lower lobe and in the posterior basal segment of the right lower lobe. 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 As noted on the 04/02/2024 exam, a follow up pancreatic protocol MRI with and without contrast is recommended in 6 months time. Spleen: Unremarkable Adrenals/Urinary Tract: 2 mm left kidney lower pole nonobstructive renal calculus. Stomach/Bowel: The previous substantial prominence of stool in the colon has resolved. Orally administered contrast extends through to the colon. There is no dilated small bowel to  indicate obstruction. Borderline prominence of gas in the rectal vault. Low lying cluster of small bowel loops in the right hemipelvis. No pneumatosis or portal venous gas. Appendix unremarkable. Vascular/Lymphatic: Atherosclerosis is present, including aortoiliac atherosclerotic disease. Atheromatous plaque dorsally at the origin of the celiac artery may cause stenosis but no overt occlusion. Lesser degree of atheromatous plaque proximally in the superior mesenteric artery. Reproductive: Unremarkable Other: Trace free pelvic fluid adjacent to the pelvic floor, image 74 series 2. Musculoskeletal: Spurring of both hips and femoral heads. Mild levoconvex lumbar scoliosis. Postoperative findings at L4-5. IMPRESSION: 1. The previous substantial prominence of stool in the colon has resolved. No dilated small bowel to indicate obstruction. 2. 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 As noted on the 04/02/2024 exam, a follow up pancreatic protocol MRI with and without contrast is recommended in 6 months time. 3. 2 mm left kidney lower pole nonobstructive renal calculus. 4. Trace free pelvic fluid adjacent to the pelvic floor. 5.  Aortic Atherosclerosis (ICD10-I70.0). Electronically Signed   By: Freida Jes M.D.   On: 04/19/2024 18:39   PROCEDURES: Critical Care performed: No Procedures MEDICATIONS ORDERED IN ED: Medications  iohexol  (OMNIPAQUE ) 9 MG/ML oral solution 500 mL (500 mLs Oral Contrast Given 04/19/24 1303)  iohexol  (OMNIPAQUE ) 300 MG/ML solution 100 mL (75 mLs Intravenous Contrast Given 04/19/24 1525)  fentaNYL  (SUBLIMAZE ) injection 25 mcg (25 mcg Intravenous Given 04/19/24 1647)   IMPRESSION / MDM / ASSESSMENT AND PLAN / ED COURSE  I reviewed the triage vital signs and the nursing notes.                             The patient is on the cardiac monitor to evaluate for evidence of arrhythmia and/or  significant heart rate changes. Patient's presentation is most consistent with acute presentation with potential threat to life or bodily function. Given History, Exam I believe patient needs labs and imaging to evaluate for SBO vs other acute abdomen. ED Workup: CBC, BMP, LFTs, CT Abdomen/Pelvis ED Findings: Pending CT: Pending  ED Interventions: Analgesia. Defer ABX at this time. Care of this patient will be signed out to the oncoming physician at the end of my shift.  All pertinent patient information conveyed and all questions answered.  All further care and disposition decisions will be made by the oncoming physician.   FINAL CLINICAL IMPRESSION(S) / ED DIAGNOSES   Final diagnoses:  Constipation, unspecified constipation type  Abdominal pain, unspecified abdominal location   Rx / DC Orders   ED Discharge Orders          Ordered    polyethylene glycol powder (GLYCOLAX /MIRALAX ) 17 GM/SCOOP powder  2 times daily PRN        04/19/24 1917    docusate sodium  (COLACE) 100 MG capsule  2 times daily        04/19/24 1917  Note:  This document was prepared using Dragon voice recognition software and may include unintentional dictation errors.   Benny Henrie K, MD 04/20/24 [phone]

## 2024-04-19 NOTE — ED Triage Notes (Signed)
 Pt comes in via pov with complaints of being constipated. Pt states that she has had issues with chronic constipation, but the pain got worse yesterday. The pain is now affecting her walking. Pt states that the discomfort radiates to her legs as well. Pt's last BM was yesterday, but was very small. Pt is alert and oriented x4 with mo signs of acute distress at this time.

## 2024-04-19 NOTE — ED Notes (Signed)
 Pt finished bottle of contrast at this time

## 2024-04-19 NOTE — ED Provider Notes (Signed)
-----------------------------------------   7:11 PM on 04/19/2024 ----------------------------------------- Patient care assumed from Dr. Alejo Amsler.  Patient CT scan has resulted showing the substantial stool and gas that was present on the x-ray has since resolved.  No dilated small bowel loop no concern for obstruction or ileus.  Patient does have a 1.5 x 1 cm mass on the pancreatic head.  They recommend MRI follow-up in 6 months this has been conveyed to the patient.  Will discharge the patient home with outpatient follow-up.  Remainder of the workup has not shown any significant finding.   Ruth Cove, MD 04/19/24 [phone]

## 2024-04-25 DIAGNOSIS — K6289 Other specified diseases of anus and rectum: Secondary | ICD-10-CM | POA: Diagnosis not present

## 2024-04-25 DIAGNOSIS — K5900 Constipation, unspecified: Secondary | ICD-10-CM | POA: Diagnosis not present

## 2024-04-30 ENCOUNTER — Ambulatory Visit (INDEPENDENT_AMBULATORY_CARE_PROVIDER_SITE_OTHER): Admitting: Primary Care

## 2024-04-30 ENCOUNTER — Encounter: Payer: Self-pay | Admitting: Primary Care

## 2024-04-30 VITALS — BP 152/80 | HR 88 | Temp 97.5°F | Ht 64.0 in | Wt 122.0 lb

## 2024-04-30 DIAGNOSIS — K8689 Other specified diseases of pancreas: Secondary | ICD-10-CM | POA: Insufficient documentation

## 2024-04-30 DIAGNOSIS — K5909 Other constipation: Secondary | ICD-10-CM

## 2024-04-30 NOTE — Assessment & Plan Note (Signed)
 Improved. Reviewed ED notes, labs, imaging from May 2025.  Continue Miralax  BID. Proceed with colonoscopy as scheduled.   Remain off of lactulose  and Linzess .

## 2024-04-30 NOTE — Patient Instructions (Addendum)
 Call [facility] Radiology department for a copy of your MRI from May 2023. [phone].  Continue taking MiraLAX  twice daily for constipation.  It was a pleasure to see you today!

## 2024-04-30 NOTE — Assessment & Plan Note (Signed)
 Incidental finding.  Check MRI with and without contrast in November 2025 per radiology recommendations.

## 2024-04-30 NOTE — Progress Notes (Addendum)
 Subjective:    Patient ID: Casey [name], female    DOB: Nov 30, 1945, 78 y.o.   MRN: [id number]  Constipation Associated symptoms include back pain. Pertinent negatives include no abdominal pain, nausea or vomiting.    Casey [name] is a very pleasant 78 y.o. female with a history of chronic constipation, rheumatoid arthritis, chronic back pain, chronic knee pain, chronic pain syndrome, lower extremity edema, chronic hip pain who presents today for follow-up of constipation.  Evaluated several times in our office in May 2025 for constipation.  Recommendations range from continuation of lactulose  versus taking MiraLAX  and trial of Linzess .    She presented to [facility] ED on 04/19/2024 for ongoing constipation.  CT abdomen/pelvis showed resolution of constipation and gas, but there was a hypodense pancreatic head lesion which was either neoplasm or postinflammatory cystic lesion.  Follow-up MRI with and without contrast is recommended in 6 months.   Evaluated by GI on 04/25/2024 for constipation.  Was advised to start a 2-day MiraLAX  purge and undergo colonoscopy in the near future.  Her rectal pain was suspected to be secondary to chronic spinal issues and was advised to follow-up with orthopedics/neurosurgery.  Today she discusses bowel movements are once daily since she's been taking Miralax  BID. She is pending colonoscopy for later this month.    Review of Systems  Gastrointestinal:  Negative for abdominal pain, constipation, nausea and vomiting.  Musculoskeletal:  Positive for arthralgias and back pain.  Neurological:  Positive for numbness.         Past Medical History:  Diagnosis Date   Allergy    Anterolisthesis 06/17/2020   Chronic back pain    Herpes zoster without complication 10/09/2020   History of kidney stones    Lumbar facet arthropathy 10/31/2019   Nondisplaced fracture of right radial styloid process, initial encounter for closed fracture 08/06/2018   Osteoporosis     Rheumatoid arthritis (HCC)     Social History   Socioeconomic History   Marital status: Widowed    Spouse name: Not on file   Number of children: Not on file   Years of education: Not on file   Highest education level: Not on file  Occupational History   Not on file  Tobacco Use   Smoking status: Never   Smokeless tobacco: Never  Vaping Use   Vaping status: Never Used  Substance and Sexual Activity   Alcohol use: Never   Drug use: Never   Sexual activity: Not on file  Other Topics Concern   Not on file  Social History Narrative   Not on file   Social Drivers of Health   Financial Resource Strain: Low Risk  (06/05/2023)   Overall Financial Resource Strain (CARDIA)    Difficulty of Paying Living Expenses: Not hard at all  Food Insecurity: No Food Insecurity (06/05/2023)   Hunger Vital Sign    Worried About Running Out of Food in the Last Year: Never true    Ran Out of Food in the Last Year: Never true  Transportation Needs: No Transportation Needs (06/05/2023)   PRAPARE - Administrator, Civil Service (Medical): No    Lack of Transportation (Non-Medical): No  Physical Activity: Insufficiently Active (06/05/2023)   Exercise Vital Sign    Days of Exercise per Week: 2 days    Minutes of Exercise per Session: 60 min  Stress: No Stress Concern Present (06/05/2023)   [employer] of Occupational Health - Occupational Stress Questionnaire  Feeling of Stress : Not at all  Social Connections: Moderately Integrated (06/05/2023)   Social Connection and Isolation Panel [NHANES]    Frequency of Communication with Friends and Family: More than three times a week    Frequency of Social Gatherings with Friends and Family: More than three times a week    Attends Religious Services: More than 4 times per year    Active Member of [employer] or Organizations: Yes    Attends Banker Meetings: More than 4 times per year    Marital Status: Widowed  Recent Concern:  Social Connections - Moderately Isolated (06/05/2023)   Social Connection and Isolation Panel [NHANES]    Frequency of Communication with Friends and Family: More than three times a week    Frequency of Social Gatherings with Friends and Family: More than three times a week    Attends Religious Services: More than 4 times per year    Active Member of [employer] or Organizations: No    Attends Banker Meetings: Never    Marital Status: Widowed  Intimate Partner Violence: Not At Risk (06/05/2023)   Humiliation, Afraid, Rape, and Kick questionnaire    Fear of Current or Ex-Partner: No    Emotionally Abused: No    Physically Abused: No    Sexually Abused: No    Past Surgical History:  Procedure Laterality Date   ANTERIOR LATERAL LUMBAR FUSION WITH PERCUTANEOUS SCREW 1 LEVEL N/A 06/17/2020   Procedure: L4-5 EXTREME LATERAL INTERBODY FUSION(XLIF)/POSTERIOR SPINAL FUSION (PSF);  Surgeon: Jodeen Munch, MD;  Location: [facility] ORS;  Service: Neurosurgery;  Laterality: N/A;   BACK SURGERY  12/2022   EPIDURAL BLOCK INJECTION     great toe Left    straightened and pinned   LUMBAR LAMINECTOMY/DECOMPRESSION MICRODISCECTOMY Right 06/17/2020   Procedure: RIGHT L2-3 MICRODISCECTOMY;  Surgeon: Jodeen Munch, MD;  Location: [facility] ORS;  Service: Neurosurgery;  Laterality: Right;   NECK SURGERY     ROTATOR CUFF REPAIR Right     Family History  Problem Relation Age of Onset   Heart attack Mother    Mental illness Mother    Bone cancer Brother    Heart attack Brother     Allergies  Allergen Reactions   Leflunomide Other (See Comments)    GI upset     Methotrexate Other (See Comments)    GI upset    Current Outpatient Medications on File Prior to Visit  Medication Sig Dispense Refill   acetaminophen  (TYLENOL ) 500 MG tablet Take 2 tablets (1,000 mg total) by mouth every 6 (six) hours as needed for mild pain. 30 tablet 0   docusate sodium  (COLACE) 100 MG capsule Take 1 capsule (100  mg total) by mouth 2 (two) times daily. 60 capsule 2   ELDERBERRY PO Take 1 tablet by mouth daily. Vitamin C  and zinc      gabapentin  (NEURONTIN ) 300 MG capsule      polyethylene glycol powder (GLYCOLAX /MIRALAX ) 17 GM/SCOOP powder Take 17 g by mouth 2 (two) times daily as needed for moderate constipation. 255 g 0   pravastatin  (PRAVACHOL ) 40 MG tablet Take 1 tablet (40 mg total) by mouth daily. For cholesterol 90 tablet 3   vitamin C  (ASCORBIC ACID ) 250 MG tablet Take 250 mg by mouth daily.     Wheat Dextrin (BENEFIBER DRINK MIX) PACK Take 1 packet by mouth daily. 30 each 0   ciclopirox  (PENLAC ) 8 % solution Apply topically at bedtime. Apply over nail and surrounding skin. Apply daily  over previous coat. After seven (7) days, may remove with alcohol and continue cycle. (Patient not taking: Reported on 04/30/2024) 6.6 mL 0   cyanocobalamin  (VITAMIN B12) 1000 MCG tablet Take 1,000 mcg by mouth daily. (Patient not taking: Reported on 04/30/2024)     etodolac (LODINE) 400 MG tablet Take 400 mg by mouth daily as needed for mild pain or moderate pain. (Patient not taking: Reported on 04/30/2024)     Multiple Vitamin (MULTIVITAMIN WITH MINERALS) TABS tablet Take 1 tablet by mouth daily. Centrum Silver (Patient not taking: Reported on 04/30/2024)     Upadacitinib ER 15 MG TB24 Take by mouth. (Patient not taking: Reported on 04/30/2024)     No current facility-administered medications on file prior to visit.    BP (!) 152/80   Pulse 88   Temp (!) 97.5 F (36.4 C) (Temporal)   Ht 5\' 4"  (1.626 m)   Wt 122 lb (55.3 kg)   SpO2 98%   BMI 20.94 kg/m  Objective:   Physical Exam Cardiovascular:     Rate and Rhythm: Normal rate and regular rhythm.  Pulmonary:     Effort: Pulmonary effort is normal.  Abdominal:     General: Bowel sounds are normal.     Palpations: Abdomen is soft.     Tenderness: There is no abdominal tenderness.  Musculoskeletal:     Cervical back: Neck supple.  Skin:    General: Skin is  warm and dry.  Neurological:     Mental Status: She is alert and oriented to person, place, and time.  Psychiatric:        Mood and Affect: Mood normal.           Assessment & Plan:  Chronic constipation Assessment & Plan: Improved. Reviewed ED notes, labs, imaging from May 2025.  Continue Miralax  BID. Proceed with colonoscopy as scheduled.   Remain off of lactulose  and Linzess .   Pancreatic mass Assessment & Plan: Incidental finding.  Check MRI with and without contrast in November 2025 per radiology recommendations.          Keenan Dimitrov K Aveena Bari, NP

## 2024-05-07 ENCOUNTER — Ambulatory Visit (INDEPENDENT_AMBULATORY_CARE_PROVIDER_SITE_OTHER): Admitting: Podiatry

## 2024-05-07 DIAGNOSIS — R601 Generalized edema: Secondary | ICD-10-CM

## 2024-05-07 MED ORDER — IBUPROFEN 800 MG PO TABS: 800.0000 mg | ORAL_TABLET | Freq: Four times a day (QID) | ORAL | 1 refills | Status: DC | PRN

## 2024-05-07 NOTE — Progress Notes (Signed)
 Subjective:  Patient ID: Casey [name], female    DOB: December 16, 1945,  MRN: [id number]  Chief Complaint  Patient presents with   Foot Pain    Pt stated that her feet have been swelling and at times it feels like she is walking on sponges     78 y.o. female presents with the above complaint.  Patient presents with bilateral lower extremity swelling.  Patient states that she feels like she is walking on sponges wanted get it evaluated has not seen anyone as prior to seeing me denies any other acute complaints.  She would like to discuss further treatment plans.  Pain scale 7 out of 10 dull aching nature.   Review of Systems: Negative except as noted in the HPI. Denies N/V/F/Ch.  Past Medical History:  Diagnosis Date   Allergy    Anterolisthesis 06/17/2020   Chronic back pain    Herpes zoster without complication 10/09/2020   History of kidney stones    Lumbar facet arthropathy 10/31/2019   Nondisplaced fracture of right radial styloid process, initial encounter for closed fracture 08/06/2018   Osteoporosis    Rheumatoid arthritis (HCC)     Current Outpatient Medications:    ibuprofen  (ADVIL ) 800 MG tablet, Take 1 tablet (800 mg total) by mouth every 6 (six) hours as needed., Disp: 60 tablet, Rfl: 1   acetaminophen  (TYLENOL ) 500 MG tablet, Take 2 tablets (1,000 mg total) by mouth every 6 (six) hours as needed for mild pain., Disp: 30 tablet, Rfl: 0   ciclopirox  (PENLAC ) 8 % solution, Apply topically at bedtime. Apply over nail and surrounding skin. Apply daily over previous coat. After seven (7) days, may remove with alcohol and continue cycle. (Patient not taking: Reported on 04/30/2024), Disp: 6.6 mL, Rfl: 0   cyanocobalamin  (VITAMIN B12) 1000 MCG tablet, Take 1,000 mcg by mouth daily. (Patient not taking: Reported on 04/30/2024), Disp: , Rfl:    docusate sodium  (COLACE) 100 MG capsule, Take 1 capsule (100 mg total) by mouth 2 (two) times daily., Disp: 60 capsule, Rfl: 2   ELDERBERRY PO,  Take 1 tablet by mouth daily. Vitamin C  and zinc , Disp: , Rfl:    etodolac (LODINE) 400 MG tablet, Take 400 mg by mouth daily as needed for mild pain or moderate pain. (Patient not taking: Reported on 04/30/2024), Disp: , Rfl:    gabapentin  (NEURONTIN ) 300 MG capsule, , Disp: , Rfl:    Multiple Vitamin (MULTIVITAMIN WITH MINERALS) TABS tablet, Take 1 tablet by mouth daily. Centrum Silver (Patient not taking: Reported on 04/30/2024), Disp: , Rfl:    polyethylene glycol powder (GLYCOLAX /MIRALAX ) 17 GM/SCOOP powder, Take 17 g by mouth 2 (two) times daily as needed for moderate constipation., Disp: 255 g, Rfl: 0   pravastatin  (PRAVACHOL ) 40 MG tablet, Take 1 tablet (40 mg total) by mouth daily. For cholesterol, Disp: 90 tablet, Rfl: 3   Upadacitinib ER 15 MG TB24, Take by mouth. (Patient not taking: Reported on 04/30/2024), Disp: , Rfl:    vitamin C  (ASCORBIC ACID ) 250 MG tablet, Take 250 mg by mouth daily., Disp: , Rfl:    Wheat Dextrin (BENEFIBER DRINK MIX) PACK, Take 1 packet by mouth daily., Disp: 30 each, Rfl: 0  Social History   Tobacco Use  Smoking Status Never  Smokeless Tobacco Never    Allergies  Allergen Reactions   Leflunomide Other (See Comments)    GI upset     Methotrexate Other (See Comments)    GI upset   Objective:  There were no vitals filed for this visit. There is no height or weight on file to calculate BMI. Constitutional Well developed. Well nourished.  Vascular Dorsalis pedis pulses palpable bilaterally. Posterior tibial pulses palpable bilaterally. Capillary refill normal to all digits.  No cyanosis or clubbing noted. Pedal hair growth normal.  Neurologic Normal speech. Oriented to person, place, and time. Epicritic sensation to light touch grossly present bilaterally.  Dermatologic Bilateral hallux thickened elongated dystrophic toenails x 10 Bilateral swelling noted minimal.  Nonpitting edema.  No open wounds or lesion  Orthopedic: Normal joint ROM without  pain or crepitus bilaterally. No visible deformities. No bony tenderness.   Radiographs: None Assessment:   1. Generalized edema    Plan:  Patient was evaluated and treated and all questions answered.  Bilateral leg edema mild - All questions and concerns were discussed with the patient in extensive detail I discussed with the patient the importance of elevation and compression socks she states understanding.  She is also experiencing some pain with swelling of the knee should benefit from ibuprofen  800.  I encouraged her to take it as needed.  She states understanding.  No follow-ups on file.

## 2024-05-14 NOTE — Progress Notes (Deleted)
 05/14/2024 Casey [name] [id number] February 11, 1946   CHIEF COMPLAINT: Constipation  HISTORY OF PRESENT ILLNESS: Casey [name] is a 78 year old female with a past medical history of rheumatoid arthritis ( MTX, Arava, Enbrel, Humira stopped 2024, Xeljanz, Rinvoq 08/2023 -02/09/2024), osteoporosis, left lumpectomy for a benign nodule. She presents to our office today as referred by Casey Keels PA-C for further evaluation regarding a change in bowel pattern and to schedule a colonoscopy.      Latest Ref Rng & Units 04/19/2024   10:00 AM 04/09/2024    9:58 AM 04/02/2024    3:38 PM  CBC  WBC 4.0 - 10.5 K/uL 8.4  5.6  5.9   Hemoglobin 12.0 - 15.0 g/dL 40.9  81.1  91.4   Hematocrit 36.0 - 46.0 % 38.3  36.8  35.1   Platelets 150 - 400 K/uL 303  407.0  306        Latest Ref Rng & Units 04/19/2024   10:00 AM 04/09/2024    9:58 AM 04/02/2024    3:38 PM  CMP  Glucose 70 - 99 mg/dL 782  90  96   BUN 8 - 23 mg/dL 8  8  9    Creatinine 0.44 - 1.00 mg/dL 9.56  2.13  0.86   Sodium 135 - 145 mmol/L 136  140  139   Potassium 3.5 - 5.1 mmol/L 3.6  4.0  3.3   Chloride 98 - 111 mmol/L 102  102  107   CO2 22 - 32 mmol/L 22  28  22    Calcium 8.9 - 10.3 mg/dL 9.6  9.6  9.4   Total Protein 6.5 - 8.1 g/dL 8.1  7.6  7.8   Total Bilirubin 0.0 - 1.2 mg/dL 0.7  0.3  0.6   Alkaline Phos 38 - 126 U/L 78  81  65   AST 15 - 41 U/L 26  32  25   ALT 0 - 44 U/L 16  13  13      CTAP with contrast 04/19/2024:  CLINICAL DATA:  Chronic constipation but worsening abdominal pain, possible bowel obstruction   EXAM: CT ABDOMEN AND PELVIS WITH CONTRAST   TECHNIQUE: Multidetector CT imaging of the abdomen and pelvis was performed using the standard protocol following bolus administration of intravenous contrast.   RADIATION DOSE REDUCTION: This exam was performed according to the departmental dose-optimization program which includes automated exposure control, adjustment of the mA and/or kV according  to patient size and/or use of iterative reconstruction technique.   CONTRAST:  75mL OMNIPAQUE  IOHEXOL  300 MG/ML  SOLN   COMPARISON:  Multiple exams, including 04/02/2024   FINDINGS: Lower chest: Mild scarring anteriorly in the left lower lobe and in the posterior basal segment of the right lower lobe.   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 As noted on the 04/02/2024 exam, a follow up pancreatic protocol MRI with and without contrast is recommended in 6 months time.   Spleen: Unremarkable   Adrenals/Urinary Tract: 2 mm left kidney lower pole nonobstructive renal calculus.   Stomach/Bowel: The previous substantial prominence of stool in the colon has resolved. Orally administered contrast extends through to the colon. There is no dilated small bowel to indicate obstruction. Borderline prominence of gas in the rectal vault. Low lying cluster of small bowel loops in the right hemipelvis. No pneumatosis or portal venous gas. Appendix unremarkable.  Vascular/Lymphatic: Atherosclerosis is present, including aortoiliac atherosclerotic disease. Atheromatous plaque dorsally at the origin of the celiac artery may cause stenosis but no overt occlusion. Lesser degree of atheromatous plaque proximally in the superior mesenteric artery.   Reproductive: Unremarkable   Other: Trace free pelvic fluid adjacent to the pelvic floor, image 74 series 2.   Musculoskeletal: Spurring of both hips and femoral heads. Mild levoconvex lumbar scoliosis. Postoperative findings at L4-5.   IMPRESSION: 1. The previous substantial prominence of stool in the colon has resolved. No dilated small bowel to indicate obstruction. 2. 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 As noted on the 04/02/2024 exam, a follow up pancreatic protocol MRI with and without contrast is recommended in 6 months time. 3. 2 mm left kidney lower pole nonobstructive renal calculus. 4. Trace free pelvic fluid adjacent to the pelvic floor. 5.  Aortic Atherosclerosis   CTAP with contrast 04/02/2024: CT ABDOMEN AND PELVIS WITH CONTRAST   TECHNIQUE: Multidetector CT imaging of the abdomen and pelvis was performed using the standard protocol following bolus administration of intravenous contrast.   RADIATION DOSE REDUCTION: This exam was performed according to the departmental dose-optimization program which includes automated exposure control, adjustment of the mA and/or kV according to patient size and/or use of iterative reconstruction technique.   CONTRAST:  OMNIPAQUE  IOHEXOL  300 MG/ML  SOLN   COMPARISON:  Abdominal radiograph 03/28/2024   FINDINGS: Lower chest: No acute abnormality.   Hepatobiliary: No focal liver abnormality is seen. No gallstones, gallbladder wall thickening, or biliary dilatation.   Pancreas: No acute abnormality. 1.5 cm cystic lesion in the pancreatic head (series 2/image 30).   Spleen: No acute abnormality.   Adrenals/Urinary Tract: Normal adrenal glands. No urinary calculi or hydronephrosis. Unremarkable bladder.   Stomach/Bowel: Large colonic stool burden. Fecalization of the terminal ileum compatible with slow transit. No evidence of obstruction. No bowel wall thickening. Stomach is within normal limits. The appendix is not definitively visualized. No secondary signs of appendicitis.   Vascular/Lymphatic: Aortic atherosclerosis. No enlarged abdominal or pelvic lymph nodes.   Reproductive: No acute abnormality.   Other: No free intraperitoneal fluid or air.   Musculoskeletal: No acute fracture.  Posterior fusion L4-L5.   IMPRESSION: 1. Large colonic stool burden. Fecalization of the terminal ileum compatible  with slow transit/constipation. No evidence of obstruction. 2. 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 Follow-up contrast-enhanced MRI or pancreas protocol CT in 6 months is recommended. 3. Aortic Atherosclerosis (ICD10-I70.0).       Past Medical History:  Diagnosis Date   Allergy    Anterolisthesis 06/17/2020   Chronic back pain    Herpes zoster without complication 10/09/2020   History of kidney stones    Lumbar facet arthropathy 10/31/2019   Nondisplaced fracture of right radial styloid process, initial encounter for closed fracture 08/06/2018   Osteoporosis    Rheumatoid arthritis (HCC)    Past Surgical History:  Procedure Laterality Date   ANTERIOR LATERAL LUMBAR FUSION WITH PERCUTANEOUS SCREW 1 LEVEL N/A 06/17/2020   Procedure: L4-5 EXTREME LATERAL INTERBODY FUSION(XLIF)/POSTERIOR SPINAL FUSION (PSF);  Surgeon: Jodeen Munch, MD;  Location: [facility];  Service: Neurosurgery;  Laterality: N/A;   BACK SURGERY  12/2022   EPIDURAL BLOCK INJECTION     great toe Left    straightened and pinned   LUMBAR LAMINECTOMY/DECOMPRESSION MICRODISCECTOMY Right 06/17/2020   Procedure: RIGHT L2-3 MICRODISCECTOMY;  Surgeon: Jodeen Munch, MD;  Location: [facility];  Service: Neurosurgery;  Laterality: Right;   NECK SURGERY     ROTATOR CUFF REPAIR Right    Social History:  She reports that she has never smoked. She has never used smokeless tobacco. She reports that she does not drink alcohol and does not use drugs.  Family History: family history includes Bone cancer in her brother; Heart attack in her brother and mother; Mental illness in her mother.  Allergies  Allergen Reactions   Leflunomide Other (See Comments)    GI upset     Methotrexate Other (See Comments)    GI upset      Outpatient Encounter Medications as of 05/20/2024  Medication Sig   acetaminophen  (TYLENOL ) 500 MG tablet Take 2 tablets (1,000 mg total) by mouth every 6 (six)  hours as needed for mild pain.   ciclopirox  (PENLAC ) 8 % solution Apply topically at bedtime. Apply over nail and surrounding skin. Apply daily over previous coat. After seven (7) days, may remove with alcohol and continue cycle. (Patient not taking: Reported on 04/30/2024)   cyanocobalamin  (VITAMIN B12) 1000 MCG tablet Take 1,000 mcg by mouth daily. (Patient not taking: Reported on 04/30/2024)   docusate sodium  (COLACE) 100 MG capsule Take 1 capsule (100 mg total) by mouth 2 (two) times daily.   ELDERBERRY PO Take 1 tablet by mouth daily. Vitamin C  and zinc    etodolac (LODINE) 400 MG tablet Take 400 mg by mouth daily as needed for mild pain or moderate pain. (Patient not taking: Reported on 04/30/2024)   gabapentin  (NEURONTIN ) 300 MG capsule    ibuprofen  (ADVIL ) 800 MG tablet Take 1 tablet (800 mg total) by mouth every 6 (six) hours as needed.   Multiple Vitamin (MULTIVITAMIN WITH MINERALS) TABS tablet Take 1 tablet by mouth daily. Centrum Silver (Patient not taking: Reported on 04/30/2024)   polyethylene glycol powder (GLYCOLAX /MIRALAX ) 17 GM/SCOOP powder Take 17 g by mouth 2 (two) times daily as needed for moderate constipation.   pravastatin  (PRAVACHOL ) 40 MG tablet Take 1 tablet (40 mg total) by mouth daily. For cholesterol   Upadacitinib ER 15 MG TB24 Take by mouth. (Patient not taking: Reported on 04/30/2024)   vitamin C  (ASCORBIC ACID ) 250 MG tablet Take 250 mg by mouth daily.   Wheat Dextrin (BENEFIBER DRINK MIX) PACK Take 1 packet by mouth daily.   No facility-administered encounter medications on file as of 05/20/2024.   REVIEW OF SYSTEMS:  Gen: Denies fever, sweats or chills. No weight loss.  CV: Denies chest pain, palpitations or edema. Resp: Denies cough, shortness of breath of hemoptysis.  GI: Denies heartburn, dysphagia, stomach or lower abdominal pain. No diarrhea or constipation.  GU: Denies urinary burning, blood in urine, increased urinary frequency or incontinence. MS: Denies joint  pain, muscles aches or weakness. Derm: Denies rash, itchiness, skin lesions or unhealing ulcers. Psych: Denies depression, anxiety, memory loss or confusion. Heme: Denies bruising, easy bleeding. Neuro:  Denies headaches, dizziness or paresthesias. Endo:  Denies any problems with DM, thyroid  or adrenal function.  PHYSICAL EXAM: There were no vitals taken for this visit. General: in no acute distress. Head: Normocephalic and atraumatic. Eyes:  Sclerae non-icteric, conjunctive pink. Ears: Normal auditory acuity. Mouth: Dentition intact. No ulcers or lesions.  Neck: Supple, no lymphadenopathy or thyromegaly.  Lungs: Clear bilaterally to auscultation without wheezes, crackles or rhonchi. Heart: Regular rate and rhythm. No murmur, rub or gallop appreciated.  Abdomen: Soft, nontender, nondistended. No masses. No hepatosplenomegaly.  Normoactive bowel sounds x 4 quadrants.  Rectal:  Musculoskeletal: Symmetrical with no gross deformities. Skin: Warm and dry. No rash or lesions on visible extremities. Extremities: No edema. Neurological: Alert oriented x 4, no focal deficits.  Psychological: Alert and cooperative. Normal mood and affect.  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  -Abdominal MRI/MRCP with and without contrast  -CA 19-9  Rheumatoid arthritis     CC:  Clark, Katherine K, NP

## 2024-05-15 ENCOUNTER — Telehealth: Payer: Self-pay

## 2024-05-15 DIAGNOSIS — M48062 Spinal stenosis, lumbar region with neurogenic claudication: Secondary | ICD-10-CM | POA: Diagnosis not present

## 2024-05-15 DIAGNOSIS — M5126 Other intervertebral disc displacement, lumbar region: Secondary | ICD-10-CM | POA: Diagnosis not present

## 2024-05-15 DIAGNOSIS — M4310 Spondylolisthesis, site unspecified: Secondary | ICD-10-CM | POA: Diagnosis not present

## 2024-05-15 DIAGNOSIS — M47816 Spondylosis without myelopathy or radiculopathy, lumbar region: Secondary | ICD-10-CM | POA: Diagnosis not present

## 2024-05-15 NOTE — Telephone Encounter (Signed)
 Patient is scheduled with Colleen on Monday, 6-23. However she  saw [facility] last month (5-29) [place], Kristen E, [place]  and is scheduled for procedure on 6-20.  Patient's voicemail is full and I was unable to leave a message letting her know we are cancelling her appointment. A MyChart message was sent to patient letting her know.

## 2024-05-17 DIAGNOSIS — K648 Other hemorrhoids: Secondary | ICD-10-CM | POA: Diagnosis not present

## 2024-05-17 DIAGNOSIS — R194 Change in bowel habit: Secondary | ICD-10-CM | POA: Diagnosis not present

## 2024-05-17 DIAGNOSIS — D123 Benign neoplasm of transverse colon: Secondary | ICD-10-CM | POA: Diagnosis not present

## 2024-05-17 LAB — HM COLONOSCOPY

## 2024-05-20 ENCOUNTER — Ambulatory Visit: Admitting: Nurse Practitioner

## 2024-05-23 NOTE — Progress Notes (Signed)
 Chief Complaint  Patient presents with  . Rheumatoid arthritis involving multiple sites with positive    Flare ups in hands for a few weeks.    History of present illness:    Casey [name] is a 78 y.o.female who presents today for follow-up evaluation of active severe Stage 3 rheumatoid arthritis with positive rheumatoid factor. She was last seen 02/23/2024 and was doing poorly with 10/10 all over pain, having taken herself off of rinvoq. We tried for cimzia per patient preference.  She returned 03/07/24 for right knee injection.  04/23/24 she opted against Cimzia due to cost issues, and opted to resume rinvoq.   She last got hand x-rays completed 02/2022. She last completed a DEXA scan more than 2 years ago, but with DEXA ordered.   Casey [name] was diagnosed with rheumatoid arthritis with positive rheumatoid factor in her 20s or 30s by a doctor in [place].   Casey [name] has tried the following rheumatologic medications in the past: - Methotrexate  PO- side effects - Methotrexate subQ - side effects - Arava - cannot recall this, documentation shows GI upset - Plaquenil - She does not recall this - Enbrel - discontinued due to cost - Humira - stopped in 2024 due to inadequate efficacy *- Rinvoq - current since early 08/2023, stopped by patient in 01/2024 due to concerns about constipation. Resumed 03/2024, current. Casey [name] - effective but discontinued due to cost - Cimzia - Authorized but not started due to excessive cost - Alendronate - no longer taking - Forteo - current - Actonel - No longer taking - Etodolac - currently prn - Prednisone  - used chronically in the past, helps as tapers - Gabapentin  - patient discontinued because a friend got dizziness from it  She is doing poorly with buttock pain bilaterally, but notes her hands and feet are not bothering her as much as they were. She has been back on Rinvoq for about 5-6 weeks now and is not noticing any changes  in her constipation or any other side effects. She does not recall us  doing her right knee injection, but notes while both knees hurt her left certainly hurts worse.     Casey [name] is currently being treated with rinvoq.  She has not experienced side effects from the medication.  She has not had any infections since last office visit.    She has not had any other significant health changes or unexplained new symptoms since last visit.   Wetona's work status is retired. She is a never smoker. She drinks an average of 0 servings of alcohol weekly. Anijah reports a family history of RA in her brother sister and father, but denies any family history of known autoimmune disease otherwise.   Review of Systems ROS was negative except as noted above.  Patient Active Problem List   Diagnosis Date Noted  . Hand pain, left 02/10/2020  . Bilateral hand numbness 12/16/2019  . Right wrist pain 08/06/2018  . Nondisplaced fracture of right radial styloid process, initial encounter for closed fracture 08/06/2018  . Hand pain, right 04/14/2016  . Carpal tunnel syndrome of right wrist 03/22/2016  . Rheumatoid arthritis (GWK) 07/29/2014  . Osteoporosis (GWK) 07/29/2014  . Inflammatory eye disease 07/29/2014  . Baker's cyst     Past Medical History:  Diagnosis Date  . Baker's cyst   . Baker's cyst   . Blood clot of neck vein 1971  . Enthesopathy of hip   . Inflammatory eye  disease 07/29/2014  . Lumbago   . Osteoarthrosis   . Osteoporosis (GWK) 07/29/2014   a.  S/p Actonel.   b.  Fosamax.    . Rheumatoid arthritis (GWK) 07/29/2014    a.  Prednisone    b.  S/p Methotrexate oral, intolerant   c.  S/p Enbrel, discontinued because of cost.   d.  Baker's cyst   e.  Unable to afford other TNF drugs.   f.  S/p Arava   g.  Methotrexate injections.   h.  Earma effective but stopped because of cost.    . Rotator cuff tear    Past Surgical History:  Procedure Laterality Date  . L4-5 lateral interbody fusion,  L4-5 posterior fusion, right L2-3 microdiscectomy  06/17/2020   Dr Reeves Daisy at [facility], [place]  . KNEE ARTHROSCOPY Left   . left arm surgery    . MASTECTOMY PARTIAL / LUMPECTOMY    . MASTECTOMY PARTIAL / LUMPECTOMY    . rotator cuff tear surgery     Social History   Socioeconomic History  . Marital status: Married  Tobacco Use  . Smoking status: Never  . Smokeless tobacco: Never  Vaping Use  . Vaping status: Never Used  Substance and Sexual Activity  . Alcohol use: No  . Drug use: No  . Sexual activity: Never    Partners: Male   Social Drivers of Health   Financial Resource Strain: Low Risk  (06/05/2023)   Received [facility]   Overall Financial Resource Strain (CARDIA)   . Difficulty of Paying Living Expenses: Not hard at all  Food Insecurity: No Food Insecurity (06/05/2023)   Received from [facility]   Hunger Vital Sign   . Within the past 12 months, you worried that your food would run out before you got the money to buy more.: Never true   . Within the past 12 months, the food you bought just didn't last and you didn't have money to get more.: Never true  Transportation Needs: No Transportation Needs (06/05/2023)   Received from [facility] - Transportation   . Lack of Transportation (Medical): No   . Lack of Transportation (Non-Medical): No  Physical Activity: Insufficiently Active (06/05/2023)   Received from [facility]   Exercise Vital Sign   . On average, how many days per week do you engage in moderate to strenuous exercise (like a brisk walk)?: 2 days   . On average, how many minutes do you engage in exercise at this level?: 60 min  Stress: No Stress Concern Present (06/05/2023)   Received from [facility] - Occupational Stress Questionnaire   . Feeling of Stress : Not at all  Social Connections: Moderately Integrated (06/05/2023)   Received from [facility]   Social Connection and Isolation Panel   .  In a typical week, how many times do you talk on the phone with family, friends, or neighbors?: More than three times a week   . How often do you get together with friends or relatives?: More than three times a week   . How often do you attend church or religious services?: More than 4 times per year   . Do you belong to any clubs or organizations such as church groups, unions, fraternal or athletic groups, or school groups?: Yes   . How often do you attend meetings of the clubs or organizations you belong to?: More than 4 times per  year   . Are you married, widowed, divorced, separated, never married, or living with a partner?: Widowed  Housing Stability: Unknown (02/21/2024)   Housing Stability Vital Sign   . Homeless in the Last Year: No   Family History  Problem Relation Name Age of Onset  . Cancer Brother    . Heart disease Mother    . Heart disease Father      Physical Exam: BP 136/74   Pulse 72   Ht 162.6 cm (5' 4)   Wt 60.8 kg (134 lb)   BMI 23.00 kg/m   General: Pleasant elderly black woman sitting in chair. Well groomed, no acute distress. Non-toxic appearance.  HEENT: Conjunctivae normal.  Pulmonary: Normal effort of breathing. Symmetric chest expansion.  Musculoskeletal: Tenderness to palpation of MCPs, PIPs, left wrist, knees L>R. Mild swelling noted to left ventral ulnar wrist. Fist formation unimpaired bilaterally. Stable ulnar deviation. Positive squeeze tests to hands and feet. No other tender, synovitic, warm, erythematous, or deformed joints. FROM all joints except as above.  Skin: Skin warm and dry. No rashes appreciable. Neurologic: Oriented to time, person, place, and situation. Normal gait.  Psychological: Normal behavior, thought content, and judgment. Records were reviewed No results found for: RF, CCP Lab Results  Component Value Date/Time   SEDRATE 56 (H) 02/23/2024 11:26 AM   Lab Results  Component Value Date/Time   GLUCOSE 92 08/06/2020 11:08 AM    GLUCOSE 100 07/19/2018 05:48 AM   BUN 18 08/06/2020 11:08 AM   BUN 10 07/19/2018 05:48 AM   CREATININE 0.8 10/31/2023 02:33 PM   CREATININE 0.8 07/19/2018 05:48 AM   NA 142 08/06/2020 11:08 AM   NA 139 07/19/2018 05:48 AM   K 4.3 08/06/2020 11:08 AM   K 4.5 07/19/2018 05:48 AM   CL 107 08/06/2020 11:08 AM   CL 103 07/19/2018 05:48 AM   CO2 28.5 08/06/2020 11:08 AM   CO2 28 07/19/2018 05:48 AM   CALCIUM 9.5 08/06/2020 11:08 AM   CALCIUM 8.8 07/19/2018 05:48 AM   ALB 4.3 10/31/2023 02:33 PM   AGRATIO 1.3 08/06/2020 11:08 AM   ALKPHOS 75 10/31/2023 02:33 PM   AST 30 10/31/2023 02:33 PM   ALT 20 10/31/2023 02:33 PM   Lab Results  Component Value Date/Time   WBC 5.6 10/31/2023 02:33 PM   RBC 3.88 (L) 10/31/2023 02:33 PM   HGB 12.1 10/31/2023 02:33 PM   HCT 35.7 10/31/2023 02:33 PM   MCV 92.0 10/31/2023 02:33 PM   MCH 31.2 10/31/2023 02:33 PM   MCHC 33.9 10/31/2023 02:33 PM   PLT 208 10/31/2023 02:33 PM    Failed to redirect to the Timeline version of the REVFS SmartLink. CDAI is calculated as follows:  SDAI = SJC + TJC +PGA + EGA Where: 1 + 8 + 7 + 4 = 20  SJC = Swollen Joint Count TJC = Tender Joint Count PGA = Patient Global Assessment of Disease Activity EGA = Evaluator Global Assessment of Disease Activity  Interpretation <=2.8: Remission >2.8 and <=10: Low Disease Activity >10 and <=22: Moderate Disease Activity >22: High Disease Activity  Assessment and Plan: Rheumatoid arthritis involving multiple sites with positive rheumatoid factor (CMS/HHS-HCC)  (primary encounter diagnosis)  Encounter for long-term (current) use of high-risk medication   1. Rheumatoid arthritis-severe Stage 3.  Poorly controlled. Rinvoq not yet at full strength, so we will continue rinvoq and assess efficacy next visit.  2. High risk prescription-This patient is on drug therapy requiring intensive monitoring  for toxicity, including regular lab monitoring and screening for serious or  recurrent infections.  Today, I assessed for side effects, infections, new or worsening health conditions that may be related to the medications, and reviewed and interpreted CBC and CMP obtained by outside provider.  Return in about 2 months (around 08/06/2024).     Medication List       * Accurate as of June 05, 2024 11:55 AM. If you have any questions, ask your nurse or doctor.          CONTINUE taking these medications    acetaminophen  500 MG tablet Commonly known as: TYLENOL    alendronate 70 MG tablet Commonly known as: FOSAMAX Take 1 tablet (70 mg total) by mouth every 7 (seven) days Take with a full glass of water. Do not lie down for the next 30 min.   ascorbic acid  (vitamin C ) 500 MG tablet Commonly known as: VITAMIN C    cyclobenzaprine 10 MG tablet Commonly known as: FLEXERIL   docusate 100 MG capsule Commonly known as: COLACE   etodolac 400 MG tablet Commonly known as: LODINE take 1 tablet by mouth twice a day   * gabapentin  300 MG capsule Commonly known as: NEURONTIN  Take 1 capsule (300 mg total) by mouth 3 (three) times daily   * gabapentin  300 MG capsule Commonly known as: NEURONTIN    HUMIRA(CF) PEN 40 mg/0.4 mL pen injector kit Generic drug: adalimumab Inject 40 mg subcutaneously every 14 (fourteen) days   lactulose  10 gram/15 mL oral solution Commonly known as: ENULOSE    lidocaine  5 % patch Commonly known as: LIDODERM    methocarbamoL  500 MG tablet Commonly known as: ROBAXIN  TAKE 1 & 1/2 TABET BY MOUTH AT BEDTIME AS NEEDED   multivitamin tablet   pen needle, diabetic 31 gauge x 3/16 needle Commonly known as: BD ULTRA-FINE MINI PEN NEEDLE Use as directed   pravastatin  40 MG tablet Commonly known as: PRAVACHOL    * teriparatide 20 mcg/dose (657mcg/2.48mL) pen injector Commonly known as: FORTEO   * FORTEO 20 mcg/dose (511mcg/2.24mL) pen injector Generic drug: teriparatide INJECT 20MCG UNDER THE SKIN ONCE DAILY   tiZANidine  4 MG  tablet Commonly known as: ZANAFLEX    * upadacitinib 15 mg extended release tablet Commonly known as: RINVOQ Take 1 tablet (15 mg total) by mouth once daily   * upadacitinib 15 mg extended release tablet Commonly known as: RINVOQ Take 1 tablet (15 mg total) by mouth once daily   * upadacitinib 15 mg extended release tablet Commonly known as: RINVOQ Take 1 tablet (15 mg total) by mouth once daily      * * This list has 7 medication(s) that are the same as other medications prescribed for you. Read the directions carefully, and ask your doctor or other care provider to review them with you.          No orders of the defined types were placed in this encounter.   All new prescription medications, changes in current prescription dosages, and sample medications were discussed with the patient, including patient education, medication name, use, dosage, potential side effects, drug interactions, consequences of not using/taking, and special instructions. Patient expressed understanding. No barriers to adherence. *Some images could not be shown.

## 2024-06-04 DIAGNOSIS — M48062 Spinal stenosis, lumbar region with neurogenic claudication: Secondary | ICD-10-CM | POA: Diagnosis not present

## 2024-06-05 ENCOUNTER — Telehealth: Payer: Self-pay | Admitting: Primary Care

## 2024-06-05 DIAGNOSIS — M0579 Rheumatoid arthritis with rheumatoid factor of multiple sites without organ or systems involvement: Secondary | ICD-10-CM | POA: Diagnosis not present

## 2024-06-05 DIAGNOSIS — Z79899 Other long term (current) drug therapy: Secondary | ICD-10-CM | POA: Diagnosis not present

## 2024-06-05 NOTE — Telephone Encounter (Signed)
 Copied from CRM [id number]. Topic: Appointments - Scheduling Inquiry for Clinic >> Jun 05, 2024  4:22 PM Antwanette L wrote: Reason for CRM: Patient missed her AWV for today at 3pm. Please contact the patient to reschedule.

## 2024-06-17 ENCOUNTER — Encounter: Payer: Self-pay | Admitting: Physician Assistant

## 2024-06-27 ENCOUNTER — Ambulatory Visit (INDEPENDENT_AMBULATORY_CARE_PROVIDER_SITE_OTHER): Admitting: Podiatry

## 2024-06-27 DIAGNOSIS — Z91199 Patient's noncompliance with other medical treatment and regimen due to unspecified reason: Secondary | ICD-10-CM

## 2024-06-27 NOTE — Progress Notes (Signed)
 1. No-show for appointment

## 2024-07-05 ENCOUNTER — Ambulatory Visit (INDEPENDENT_AMBULATORY_CARE_PROVIDER_SITE_OTHER): Admitting: Podiatry

## 2024-07-05 DIAGNOSIS — Z91199 Patient's noncompliance with other medical treatment and regimen due to unspecified reason: Secondary | ICD-10-CM

## 2024-07-05 NOTE — Progress Notes (Signed)
 1. No-show for appointment

## 2024-07-08 ENCOUNTER — Telehealth: Payer: Self-pay

## 2024-07-08 DIAGNOSIS — E2839 Other primary ovarian failure: Secondary | ICD-10-CM

## 2024-07-08 DIAGNOSIS — Z1231 Encounter for screening mammogram for malignant neoplasm of breast: Secondary | ICD-10-CM

## 2024-07-08 NOTE — Telephone Encounter (Signed)
 Noted, orders placed,

## 2024-07-08 NOTE — Telephone Encounter (Signed)
 Copied from CRM [id number]. Topic: Referral - Request for Referral >> Jul 08, 2024  1:57 PM Harlene ORN wrote: Did the patient discuss referral with their provider in the last year? No (If No - schedule appointment) (If Yes - send message)  Appointment offered? No  Type of order/referral and detailed reason for visit: Mammogram  Preference of office, provider, location: n/a  If referral order, have you been seen by this specialty before? Yes (If Yes, this issue or another issue? When? Where?  Can we respond through MyChart? No

## 2024-07-08 NOTE — Telephone Encounter (Signed)
 Spoke with patient, she was referring to the visit her insurance company does once a year to her home. She will reach out to [facility] to discuss arranging this.

## 2024-07-08 NOTE — Addendum Note (Signed)
 Addended by: Timoty Bourke K on: 07/08/2024 06:39 PM   Modules accepted: Orders

## 2024-07-08 NOTE — Telephone Encounter (Signed)
 Unable to reach patient. Unable to leave voicemail, mailbox full.

## 2024-07-08 NOTE — Telephone Encounter (Signed)
 Spoke with patient she is agreeable to having Bone density scan done as well. Prefers [facility]. Info given to patient to call and schedule.

## 2024-07-08 NOTE — Telephone Encounter (Signed)
 Where would she like to have her mammogram done? It looks like she typically goes to [facility]. Does she want to go back or somewhere else?   [facility] in [place] or [facility]?  Also, she is due for a bone density scan. Is she okay to have this done too?  She will have to call them to schedule.

## 2024-07-08 NOTE — Telephone Encounter (Signed)
 Copied from CRM [id number]. Topic: Clinical - Medication Question >> Jul 08, 2024  2:03 PM Harlene ORN wrote: Reason for CRM: Patient called to request Home Health Orders.Please discuss with patient before her next appointment.

## 2024-07-15 ENCOUNTER — Ambulatory Visit: Admitting: Podiatry

## 2024-07-15 ENCOUNTER — Encounter: Payer: Self-pay | Admitting: Podiatry

## 2024-07-15 ENCOUNTER — Ambulatory Visit: Admitting: Primary Care

## 2024-07-15 DIAGNOSIS — M79674 Pain in right toe(s): Secondary | ICD-10-CM

## 2024-07-15 DIAGNOSIS — M79675 Pain in left toe(s): Secondary | ICD-10-CM

## 2024-07-15 DIAGNOSIS — B351 Tinea unguium: Secondary | ICD-10-CM | POA: Diagnosis not present

## 2024-07-19 NOTE — Progress Notes (Signed)
  Subjective:  Patient ID: Elveria CHRISTELLA Sar, female    DOB: May 08, 1946,  MRN: [id number]  Elveria CHRISTELLA Sar presents to clinic today for painful thick toenails that are difficult to trim. Pain interferes with ambulation. Aggravating factors include wearing enclosed shoe gear. Pain is relieved with periodic professional debridement.  Chief Complaint  Patient presents with   RFC     RFC Non diabetic toenail trim. LOV with PCP 04/30/24.   New problem(s): None.   PCP is [name], NP.  Allergies  Allergen Reactions   Leflunomide Other (See Comments)    GI upset     Methotrexate Other (See Comments)    GI upset    Review of Systems: Negative except as noted in the HPI.  Objective: No changes noted in today's physical examination. There were no vitals filed for this visit. IISHA SOYARS is a pleasant 78 y.o. female WD, WN in NAD. AAO x 3.  Vascular Examination: Capillary refill time immediate b/l. Palpable pedal pulses. Pedal hair present b/l. Pedal edema present. No pain with calf compression b/l. Skin temperature gradient WNL b/l. No cyanosis or clubbing b/l. No ischemia or gangrene noted b/l.   Neurological Examination: Sensation grossly intact b/l with 10 gram monofilament. Vibratory sensation intact b/l.   Dermatological Examination: Pedal skin with normal turgor, texture and tone b/l.  No open wounds. No interdigital macerations.   Toenails 1-5 b/l thick, discolored, elongated with subungual debris and pain on dorsal palpation.   No hyperkeratotic nor porokeratotic lesions.  Musculoskeletal Examination: Muscle strength 5/5 to all lower extremity muscle groups bilaterally. HAV with bunion deformity noted b/l LE.SABRA No pain, crepitus or joint limitation noted with ROM b/l LE.  Patient ambulates independently without assistive aids.  Radiographs: None  Assessment/Plan: 1. Pain due to onychomycosis of toenails of both feet   Patient was evaluated and treated. All  patient's and/or POA's questions/concerns addressed on today's visit. Toenails 1-5 debrided in length and girth without incident. Continue soft, supportive shoe gear daily. Report any pedal injuries to medical professional. Call office if there are any questions/concerns. -Patient/POA to call should there be question/concern in the interim.   Return in about 3 months (around 10/15/2024).  Delon LITTIE Merlin, DPM      [place] LOCATION: [address], [place] [address]                   Office [phone]   [facility] LOCATION: [address] [place], [place] [address] Office [phone]

## 2024-08-09 DIAGNOSIS — M5440 Lumbago with sciatica, unspecified side: Secondary | ICD-10-CM | POA: Diagnosis not present

## 2024-08-09 DIAGNOSIS — K862 Cyst of pancreas: Secondary | ICD-10-CM | POA: Diagnosis not present

## 2024-08-09 DIAGNOSIS — G8929 Other chronic pain: Secondary | ICD-10-CM | POA: Diagnosis not present

## 2024-08-09 DIAGNOSIS — K5904 Chronic idiopathic constipation: Secondary | ICD-10-CM | POA: Diagnosis not present

## 2024-08-12 DIAGNOSIS — Z79899 Other long term (current) drug therapy: Secondary | ICD-10-CM | POA: Diagnosis not present

## 2024-08-12 DIAGNOSIS — M0579 Rheumatoid arthritis with rheumatoid factor of multiple sites without organ or systems involvement: Secondary | ICD-10-CM | POA: Diagnosis not present

## 2024-08-12 DIAGNOSIS — R634 Abnormal weight loss: Secondary | ICD-10-CM | POA: Diagnosis not present

## 2024-08-16 ENCOUNTER — Encounter

## 2024-08-22 ENCOUNTER — Ambulatory Visit (INDEPENDENT_AMBULATORY_CARE_PROVIDER_SITE_OTHER)

## 2024-08-22 VITALS — BP 152/80 | Ht 64.0 in | Wt 122.0 lb

## 2024-08-22 DIAGNOSIS — Z0000 Encounter for general adult medical examination without abnormal findings: Secondary | ICD-10-CM

## 2024-08-22 NOTE — Progress Notes (Signed)
 Because this visit was a virtual/telehealth visit,  certain criteria was not obtained, such a blood pressure, CBG if applicable, and timed get up and go. Any medications not marked as taking were not mentioned during the medication reconciliation part of the visit. Any vitals not documented were not able to be obtained due to this being a telehealth visit or patient was unable to self-report a recent blood pressure reading due to a lack of equipment at home via telehealth. Vitals that have been documented are verbally provided by the patient.  This visit was performed by a medical professional under my direct supervision. I was immediately available for consultation/collaboration. I have reviewed and agree with the Annual Wellness Visit documentation.  Subjective:   Casey [name] is a 78 y.o. who presents for a Medicare Wellness preventive visit.  As a reminder, Annual Wellness Visits don't include a physical exam, and some assessments may be limited, especially if this visit is performed virtually. We may recommend an in-person follow-up visit with your provider if needed.  Visit Complete: Virtual I connected with  Casey [name] on 08/22/24 by a audio enabled telemedicine application and verified that I am speaking with the correct person using two identifiers.  Patient Location: Home  Provider Location: Home Office  I discussed the limitations of evaluation and management by telemedicine. The patient expressed understanding and agreed to proceed.  Vital Signs: Because this visit was a virtual/telehealth visit, some criteria may be missing or patient reported. Any vitals not documented were not able to be obtained and vitals that have been documented are patient reported.  VideoDeclined- This patient declined Librarian, academic. Therefore the visit was completed with audio only.  Persons Participating in Visit: Patient.  AWV Questionnaire: No: Patient  Medicare AWV questionnaire was not completed prior to this visit.  Cardiac Risk Factors include: advanced age (>77men, >38 women);dyslipidemia     Objective:    Today's Vitals   08/22/24 1110  BP: (!) 152/80  Weight: 122 lb (55.3 kg)  Height: 5' 4 (1.626 m)   Body mass index is 20.94 kg/m.     08/22/2024   11:10 AM 04/19/2024    9:59 AM 04/02/2024    3:41 PM 03/28/2024    1:43 AM 03/06/2024   11:08 AM 06/05/2023    3:33 PM 01/04/2023   12:16 PM  Advanced Directives  Does Patient Have a Medical Advance Directive? No Yes No No No No Yes  Type of Advance Directive       Living will;Healthcare Power of Attorney  Does patient want to make changes to medical advance directive?       No - Guardian declined  Copy of Healthcare Power of Attorney in Chart?       No - copy requested  Would patient like information on creating a medical advance directive? No - Patient declined    No - Patient declined No - Patient declined     Current Medications (verified) Outpatient Encounter Medications as of 08/22/2024  Medication Sig   acetaminophen  (TYLENOL ) 500 MG tablet Take 2 tablets (1,000 mg total) by mouth every 6 (six) hours as needed for mild pain.   docusate sodium  (COLACE) 100 MG capsule Take 1 capsule (100 mg total) by mouth 2 (two) times daily.   ELDERBERRY PO Take 1 tablet by mouth daily. Vitamin C  and zinc    etodolac (LODINE) 400 MG tablet Take 400 mg by mouth daily as needed for mild pain or moderate  pain.   gabapentin  (NEURONTIN ) 300 MG capsule    ibuprofen  (ADVIL ) 800 MG tablet Take 1 tablet (800 mg total) by mouth every 6 (six) hours as needed.   polyethylene glycol powder (GLYCOLAX /MIRALAX ) 17 GM/SCOOP powder Take 17 g by mouth 2 (two) times daily as needed for moderate constipation.   pravastatin  (PRAVACHOL ) 40 MG tablet Take 1 tablet (40 mg total) by mouth daily. For cholesterol   vitamin C  (ASCORBIC ACID ) 250 MG tablet Take 250 mg by mouth daily.   Wheat Dextrin (BENEFIBER DRINK MIX)  PACK Take 1 packet by mouth daily.   ciclopirox  (PENLAC ) 8 % solution Apply topically at bedtime. Apply over nail and surrounding skin. Apply daily over previous coat. After seven (7) days, may remove with alcohol and continue cycle. (Patient not taking: Reported on 08/22/2024)   cyanocobalamin  (VITAMIN B12) 1000 MCG tablet Take 1,000 mcg by mouth daily. (Patient not taking: Reported on 08/22/2024)   Multiple Vitamin (MULTIVITAMIN WITH MINERALS) TABS tablet Take 1 tablet by mouth daily. Centrum Silver (Patient not taking: Reported on 08/22/2024)   Upadacitinib ER 15 MG TB24 Take by mouth. (Patient not taking: Reported on 08/22/2024)   No facility-administered encounter medications on file as of 08/22/2024.    Allergies (verified) Leflunomide and Methotrexate   History: Past Medical History:  Diagnosis Date   Allergy    Anterolisthesis 06/17/2020   Chronic back pain    Herpes zoster without complication 10/09/2020   History of kidney stones    Lumbar facet arthropathy 10/31/2019   Nondisplaced fracture of right radial styloid process, initial encounter for closed fracture 08/06/2018   Osteoporosis    Rheumatoid arthritis (HCC)    Past Surgical History:  Procedure Laterality Date   ANTERIOR LATERAL LUMBAR FUSION WITH PERCUTANEOUS SCREW 1 LEVEL N/A 06/17/2020   Procedure: L4-5 EXTREME LATERAL INTERBODY FUSION(XLIF)/POSTERIOR SPINAL FUSION (PSF);  Surgeon: Clois Fret, MD;  Location: [facility];  Service: Neurosurgery;  Laterality: N/A;   BACK SURGERY  12/2022   EPIDURAL BLOCK INJECTION     great toe Left    straightened and pinned   LUMBAR LAMINECTOMY/DECOMPRESSION MICRODISCECTOMY Right 06/17/2020   Procedure: RIGHT L2-3 MICRODISCECTOMY;  Surgeon: Clois Fret, MD;  Location: [facility] OR[facility];  Service: Neurosurgery;  Laterality: Right;   NECK SURGERY     ROTATOR CUFF REPAIR Right    Family History  Problem Relation Age of Onset   Heart attack Mother    Mental illness Mother     Bone cancer Brother    Heart attack Brother    Social History   Socioeconomic History   Marital status: Widowed    Spouse name: Not on file   Number of children: Not on file   Years of education: Not on file   Highest education level: Not on file  Occupational History   Not on file  Tobacco Use   Smoking status: Never   Smokeless tobacco: Never  Vaping Use   Vaping status: Never Used  Substance and Sexual Activity   Alcohol use: Never   Drug use: Never   Sexual activity: Not on file  Other Topics Concern   Not on file  Social History Narrative   Not on file   Social Drivers of Health   Financial Resource Strain: Low Risk  (08/22/2024)   Overall Financial Resource Strain (CARDIA)    Difficulty of Paying Living Expenses: Not hard at all  Food Insecurity: No Food Insecurity (08/22/2024)   Hunger Vital Sign    Worried About  Running Out of Food in the Last Year: Never true    Ran Out of Food in the Last Year: Never true  Transportation Needs: No Transportation Needs (08/22/2024)   PRAPARE - Administrator, Civil Service (Medical): No    Lack of Transportation (Non-Medical): No  Physical Activity: Sufficiently Active (08/22/2024)   Exercise Vital Sign    Days of Exercise per Week: 3 days    Minutes of Exercise per Session: 60 min  Stress: No Stress Concern Present (08/22/2024)   Casey [name] of Occupational Health - Occupational Stress Questionnaire    Feeling of Stress: Not at all  Social Connections: Moderately Integrated (08/22/2024)   Social Connection and Isolation Panel    Frequency of Communication with Friends and Family: More than three times a week    Frequency of Social Gatherings with Friends and Family: More than three times a week    Attends Religious Services: More than 4 times per year    Active Member of [employer] or Organizations: Yes    Attends Banker Meetings: More than 4 times per year    Marital Status: Widowed    Tobacco  Counseling Counseling given: Not Answered    Clinical Intake:  Pre-visit preparation completed: Yes  Pain : No/denies pain     BMI - recorded: 20.94 Nutritional Status: BMI of 19-24  Normal Nutritional Risks: None Diabetes: No  Lab Results  Component Value Date   HGBA1C 5.7 04/07/2023     How often do you need to have someone help you when you read instructions, pamphlets, or other written materials from your doctor or pharmacy?: 1 - Never  Interpreter Needed?: No  Information entered by :: Casey [name],CMA   Activities of Daily Living     08/22/2024   11:12 AM  In your present state of health, do you have any difficulty performing the following activities:  Hearing? 0  Vision? 0  Difficulty concentrating or making decisions? 0  Walking or climbing stairs? 0  Dressing or bathing? 0  Doing errands, shopping? 0  Preparing Food and eating ? N  Using the Toilet? N  In the past six months, have you accidently leaked urine? N  Do you have problems with loss of bowel control? N  Managing your Medications? N  Managing your Finances? N  Housekeeping or managing your Housekeeping? N    Patient Care Team: Casey Comer POUR, NP as PCP - General (Internal Medicine)  I have updated your Care Teams any recent Medical Services you may have received from other providers in the past year.     Assessment:   This is a routine wellness examination for Casey [name].  Hearing/Vision screen Hearing Screening - Comments:: No difficulties Vision Screening - Comments:: Patient wears glasses    Goals Addressed             This Visit's Progress    Patient Stated       To completely heal       Depression Screen     08/22/2024   11:14 AM 04/16/2024   12:10 PM 04/09/2024    9:28 AM 06/05/2023    3:33 PM 06/01/2022   11:31 AM 12/14/2021   12:12 PM 05/21/2020    2:03 PM  PHQ 2/9 Scores  PHQ - 2 Score 0 2 1 0 0 0 0  PHQ- 9 Score 0 7 5   0 0    Fall Risk     08/22/2024  11:12 AM 04/30/2024   11:32 AM 04/16/2024   12:10 PM 04/09/2024    9:14 AM 01/12/2024   11:14 AM  Fall Risk   Falls in the past year? 0 0 0 0 0  Number falls in past yr: 0 0 0 0 0  Injury with Fall? 0 0 0 0 0  Risk for fall due to : No Fall Risks No Fall Risks No Fall Risks No Fall Risks No Fall Risks  Follow up Falls evaluation completed Falls evaluation completed Falls evaluation completed Falls evaluation completed Falls evaluation completed    MEDICARE RISK AT HOME:  Medicare Risk at Home Any stairs in or around the home?: No If so, are there any without handrails?: No Home free of loose throw rugs in walkways, pet beds, electrical cords, etc?: Yes Adequate lighting in your home to reduce risk of falls?: Yes Life alert?: No Use of a cane, walker or w/c?: No Grab bars in the bathroom?: Yes Shower chair or bench in shower?: Yes Elevated toilet seat or a handicapped toilet?: Yes  TIMED UP AND GO:  Was the test performed?  No  Cognitive Function: 6CIT completed    05/21/2020    2:05 PM  MMSE - Mini Mental State Exam  Orientation to time 5  Orientation to Place 5  Registration 3  Attention/ Calculation 5  Recall 3  Language- repeat 1        08/22/2024   11:14 AM 06/05/2023    3:36 PM 06/01/2022   11:38 AM  6CIT Screen  What Year? 0 points 0 points 0 points  What month? 0 points 0 points 0 points  What time? 0 points 0 points 0 points  Count back from 20 0 points 0 points 0 points  Months in reverse 0 points 0 points 0 points  Repeat phrase 0 points 4 points 0 points  Total Score 0 points 4 points 0 points    Immunizations Immunization History  Administered Date(s) Administered   Influenza, Seasonal, Injecte, Preservative Fre 09/15/2011   PFIZER(Purple Top)SARS-COV-2 Vaccination 12/23/2019, 01/13/2020   PPD Test 09/16/2015, 09/16/2015   Pneumococcal Conjugate-13 12/22/2016   Pneumococcal Polysaccharide-23 11/29/2011   Tdap 07/16/2018    Screening  Tests Health Maintenance  Topic Date Due   Influenza Vaccine  06/28/2024   COVID-19 Vaccine (3 - Pfizer risk series) 08/28/2024 (Originally 02/10/2020)   Zoster Vaccines- Shingrix (1 of 2) 11/27/2024 (Originally 07/30/1965)   Medicare Annual Wellness (AWV)  08/22/2025   DTaP/Tdap/Td (2 - Td or Tdap) 07/16/2028   Pneumococcal Vaccine: 50+ Years  Completed   DEXA SCAN  Completed   HPV VACCINES  Aged Out   Meningococcal B Vaccine  Aged Out   Mammogram  Discontinued   Colonoscopy  Discontinued    Health Maintenance Items Addressed:  Additional Screening:  Vision Screening: Recommended annual ophthalmology exams for early detection of glaucoma and other disorders of the eye. Is the patient up to date with their annual eye exam?  No  Who is the provider or what is the name of the office in which the patient attends annual eye exams?   Dental Screening: Recommended annual dental exams for proper oral hygiene  Community Resource Referral / Chronic Care Management: CRR required this visit?  No   CCM required this visit?  No   Plan:    I have personally reviewed and noted the following in the patient's chart:   Medical and social history Use of alcohol, tobacco or illicit  drugs  Current medications and supplements including opioid prescriptions. Patient is not currently taking opioid prescriptions. Functional ability and status Nutritional status Physical activity Advanced directives List of other physicians Hospitalizations, surgeries, and ER visits in previous 12 months Vitals Screenings to include cognitive, depression, and falls Referrals and appointments  In addition, I have reviewed and discussed with patient certain preventive protocols, quality metrics, and best practice recommendations. A written personalized care plan for preventive services as well as general preventive health recommendations were provided to patient.   Lyle MARLA Right, [place]   08/22/2024   After  Visit Summary: (MyChart) Due to this being a telephonic visit, the after visit summary with patients personalized plan was offered to patient via MyChart   Notes: Nothing significant to report at this time.

## 2024-08-22 NOTE — Patient Instructions (Signed)
 [name],  Thank you for taking the time for your Medicare Wellness Visit. I appreciate your continued commitment to your health goals. Please review the care plan we discussed, and feel free to reach out if I can assist you further.  Medicare recommends these wellness visits once per year to help you and your care team stay ahead of potential health issues. These visits are designed to focus on prevention, allowing your provider to concentrate on managing your acute and chronic conditions during your regular appointments.  Please note that Annual Wellness Visits do not include a physical exam. Some assessments may be limited, especially if the visit was conducted virtually. If needed, we may recommend a separate in-person follow-up with your provider.  Ongoing Care Seeing your primary care provider every 3 to 6 months helps us  monitor your health and provide consistent, personalized care.   Referrals If a referral was made during today's visit and you haven't received any updates within two weeks, please contact the referred provider directly to check on the status.  Recommended Screenings:  Health Maintenance  Topic Date Due   Medicare Annual Wellness Visit  06/04/2024   Flu Shot  06/28/2024   COVID-19 Vaccine (3 - Pfizer risk series) 08/28/2024*   Zoster (Shingles) Vaccine (1 of 2) 11/27/2024*   DTaP/Tdap/Td vaccine (2 - Td or Tdap) 07/16/2028   Pneumococcal Vaccine for age over 30  Completed   DEXA scan (bone density measurement)  Completed   HPV Vaccine  Aged Out   Meningitis B Vaccine  Aged Out   Breast Cancer Screening  Discontinued   Colon Cancer Screening  Discontinued  *Topic was postponed. The date shown is not the original due date.       08/22/2024   11:10 AM  Advanced Directives  Does Patient Have a Medical Advance Directive? No  Would patient like information on creating a medical advance directive? No - Patient declined   Advance Care Planning is important  because it: Ensures you receive medical care that aligns with your values, goals, and preferences. Provides guidance to your family and loved ones, reducing the emotional burden of decision-making during critical moments.  Vision: Annual vision screenings are recommended for early detection of glaucoma, cataracts, and diabetic retinopathy. These exams can also reveal signs of chronic conditions such as diabetes and high blood pressure.  Dental: Annual dental screenings help detect early signs of oral cancer, gum disease, and other conditions linked to overall health, including heart disease and diabetes.  Please see the attached documents for additional preventive care recommendations.

## 2024-08-29 ENCOUNTER — Encounter

## 2024-08-29 ENCOUNTER — Other Ambulatory Visit

## 2024-09-09 ENCOUNTER — Emergency Department
Admission: EM | Admit: 2024-09-09 | Discharge: 2024-09-09 | Disposition: A | Discharge status: Home / Self Care | Attending: Emergency Medicine | Admitting: Emergency Medicine

## 2024-09-09 ENCOUNTER — Encounter: Payer: Self-pay | Admitting: Emergency Medicine

## 2024-09-09 ENCOUNTER — Emergency Department

## 2024-09-09 ENCOUNTER — Other Ambulatory Visit: Payer: Self-pay

## 2024-09-09 DIAGNOSIS — M545 Low back pain, unspecified: Secondary | ICD-10-CM | POA: Insufficient documentation

## 2024-09-09 DIAGNOSIS — M7122 Synovial cyst of popliteal space [Baker], left knee: Secondary | ICD-10-CM | POA: Insufficient documentation

## 2024-09-09 DIAGNOSIS — M25562 Pain in left knee: Secondary | ICD-10-CM | POA: Diagnosis present

## 2024-09-09 DIAGNOSIS — M79662 Pain in left lower leg: Secondary | ICD-10-CM | POA: Diagnosis not present

## 2024-09-09 DIAGNOSIS — M25462 Effusion, left knee: Secondary | ICD-10-CM | POA: Diagnosis not present

## 2024-09-09 DIAGNOSIS — M7989 Other specified soft tissue disorders: Secondary | ICD-10-CM | POA: Diagnosis not present

## 2024-09-09 DIAGNOSIS — M1712 Unilateral primary osteoarthritis, left knee: Secondary | ICD-10-CM | POA: Diagnosis not present

## 2024-09-09 MED ORDER — OXYCODONE HCL 5 MG PO TABS
5.0000 mg | ORAL_TABLET | Freq: Once | ORAL | Status: AC
  Administered 2024-09-09: 5 mg via ORAL
  Filled 2024-09-09: qty 1

## 2024-09-09 MED ORDER — OXYCODONE HCL 5 MG PO TABS: 5.0000 mg | ORAL_TABLET | Freq: Two times a day (BID) | ORAL | 0 refills | Status: AC | PRN

## 2024-09-09 NOTE — ED Triage Notes (Signed)
 Pt to ED via POV. Pt states that she is having pain and swelling in her left knee and buttock since Wednesday or Thursday. Pt states that she did not fall on the area. Unsure why she is having sx's.

## 2024-09-09 NOTE — Discharge Instructions (Signed)
 If you take the pain medication, be aware that it may make you dizzy, drowsy, or sleepy.  If any of these symptoms occur, please stop taking the medication.  Change positions slowly as you may fall.  Call and schedule follow-up appointment with orthopedics.  Wear the immobilizer when out of bed.  You may remove it when at rest.  Return to the emergency department for symptoms that change, worsen, or for new concerns if you are unable to see your primary care provider or the specialist.

## 2024-09-09 NOTE — ED Provider Notes (Signed)
   [facility] Provider Note    Event Date/Time   First MD Initiated Contact with Patient 09/09/24 1136     (approximate)   History   Knee Pain   HPI  LEESA LEIFHEIT is a 78 y.o. female with history of rheumatoid arthritis, osteoporosis, lumbar degenerative disc disease, chronic pain syndrome and as listed in EMR presents to the emergency department for treatment and evaluation of pain and swelling  left knee to foot and pain in the left buttock since last Wednesday or Thursday.  No known injury. Similar back pain in the past, but not in the knee/leg.      Physical Exam    Vitals:   09/09/24 1106  BP: (!) 176/92  Pulse: (!) 103  Resp: 16  Temp: 98.2 F (36.8 C)  SpO2: 98%    General: Awake, no distress.  CV:  Good peripheral perfusion.  Resp:  Normal effort.  Abd:  No distention.  Other:  Diffuse left lower extremity swelling, no pitting edema. DP pulse 2+.    ED Results / Procedures / Treatments   Labs (all labs ordered are listed, but only abnormal results are displayed)  Labs Reviewed - No data to display   EKG  Not indicated.   RADIOLOGY  Image and radiology report reviewed and interpreted by me. Radiology report consistent with the same.  Baker's cyst measuring 4.2x1.6x4.8cm left popliteal fossa  PROCEDURES:  Critical Care performed: No  Procedures   MEDICATIONS ORDERED IN ED:  Medications  oxyCODONE  (Oxy IR/ROXICODONE ) immediate release tablet 5 mg (5 mg Oral Given 09/09/24 1206)     IMPRESSION / MDM / ASSESSMENT AND PLAN / ED COURSE   I have reviewed the triage note and vital signs. Vital signs indicate hypertension and slight tachycardia   Differential diagnosis includes, but is not limited to, DVT, acute on chronic back pain, gout, knee effusion, osteoarthritis  Patient's presentation is most consistent with acute illness / injury with system symptoms.  78 year old female presenting to the emergency  department for treatment and evaluation of left lower extremity pain and swelling without associated trauma.  She also complaining of acute on chronic left low back pain.  See HPI for further details.  On exam, left lower extremity is diffusely edematous.  No pitting edema noted.  Skin is warm and dry.  She is able to ambulate and bear weight but reports that it is painful.  Plan will be to get venous ultrasound to rule out DVT. No LE weakness, change in bowel/bladder function, sensation loss.  Patient requesting pain medication-Oxy IR 5 mg ordered.  US  shows Baker's cyst. No DVT. Pain medication sent to pharmacy. Medication teaching provided. Home care, outpatient follow up , and ER return precautions discussed.      FINAL CLINICAL IMPRESSION(S) / ED DIAGNOSES   Final diagnoses:  Baker's cyst of knee, left  Acute left-sided low back pain without sciatica     Rx / DC Orders   ED Discharge Orders          Ordered    oxyCODONE  (ROXICODONE ) 5 MG immediate release tablet  Every 12 hours PRN        09/09/24 1429             Note:  This document was prepared using Dragon voice recognition software and may include unintentional dictation errors.   Herlinda Kirk NOVAK, FNP 09/12/24 [id number]    Suzanne Kirsch, MD 09/17/24 [phone]

## 2024-09-10 ENCOUNTER — Ambulatory Visit: Admitting: Family

## 2024-09-16 ENCOUNTER — Encounter: Payer: Self-pay | Admitting: Primary Care

## 2024-09-16 DIAGNOSIS — Z1231 Encounter for screening mammogram for malignant neoplasm of breast: Secondary | ICD-10-CM | POA: Diagnosis not present

## 2024-09-16 LAB — HM MAMMOGRAPHY

## 2024-09-17 DIAGNOSIS — M810 Age-related osteoporosis without current pathological fracture: Secondary | ICD-10-CM | POA: Diagnosis not present

## 2024-09-17 DIAGNOSIS — M0550 Rheumatoid polyneuropathy with rheumatoid arthritis of unspecified site: Secondary | ICD-10-CM | POA: Diagnosis not present

## 2024-09-17 DIAGNOSIS — M519 Unspecified thoracic, thoracolumbar and lumbosacral intervertebral disc disorder: Secondary | ICD-10-CM | POA: Diagnosis not present

## 2024-09-17 DIAGNOSIS — Z79622 Long term (current) use of janus kinase inhibitor: Secondary | ICD-10-CM | POA: Diagnosis not present

## 2024-09-17 DIAGNOSIS — M5440 Lumbago with sciatica, unspecified side: Secondary | ICD-10-CM | POA: Diagnosis not present

## 2024-09-17 DIAGNOSIS — I672 Cerebral atherosclerosis: Secondary | ICD-10-CM | POA: Diagnosis not present

## 2024-09-17 DIAGNOSIS — I700 Atherosclerosis of aorta: Secondary | ICD-10-CM | POA: Diagnosis not present

## 2024-09-17 DIAGNOSIS — R030 Elevated blood-pressure reading, without diagnosis of hypertension: Secondary | ICD-10-CM | POA: Diagnosis not present

## 2024-09-17 DIAGNOSIS — R32 Unspecified urinary incontinence: Secondary | ICD-10-CM | POA: Diagnosis not present

## 2024-09-17 DIAGNOSIS — K581 Irritable bowel syndrome with constipation: Secondary | ICD-10-CM | POA: Diagnosis not present

## 2024-09-17 DIAGNOSIS — E785 Hyperlipidemia, unspecified: Secondary | ICD-10-CM | POA: Diagnosis not present

## 2024-09-17 DIAGNOSIS — D84821 Immunodeficiency due to drugs: Secondary | ICD-10-CM | POA: Diagnosis not present

## 2024-09-17 DIAGNOSIS — M1990 Unspecified osteoarthritis, unspecified site: Secondary | ICD-10-CM | POA: Diagnosis not present

## 2024-09-17 DIAGNOSIS — H269 Unspecified cataract: Secondary | ICD-10-CM | POA: Diagnosis not present

## 2024-09-17 DIAGNOSIS — M4800 Spinal stenosis, site unspecified: Secondary | ICD-10-CM | POA: Diagnosis not present

## 2024-10-01 ENCOUNTER — Telehealth: Payer: Self-pay | Admitting: Primary Care

## 2024-10-01 DIAGNOSIS — K8689 Other specified diseases of pancreas: Secondary | ICD-10-CM

## 2024-10-01 NOTE — Addendum Note (Signed)
 Addended by: Nyelle Wolfson K on: 10/01/2024 04:27 PM   Modules accepted: Orders

## 2024-10-01 NOTE — Telephone Encounter (Signed)
 Spoke with pt relaying Kate's message. Pt agrees to MRI and prefers [place] area.

## 2024-10-01 NOTE — Telephone Encounter (Signed)
 Noted, orders placed.

## 2024-10-01 NOTE — Telephone Encounter (Addendum)
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  Is she willing to have this done? Which location? [place] or [place]?

## 2024-10-10 DIAGNOSIS — Z961 Presence of intraocular lens: Secondary | ICD-10-CM | POA: Diagnosis not present

## 2024-10-10 DIAGNOSIS — H524 Presbyopia: Secondary | ICD-10-CM | POA: Diagnosis not present

## 2024-10-15 ENCOUNTER — Ambulatory Visit
Admission: RE | Admit: 2024-10-15 | Discharge: 2024-10-15 | Disposition: A | Discharge status: Home / Self Care | Source: Ambulatory Visit | Attending: Primary Care | Admitting: Primary Care

## 2024-10-15 ENCOUNTER — Ambulatory Visit: Payer: Self-pay

## 2024-10-15 DIAGNOSIS — K869 Disease of pancreas, unspecified: Secondary | ICD-10-CM | POA: Diagnosis not present

## 2024-10-15 DIAGNOSIS — K8689 Other specified diseases of pancreas: Secondary | ICD-10-CM

## 2024-10-15 MED ORDER — GADOPICLENOL 0.5 MMOL/ML IV SOLN
7.5000 mL | Freq: Once | INTRAVENOUS | Status: AC | PRN
  Administered 2024-10-15: 6 mL via INTRAVENOUS

## 2024-10-15 NOTE — Telephone Encounter (Signed)
 FYI Only or Action Required?: Action required by provider: clinical question for provider and update on patient condition.  Patient was last seen in primary care on 04/30/2024 by [name], NP.  Called Nurse Triage reporting Leg Swelling.  Symptoms began it's been a while.  Symptoms are: gradually worsening.  Triage Disposition: See Physician Within 24 Hours  Patient/caregiver understands and will follow disposition?: No, wishes to speak with PCP      Copied from CRM #[id number]. Topic: Clinical - Red Word Triage >> Oct 15, 2024  1:17 PM Brittany M wrote: Red Word that prompted transfer to Nurse Triage: Patient had an MRI today- both legs are swollen and cramped up.       Reason for Disposition  [1] MODERATE leg swelling (e.g., swelling extends up to knees) AND [2] new-onset or getting worse  Answer Assessment - Initial Assessment Questions Patient declined an appointment and is requesting a medication to help with her leg swelling. Please advise.      1. ONSET: When did the swelling start? (e.g., minutes, hours, days)     It's been a while  2. LOCATION: What part of the leg is swollen?  Are both legs swollen or just one leg?     Bilateral legs, right worse than left  3. SEVERITY: How bad is the swelling? (e.g., localized; mild, moderate, severe)     Moderate to severe  4. REDNESS: Is there redness or signs of infection?     No  5. PAIN: Is the swelling painful to touch? If Yes, ask: How painful is it?   (Scale 1-10; mild, moderate or severe)     Some cramping in legs  6. FEVER: Do you have a fever? If Yes, ask: What is it, how was it measured, and when did it start?      No 7. CAUSE: What do you think is causing the leg swelling?     Unsure, had an MRI today  8. MEDICAL HISTORY: Do you have a history of blood clots (e.g., DVT), cancer, heart failure, kidney disease, or liver failure?     No 9. RECURRENT SYMPTOM: Have you had leg  swelling before? If Yes, ask: When was the last time? What happened that time?     No 10. OTHER SYMPTOMS: Do you have any other symptoms? (e.g., chest pain, difficulty breathing)       No  Protocols used: Leg Swelling and Edema-A-AH

## 2024-10-16 NOTE — Telephone Encounter (Signed)
 For swelling we recommend elevating her legs and compression socks/stockings.  We are happy to see her in the office for further evaluation.

## 2024-10-16 NOTE — Telephone Encounter (Signed)
 Called patient reviewed all information and repeated back to me. Will call if any questions.  ? ?

## 2024-10-17 ENCOUNTER — Encounter: Payer: Self-pay | Admitting: Podiatry

## 2024-10-17 ENCOUNTER — Ambulatory Visit: Admitting: Podiatry

## 2024-10-17 DIAGNOSIS — M79675 Pain in left toe(s): Secondary | ICD-10-CM

## 2024-10-17 DIAGNOSIS — B351 Tinea unguium: Secondary | ICD-10-CM

## 2024-10-17 DIAGNOSIS — M79674 Pain in right toe(s): Secondary | ICD-10-CM

## 2024-10-17 MED ORDER — CICLOPIROX 8 % EX SOLN: CUTANEOUS | 11 refills | Status: AC

## 2024-10-17 NOTE — Progress Notes (Signed)
  Subjective:  Patient ID: Elveria Sar, female    DOB: 1946-03-21,  MRN: [id number]  Myana Schlup presents to clinic today for preventative diabetic foot care for painful elongated mycotic toenails 1-5 bilaterally which are tender when wearing enclosed shoe gear. Pain is relieved with periodic professional debridement. Patient is requesting new prescription for Penlac  Nail Lacquer. Chief Complaint  Patient presents with   Nail Problem    Thick painful toenails, 3 month follow up Medicaid Abn signed today   New problem(s): None.   PCP is [name], NP.  Allergies  Allergen Reactions   Leflunomide Other (See Comments)    GI upset     Methotrexate Other (See Comments)    GI upset    Review of Systems: Negative except as noted in the HPI.  Objective: No changes noted in today's physical examination. There were no vitals filed for this visit. Allyanna Appleman is a pleasant 78 y.o. female WD, WN in NAD. AAO x 3.  Vascular Examination: Capillary refill time immediate b/l. Palpable pedal pulses. Pedal hair present b/l. Pedal edema present. No pain with calf compression b/l. Skin temperature gradient WNL b/l. No cyanosis or clubbing b/l. No ischemia or gangrene noted b/l.   Neurological Examination: Sensation grossly intact b/l with 10 gram monofilament. Vibratory sensation intact b/l.   Dermatological Examination: Pedal skin with normal turgor, texture and tone b/l.  No open wounds. No interdigital macerations.   Toenails 1-5 b/l thick, discolored, elongated with subungual debris and pain on dorsal palpation.   No hyperkeratotic nor porokeratotic lesions.  Musculoskeletal Examination: Muscle strength 5/5 to all lower extremity muscle groups bilaterally. HAV with bunion deformity noted b/l LE.SABRA No pain, crepitus or joint limitation noted with ROM b/l LE.  Patient ambulates independently without assistive aids.  Radiographs: None  Assessment/Plan: 1. Pain due to  onychomycosis of toenails of both feet     Meds ordered this encounter  Medications   ciclopirox  (PENLAC ) 8 % solution    Sig: Apply one coat to toenail qd.  Remove weekly with polish remover.    Dispense:  6.6 mL    Refill:  11  -Patient was evaluated and treated. All patient's and/or POA's questions/concerns addressed on today's visit. Toenails 1-5 b/l debrided in length and girth without incident. Continue soft, supportive shoe gear daily. Report any pedal injuries to medical professional. Call office if there are any questions/concerns. -Rx written for Ciclopirox  Nail Lacquer 8% to be applied to affected toenails once daily and removed once weekly with nail polish remover. -Patient/POA to call should there be question/concern in the interim.   Return in about 3 months (around 01/17/2025).  Delon LITTIE Merlin, DPM      [facility] LOCATION: [address], [place] [address]                   Office [phone]   [facility] LOCATION: [address] [place], [place] [address] Office [phone]

## 2024-10-19 ENCOUNTER — Ambulatory Visit: Payer: Self-pay | Admitting: Primary Care

## 2024-10-21 NOTE — Telephone Encounter (Signed)
 Copied from CRM #[id number]. Topic: Clinical - Lab/Test Results >> Oct 21, 2024 10:46 AM Alfonso HERO wrote: Reason for CRM: patient calling for MRI results she doesn't have access to mychart. Asking for a callback.

## 2024-10-23 NOTE — Telephone Encounter (Signed)
 Casey [name],   The MRI of your abdomen shows the cyst in the head of the pancreas has reduced in size which is good. The radiologist recommends repeat MRI in 2 years. I will make a note, but please also make a note for yourself.    Have a nice afternoon! Mallie Gaskins, NP-C  ------------------------------------ Attempted to contact pt, there was no answer and her VM was full.

## 2024-11-04 ENCOUNTER — Ambulatory Visit: Payer: Self-pay

## 2024-11-04 NOTE — Telephone Encounter (Signed)
 FYI Only or Action Required?: FYI only for provider: appointment scheduled on 12/10.  Patient was last seen in primary care on 04/30/2024 by [name], NP.  Called Nurse Triage reporting Leg Swelling.  Symptoms began several months ago.  Interventions attempted: Nothing.  Symptoms are: unchanged.  Triage Disposition: See PCP Within 2 Weeks  Patient/caregiver understands and will follow disposition?: Yes, will follow disposition  Copied from CRM [id number]. Topic: Clinical - Red Word Triage >> Nov 04, 2024  9:40 AM Franky GRADE wrote: Red Word that prompted transfer to Nurse Triage: Patient is experiencing left leg swelling for sometime now with no improvement. Reason for Disposition  [1] MILD swelling of both ankles (i.e., pedal edema) AND [2] is a chronic symptom (recurrent or ongoing AND present > 4 weeks)  Answer Assessment - Initial Assessment Questions 1. ONSET: When did the swelling start? (e.g., minutes, hours, days)     Awhile 2. LOCATION: What part of the leg is swollen?  Are both legs swollen or just one leg?     L leg 3. SEVERITY: How bad is the swelling? (e.g., localized; mild, moderate, severe)     moderate 4. REDNESS: Is there redness or signs of infection?     denies 5. PAIN: Is the swelling painful to touch? If Yes, ask: How painful is it?   (Scale 1-10; mild, moderate or severe)     It can be severe 6. FEVER: Do you have a fever? If Yes, ask: What is it, how was it measured, and when did it start?      denies 7. CAUSE: What do you think is causing the leg swelling?     unsure 8. MEDICAL HISTORY: Do you have a history of blood clots (e.g., DVT), cancer, heart failure, kidney disease, or liver failure?     denies 9. RECURRENT SYMPTOM: Have you had leg swelling before? If Yes, ask: When was the last time? What happened that time?     denies 10. OTHER SYMPTOMS: Do you have any other symptoms? (e.g., chest pain, difficulty  breathing)       Denies SOB, denies CP  Protocols used: Leg Swelling and Edema-A-AH

## 2024-11-04 NOTE — Telephone Encounter (Signed)
 Noted, will evaluate.

## 2024-11-06 ENCOUNTER — Ambulatory Visit: Admitting: Primary Care

## 2024-11-06 ENCOUNTER — Encounter: Payer: Self-pay | Admitting: Primary Care

## 2024-11-06 ENCOUNTER — Ambulatory Visit: Payer: Self-pay | Admitting: Primary Care

## 2024-11-06 VITALS — BP 126/84 | HR 98 | Temp 97.9°F | Ht 64.0 in | Wt 124.5 lb

## 2024-11-06 DIAGNOSIS — T300 Burn of unspecified body region, unspecified degree: Secondary | ICD-10-CM | POA: Diagnosis not present

## 2024-11-06 DIAGNOSIS — R600 Localized edema: Secondary | ICD-10-CM | POA: Insufficient documentation

## 2024-11-06 LAB — BASIC METABOLIC PANEL WITH GFR
BUN: 17 mg/dL (ref 6–23)
CO2: 29 meq/L (ref 19–32)
Calcium: 9.8 mg/dL (ref 8.4–10.5)
Chloride: 104 meq/L (ref 96–112)
Creatinine, Ser: 0.84 mg/dL (ref 0.40–1.20)
GFR: 65.7 mL/min (ref >60.00)
Glucose, Bld: 82 mg/dL (ref 70–99)
Potassium: 3.9 meq/L (ref 3.5–5.1)
Sodium: 139 meq/L (ref 135–145)

## 2024-11-06 LAB — BRAIN NATRIURETIC PEPTIDE: Pro B Natriuretic peptide (BNP): 20 pg/mL (ref 0.0–100.0)
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
## 2024-11-06 NOTE — Assessment & Plan Note (Signed)
°  Without apparent complications.  Discussed home care instructions including bacitracin , keeping the wound covered and clean. Return precautions provided.

## 2024-11-06 NOTE — Patient Instructions (Signed)
 You will receive a phone call regarding the ultrasound.  Stop by the lab prior to leaving today. I will notify you of your results once received.   Apply bacitracin  to the burn once daily. Keep the burn covered and clean.  It was a pleasure to see you today!

## 2024-11-06 NOTE — Assessment & Plan Note (Signed)
 Does appear mildly larger than right lower extremity.  Discussed differentials with patient today. Will obtain labs including BMP and BNP. Discussed venous ultrasound, although DVT seems less likely based on presentation.  She would like to proceed, orders placed.  We discussed elevation of lower extremities, compression socks. Await results.

## 2024-11-13 ENCOUNTER — Ambulatory Visit: Admission: RE | Admit: 2024-11-13

## 2024-12-13 ENCOUNTER — Other Ambulatory Visit: Payer: Self-pay | Admitting: Family Medicine

## 2024-12-13 DIAGNOSIS — E785 Hyperlipidemia, unspecified: Secondary | ICD-10-CM

## 2024-12-13 NOTE — Telephone Encounter (Signed)
Patient is due for CPE/follow up in February, this will be required prior to any further refills.  Please schedule, thank you!

## 2024-12-30 ENCOUNTER — Ambulatory Visit: Payer: Self-pay

## 2024-12-30 NOTE — Telephone Encounter (Signed)
 FYI Only or Action Required?: FYI only for provider: Urgent Care advised--Patient states she is going to go to the Emergency Room.  Patient was last seen in primary care on 11/06/2024 by [name], NP.  Called Nurse Triage reporting Leg Swelling.  Symptoms began unsure--patient didn't specify.  Interventions attempted: OTC medications: extra strength tylenol  and Prescription medications: patient states her arthritis doctor gave her medicine.  Symptoms are: gradually worsening.  Triage Disposition: See HCP Within 4 Hours (Or PCP Triage)  Patient/caregiver understands and will follow disposition?: Yes        Message from Chasity T sent at 12/30/2024  9:27 AM EST  Reason for Triage: pt is having extreme pain/swelling in her bottom area, hurts to sit down   Reason for Disposition  [1] Thigh, calf, or ankle swelling AND [2] only 1 side  Answer Assessment - Initial Assessment Questions Patient states that her buttocks hurts and it hurts to sit down  She add that she had surgery in the past on her back but this was not recent Patient also states she has swelling in left lower leg & she states that   Patient denies any chest pain or difficult breathing --denies any recent fevers, recent injuries/falls  Patient states it is hard to sit down & she also adds some pain in her right hip area She states she has swelling in her calf muscle Patient was advised that if she is having leg swelling worse on one side of a lower leg it is recommended she be seen within four hours She states she is going to go to the ER She is advised that    Patient states she has been taking Extra Strength Tylenol  Patient states her arthritis doctor gave her some medicine as well  Protocols used: Leg Swelling and Edema-A-AH

## 2024-12-31 NOTE — Telephone Encounter (Signed)
 Noted.

## 2024-12-31 NOTE — Telephone Encounter (Signed)
 Called patient she did not go to ED. States her symptoms are improved. She declined getting seen in the office at this time. She will reach out if she changes her mind or has any other questions. She states that her pain is improved and denies any new or other symptoms at this time.

## 2025-01-16 ENCOUNTER — Ambulatory Visit: Admitting: Podiatry

## 2025-09-02 ENCOUNTER — Ambulatory Visit
